# Patient Record
Sex: Female | Born: 1935 | Race: White | Hispanic: No | Smoking: Current some day smoker
Health system: Southern US, Community
[De-identification: ages and names within clinical notes are randomized; demographics above are authoritative.]

## PROBLEM LIST (undated history)

## (undated) DIAGNOSIS — F439 Reaction to severe stress, unspecified: Secondary | ICD-10-CM

## (undated) DIAGNOSIS — I739 Peripheral vascular disease, unspecified: Secondary | ICD-10-CM

## (undated) DIAGNOSIS — I10 Essential (primary) hypertension: Secondary | ICD-10-CM

## (undated) DIAGNOSIS — E785 Hyperlipidemia, unspecified: Secondary | ICD-10-CM

## (undated) DIAGNOSIS — M545 Low back pain: Secondary | ICD-10-CM

## (undated) DIAGNOSIS — I7090 Unspecified atherosclerosis: Secondary | ICD-10-CM

## (undated) HISTORY — DX: Peripheral vascular disease, unspecified: I73.9

## (undated) HISTORY — DX: Essential (primary) hypertension: I10

## (undated) HISTORY — DX: Hyperlipidemia, unspecified: E78.5

## (undated) HISTORY — PX: OTHER SURGICAL HISTORY: SHX169

## (undated) HISTORY — PX: BACK SURGERY: SHX140

## (undated) HISTORY — PX: NOSE SURGERY: SHX723

## (undated) HISTORY — DX: Unspecified atherosclerosis: I70.90

## (undated) HISTORY — DX: Low back pain: M54.5

## (undated) HISTORY — PX: ABDOMINAL HYSTERECTOMY: SHX81

## (undated) HISTORY — DX: Reaction to severe stress, unspecified: F43.9

---

## 2010-05-30 ENCOUNTER — Encounter (HOSPITAL_COMMUNITY)
Admission: RE | Admit: 2010-05-30 | Discharge: 2010-06-29 | Payer: Self-pay | Source: Home / Self Care | Attending: Neurosurgery | Admitting: Neurosurgery

## 2010-06-24 ENCOUNTER — Encounter: Payer: Self-pay | Admitting: Neurosurgery

## 2010-07-10 ENCOUNTER — Other Ambulatory Visit: Payer: Self-pay | Admitting: Neurosurgery

## 2010-07-10 DIAGNOSIS — R102 Pelvic and perineal pain: Secondary | ICD-10-CM

## 2010-07-10 DIAGNOSIS — M25551 Pain in right hip: Secondary | ICD-10-CM

## 2010-07-14 ENCOUNTER — Ambulatory Visit
Admission: RE | Admit: 2010-07-14 | Discharge: 2010-07-14 | Disposition: A | Discharge status: Home / Self Care | Payer: Medicare Other | Source: Ambulatory Visit | Attending: Neurosurgery | Admitting: Neurosurgery

## 2010-07-14 DIAGNOSIS — R102 Pelvic and perineal pain: Secondary | ICD-10-CM

## 2010-07-14 DIAGNOSIS — M25551 Pain in right hip: Secondary | ICD-10-CM

## 2010-07-14 MED ORDER — GADOBENATE DIMEGLUMINE 529 MG/ML IV SOLN
10.0000 mL | Freq: Once | INTRAVENOUS | Status: AC | PRN
  Administered 2010-07-14: 10 mL via INTRAVENOUS

## 2010-09-05 ENCOUNTER — Other Ambulatory Visit: Payer: Self-pay | Admitting: Neurosurgery

## 2010-09-05 DIAGNOSIS — M545 Low back pain: Secondary | ICD-10-CM

## 2010-09-05 DIAGNOSIS — M25559 Pain in unspecified hip: Secondary | ICD-10-CM

## 2010-09-11 ENCOUNTER — Ambulatory Visit
Admission: RE | Admit: 2010-09-11 | Discharge: 2010-09-11 | Disposition: A | Discharge status: Home / Self Care | Payer: Medicare Other | Source: Ambulatory Visit | Attending: Neurosurgery | Admitting: Neurosurgery

## 2010-09-11 DIAGNOSIS — M25559 Pain in unspecified hip: Secondary | ICD-10-CM

## 2010-09-11 DIAGNOSIS — M545 Low back pain: Secondary | ICD-10-CM

## 2010-10-26 ENCOUNTER — Ambulatory Visit (HOSPITAL_COMMUNITY)
Admission: RE | Admit: 2010-10-26 | Discharge: 2010-10-26 | Disposition: A | Discharge status: Home / Self Care | Payer: Medicare Other | Source: Ambulatory Visit | Attending: Neurosurgery | Admitting: Neurosurgery

## 2010-10-26 ENCOUNTER — Encounter (HOSPITAL_COMMUNITY)
Admission: RE | Admit: 2010-10-26 | Discharge: 2010-10-26 | Disposition: A | Discharge status: Home / Self Care | Payer: Medicare Other | Source: Ambulatory Visit | Attending: Neurosurgery | Admitting: Neurosurgery

## 2010-10-26 ENCOUNTER — Other Ambulatory Visit (HOSPITAL_COMMUNITY): Payer: Self-pay | Admitting: Neurosurgery

## 2010-10-26 DIAGNOSIS — J4489 Other specified chronic obstructive pulmonary disease: Secondary | ICD-10-CM | POA: Insufficient documentation

## 2010-10-26 DIAGNOSIS — M5136 Other intervertebral disc degeneration, lumbar region: Secondary | ICD-10-CM

## 2010-10-26 DIAGNOSIS — F17200 Nicotine dependence, unspecified, uncomplicated: Secondary | ICD-10-CM | POA: Insufficient documentation

## 2010-10-26 DIAGNOSIS — M51379 Other intervertebral disc degeneration, lumbosacral region without mention of lumbar back pain or lower extremity pain: Secondary | ICD-10-CM | POA: Insufficient documentation

## 2010-10-26 DIAGNOSIS — Z01818 Encounter for other preprocedural examination: Secondary | ICD-10-CM | POA: Insufficient documentation

## 2010-10-26 DIAGNOSIS — M949 Disorder of cartilage, unspecified: Secondary | ICD-10-CM | POA: Insufficient documentation

## 2010-10-26 DIAGNOSIS — M5137 Other intervertebral disc degeneration, lumbosacral region: Secondary | ICD-10-CM | POA: Insufficient documentation

## 2010-10-26 DIAGNOSIS — M899 Disorder of bone, unspecified: Secondary | ICD-10-CM | POA: Insufficient documentation

## 2010-10-26 DIAGNOSIS — Z01812 Encounter for preprocedural laboratory examination: Secondary | ICD-10-CM | POA: Insufficient documentation

## 2010-10-26 DIAGNOSIS — I10 Essential (primary) hypertension: Secondary | ICD-10-CM | POA: Insufficient documentation

## 2010-10-26 DIAGNOSIS — J449 Chronic obstructive pulmonary disease, unspecified: Secondary | ICD-10-CM | POA: Insufficient documentation

## 2010-10-26 LAB — BASIC METABOLIC PANEL
BUN: 14 mg/dL (ref 6–23)
CO2: 29 mEq/L (ref 19–32)
Calcium: 9.6 mg/dL (ref 8.4–10.5)
Chloride: 104 mEq/L (ref 96–112)
Sodium: 142 mEq/L (ref 135–145)

## 2010-10-26 LAB — CBC
Hemoglobin: 13.4 g/dL (ref 12.0–15.0)
MCH: 32.2 pg (ref 26.0–34.0)
MCV: 101.2 fL — ABNORMAL HIGH (ref 78.0–100.0)
RBC: 4.16 MIL/uL (ref 3.87–5.11)

## 2010-11-05 ENCOUNTER — Inpatient Hospital Stay (HOSPITAL_COMMUNITY)
Admission: RE | Admit: 2010-11-05 | Discharge: 2010-11-06 | DRG: 491 | Disposition: A | Discharge status: Home / Self Care | Payer: Medicare Other | Source: Ambulatory Visit | Attending: Neurosurgery | Admitting: Neurosurgery

## 2010-11-05 ENCOUNTER — Inpatient Hospital Stay (HOSPITAL_COMMUNITY): Payer: Medicare Other

## 2010-11-05 DIAGNOSIS — M48061 Spinal stenosis, lumbar region without neurogenic claudication: Principal | ICD-10-CM | POA: Diagnosis present

## 2010-11-05 DIAGNOSIS — F17200 Nicotine dependence, unspecified, uncomplicated: Secondary | ICD-10-CM | POA: Diagnosis present

## 2010-11-05 DIAGNOSIS — I10 Essential (primary) hypertension: Secondary | ICD-10-CM | POA: Diagnosis present

## 2010-11-05 DIAGNOSIS — J4489 Other specified chronic obstructive pulmonary disease: Secondary | ICD-10-CM | POA: Diagnosis present

## 2010-11-05 DIAGNOSIS — J449 Chronic obstructive pulmonary disease, unspecified: Secondary | ICD-10-CM | POA: Diagnosis present

## 2010-11-21 NOTE — Op Note (Signed)
Denise Mendoza, Denise Mendoza NO.:  [id number]  MEDICAL RECORD NO.:  [id number]  LOCATION:  3526                         FACILITY:  [facility]  PHYSICIAN:  Donalee Citrin, M.D.        DATE OF BIRTH:  [birth year]  DATE OF PROCEDURE: DATE OF DISCHARGE:                              OPERATIVE REPORT   PREOPERATIVE DIAGNOSIS:  Lumbar spinal stenosis with right-sided L4-5 radiculopathy.  PROCEDURE:  Decompressive laminectomy of L4-5 with microdissection of the right L4 and L5 nerve roots.  SURGEON:  Donalee Citrin, MD  ASSISTANT:  Kathaleen Maser. Pool, MD  ANESTHESIA:  General endotracheal.  HISTORY OF PRESENT ILLNESS:  The patient is a 77 year old female who has had progressive worsening right leg pain predominantly into her hip, buttock down the lateral and posterior thigh in the front of her shin, occasionally top of the foot consistent with both L4 and L5 nerve root pattern.  MRI scan showed lateral restenosis level.  The patient failed all forms of treatment, anti-inflammatories, physical therapy, epidural steroid injections and was recommended decompression, foraminotomies at this level and the risks and benefits of the operation were explained to the patient.  She understood and agreed to proceed forth.  The patient was brought to the OR, was induced general anesthesia.  She was placed prone on the Wilson frame and her back was prepped and draped in usual fashion.  Preop incision was localized at the appropriate level.  So after infiltration of 7 mL of lidocaine with epi, midline incision was made.  Bovie electrocautery was used to take down subcutaneous tissue.  Subperiosteal dissection carried out at lamina of L4 and L5.  Intraoperative x-ray identified the appropriate level so the inferior aspect of L4 medial facet complex and superior aspect of L5 was drilled down with a high-speed drill.  Laminotomy was begun with a 2 and 3-mm Kerrison punch.  There was marked facet  arthropathy and large osteophyte displacing the L5 nerve root.  This was all teased away and bitten away with a similar Kerrison punch decompressing and unroofing the L5 nerve root flushed with pedicle.  The foramen was further unroofed decompressing that nerve root marching superiorly.  Working in the lateral recess, the L4 nerve root was identified, the L4 pedicle was palpated.  There was marked ligamentous hypertrophy displacing the undersurface of the L4 nerve root  This was all teased off from the L4 root with a nerve hook and then removed in a piecemeal fashion with 2 and 3-mm Kerrison punch.  At the end of the foraminotomy, there was no further compression easily accepting hockey stick and coronary dilator down the foramen.  Wound was then copiously irrigated and meticulous hemostasis was maintained.  Gelfoam was overlaid on top of the dura.  The muscle fascia was reapproximated in layers with interrupted Vicryl, and the skin was closed with running 4-0 subcuticular.  Benzoin and Steri-Strips were applied.  The patient went to recovery room in stable condition.          ______________________________ Donalee Citrin, M.D.     GC/MEDQ  D:  11/05/2010  T:  11/05/2010  Job:  [id number]  Electronically Signed by Harrell Lark.D.  on 11/21/2010 08:43:00 AM

## 2010-11-29 NOTE — Discharge Summary (Signed)
  NAMECHARDAY, CAPETILLO NO.:  [id number]  MEDICAL RECORD NO.:  [id number]  LOCATION:  3526                         FACILITY:  [facility]  PHYSICIAN:  Donalee Citrin, M.D.        DATE OF BIRTH:  1932-03-24  DATE OF ADMISSION:  11/05/2010 DATE OF DISCHARGE:  11/06/2010                              DISCHARGE SUMMARY   ADMITTING DIAGNOSIS:  Degenerative disease and lumbar spondylosis with a right L4-L5 radiculopathy.  PROCEDURES: 1. Status post laminectomy and diskectomy. 2. Decompression of the L4 and L5 nerve roots on the right.  HOSPITAL COURSE:  The patient was admitted and immediately went to the operating room and underwent the aforementioned procedure.  Postop, the patient did very well, went to recovery room, and then to the floor.  On the floor, the patient convalesced well.  By her postoperative day #1, was ambulating, voiding spontaneously, pain was well controlled, leg pain was gone, and she is able to be discharged home with approximately 1 week followup.  At the time of discharge, the patient was tolerating regular diet and voiding spontaneously.          ______________________________ Donalee Citrin, M.D.     GC/MEDQ  D:  11/21/2010  T:  11/21/2010  Job:  [id number]  Electronically Signed by Donalee Citrin M.D. on 11/29/2010 03:03:33 PM

## 2011-05-21 ENCOUNTER — Other Ambulatory Visit (HOSPITAL_COMMUNITY): Payer: Self-pay | Admitting: Neurosurgery

## 2011-05-21 DIAGNOSIS — M545 Low back pain: Secondary | ICD-10-CM

## 2011-05-23 ENCOUNTER — Ambulatory Visit (HOSPITAL_COMMUNITY)
Admission: RE | Admit: 2011-05-23 | Discharge: 2011-05-23 | Disposition: A | Discharge status: Home / Self Care | Payer: Medicare Other | Source: Ambulatory Visit | Attending: Neurosurgery | Admitting: Neurosurgery

## 2011-05-23 DIAGNOSIS — M545 Low back pain, unspecified: Secondary | ICD-10-CM | POA: Insufficient documentation

## 2011-05-23 DIAGNOSIS — M5137 Other intervertebral disc degeneration, lumbosacral region: Secondary | ICD-10-CM | POA: Insufficient documentation

## 2011-05-23 DIAGNOSIS — M51379 Other intervertebral disc degeneration, lumbosacral region without mention of lumbar back pain or lower extremity pain: Secondary | ICD-10-CM | POA: Insufficient documentation

## 2011-05-23 MED ORDER — GADOBENATE DIMEGLUMINE 529 MG/ML IV SOLN
10.0000 mL | Freq: Once | INTRAVENOUS | Status: AC | PRN
  Administered 2011-05-23: 10 mL via INTRAVENOUS

## 2011-10-10 ENCOUNTER — Other Ambulatory Visit: Payer: Self-pay | Admitting: Neurosurgery

## 2011-10-10 DIAGNOSIS — M545 Low back pain: Secondary | ICD-10-CM

## 2011-10-19 ENCOUNTER — Ambulatory Visit
Admission: RE | Admit: 2011-10-19 | Discharge: 2011-10-19 | Disposition: A | Discharge status: Home / Self Care | Payer: Medicare Other | Source: Ambulatory Visit | Attending: Neurosurgery | Admitting: Neurosurgery

## 2011-10-19 DIAGNOSIS — M545 Low back pain: Secondary | ICD-10-CM

## 2012-03-03 DIAGNOSIS — F439 Reaction to severe stress, unspecified: Secondary | ICD-10-CM

## 2012-03-03 DIAGNOSIS — M545 Low back pain, unspecified: Secondary | ICD-10-CM

## 2012-03-03 HISTORY — DX: Low back pain, unspecified: M54.50

## 2012-03-03 HISTORY — DX: Reaction to severe stress, unspecified: F43.9

## 2012-04-01 ENCOUNTER — Encounter (INDEPENDENT_AMBULATORY_CARE_PROVIDER_SITE_OTHER): Payer: Medicare Other | Admitting: *Deleted

## 2012-04-01 DIAGNOSIS — M79609 Pain in unspecified limb: Secondary | ICD-10-CM

## 2012-04-16 ENCOUNTER — Other Ambulatory Visit: Payer: Self-pay | Admitting: *Deleted

## 2012-04-16 DIAGNOSIS — I739 Peripheral vascular disease, unspecified: Secondary | ICD-10-CM

## 2012-04-24 ENCOUNTER — Encounter: Payer: Self-pay | Admitting: Vascular Surgery

## 2012-05-11 ENCOUNTER — Encounter: Payer: Self-pay | Admitting: Vascular Surgery

## 2012-05-12 ENCOUNTER — Encounter: Payer: Self-pay | Admitting: Vascular Surgery

## 2012-05-12 ENCOUNTER — Encounter (INDEPENDENT_AMBULATORY_CARE_PROVIDER_SITE_OTHER): Payer: Medicare Other | Admitting: *Deleted

## 2012-05-12 ENCOUNTER — Ambulatory Visit (INDEPENDENT_AMBULATORY_CARE_PROVIDER_SITE_OTHER): Payer: Medicare Other | Admitting: Vascular Surgery

## 2012-05-12 VITALS — BP 145/69 | HR 75 | Resp 18 | Ht 61.5 in | Wt 102.0 lb

## 2012-05-12 DIAGNOSIS — I739 Peripheral vascular disease, unspecified: Secondary | ICD-10-CM

## 2012-05-12 DIAGNOSIS — M79604 Pain in right leg: Secondary | ICD-10-CM | POA: Insufficient documentation

## 2012-05-12 DIAGNOSIS — M25559 Pain in unspecified hip: Secondary | ICD-10-CM | POA: Insufficient documentation

## 2012-05-12 DIAGNOSIS — M79605 Pain in left leg: Secondary | ICD-10-CM | POA: Insufficient documentation

## 2012-05-12 DIAGNOSIS — M79609 Pain in unspecified limb: Secondary | ICD-10-CM

## 2012-05-12 NOTE — Progress Notes (Signed)
[facility]  HISTORY AND PHYSICAL   CC: Bilateral hip and leg pain Referring Physician: Dr. Wynetta Emery  History of Present Illness: LANESHIA PINA is a 78 y.o. female with history of LBP, L 4-5 lumbar decompression and multiple steroid injections to Lumbar region by Dr Wynetta Emery on 11/05/10 who continues to C/O Bilateral leg and hip pain. Pt states this occurs with pressure on one hip or the other and radiates down her legs. She will get pain when she rises from prone position to walk. She denies claudication, night or rest pain. She is here today for vascular lab studies and exam to R/O PVD.    No results found.  Past Medical History  Diagnosis Date  . Occlusive disease, arterial   . Peripheral vascular disease   . Stress Oct. 2013    Dr. Ignatius Specking  . Lower back pain Oct 2013  . Hyperlipidemia   . Hypertension     ROS: [x]  Positive   [ ]  Denies    General: [ ]  Weight loss, [ ]  Fever, [ ]  chills Neurologic: [ ]  Dizziness, [ ]  Blackouts, [ ]  Seizure [ ]  Stroke, [ ]  "Mini stroke", [ ]  Slurred speech, [ ]  Temporary blindness; [ ]  weakness in arms or legs, [ ]  Hoarseness Cardiac: [ ]  Chest pain/pressure, [ ]  Shortness of breath at rest [ ]  Shortness of breath with exertion, [ ]  Atrial fibrillation or irregular heartbeat Vascular: [x ] Pain in legs with walking, [ ]  Pain in legs at rest, [ ]  Pain in legs at night,  [ ]  Non-healing ulcer, [ ]  Blood clot in vein/DVT,   Pulmonary: [ ]  Home oxygen, [ ]  Productive cough, [ ]  Coughing up blood, [ ]  Asthma,  [ ]  Wheezing Musculoskeletal:  [ ]  Arthritis, [x ] Low back pain, [x ] Joint pain Hematologic: [ ]  Easy Bruising, [ ]  Anemia; [ ]  Hepatitis Gastrointestinal: [ ]  Blood in stool, [ ]  Gastroesophageal Reflux/heartburn, [ ]  Trouble swallowing Urinary: [ ]  chronic Kidney disease, [ ]  on HD - [ ]  MWF or [ ]  TTHS, [ ]  Burning with urination, [ ]  Difficulty urinating Skin: [ ]  Rashes, [ ]  Wounds Psychological: [ ]   Anxiety, [ ]  Depression   Social History History  Substance Use Topics  . Smoking status: Current Every Day Smoker -- 1.0 packs/day for 30 years    Types: Cigarettes  . Smokeless tobacco: Never Used  . Alcohol Use: No    Family History Family History  Problem Relation Age of Onset  . Heart disease Mother   . Other Father     MACULARDEGENERATION    No Known Allergies  Current Outpatient Prescriptions  Medication Sig Dispense Refill  . ALPRAZolam (XANAX) 0.25 MG tablet Take 1 mg by mouth at bedtime as needed.      . Cholecalciferol 2000 UNITS TABS Take by mouth.      Marland Kitchen HYDROcodone-acetaminophen (LORTAB) 7.5-500 MG per tablet Take 1 tablet by mouth every 6 (six) hours as needed.      . metoprolol tartrate (LOPRESSOR) 25 MG tablet Take 25 mg by mouth 2 (two) times daily.      . simvastatin (ZOCOR) 40 MG tablet Take 40 mg by mouth every evening.      . gabapentin (NEURONTIN) 300 MG capsule Take 300 mg by mouth 3 (three) times daily.      . traZODone (DESYREL) 50 MG tablet Take 50 mg by mouth at bedtime.      Marland Kitchen  zoledronic acid (RECLAST) 5 MG/100ML SOLN Inject 5 mg into the vein once.        Physical Examination  Filed Vitals:   05/12/12 1344  BP: 145/69  Pulse: 75  Resp: 18    Body mass index is 18.96 kg/(m^2).  General:  WDWN in NAD Gait: Normal HENT: WNL Eyes: Pupils equal Pulmonary: normal non-labored breathing , without Rales, rhonchi,  wheezing Cardiac: RRR, without  Murmurs, rubs or gallops; No carotid bruits noted bilat. Abdomen: soft, NT, no masses Skin: no rashes, ulcers noted Vascular Exam/Pulses: 2+ palpable Femoral, popliteal, PT bilaterally   Extremities without ischemic changes, no Gangrene , no cellulitis; no open wounds;  Musculoskeletal: no muscle wasting or atrophy  Neurologic: A&O X 3; Appropriate Affect ; SENSATION: normal; MOTOR FUNCTION:  moving all extremities equally. Speech is fluent/normal  Non-Invasive Vascular Imaging: ABI's 05/12/12  > 1.0 bilaterally with triphasic flow in DP and PT arteries  04/01/12 arterial duplex: widely patent RLE/LLE arterial systems <50% mid- distal left SFA stenosis Turbulent flow in distal EIA may be suggestive of more proximal disease  ASSESSMENT: CHRYSTEL BAREFIELD is a 78 y.o. female, smoker with normal flow in arterial system in BLE per arterial duplex, ABI's and exam. Back, Hip and leg pain appear to be secondary to lumbar stenosis  PLAN: Encourage pt to quit smoking F/U prn Return to Dr Wynetta Emery for further W/U

## 2015-07-06 DIAGNOSIS — H353222 Exudative age-related macular degeneration, left eye, with inactive choroidal neovascularization: Secondary | ICD-10-CM | POA: Diagnosis not present

## 2015-07-06 DIAGNOSIS — H35372 Puckering of macula, left eye: Secondary | ICD-10-CM | POA: Diagnosis not present

## 2015-07-06 DIAGNOSIS — H31019 Macula scars of posterior pole (postinflammatory) (post-traumatic), unspecified eye: Secondary | ICD-10-CM | POA: Diagnosis not present

## 2015-07-06 DIAGNOSIS — H353134 Nonexudative age-related macular degeneration, bilateral, advanced atrophic with subfoveal involvement: Secondary | ICD-10-CM | POA: Diagnosis not present

## 2015-08-01 DIAGNOSIS — F17200 Nicotine dependence, unspecified, uncomplicated: Secondary | ICD-10-CM | POA: Diagnosis not present

## 2015-08-01 DIAGNOSIS — I10 Essential (primary) hypertension: Secondary | ICD-10-CM | POA: Diagnosis not present

## 2015-08-01 DIAGNOSIS — G8929 Other chronic pain: Secondary | ICD-10-CM | POA: Diagnosis not present

## 2015-08-01 DIAGNOSIS — M545 Low back pain: Secondary | ICD-10-CM | POA: Diagnosis not present

## 2015-08-10 DIAGNOSIS — E7800 Pure hypercholesterolemia, unspecified: Secondary | ICD-10-CM | POA: Diagnosis not present

## 2015-08-10 DIAGNOSIS — I10 Essential (primary) hypertension: Secondary | ICD-10-CM | POA: Diagnosis not present

## 2015-09-19 DIAGNOSIS — I10 Essential (primary) hypertension: Secondary | ICD-10-CM | POA: Diagnosis not present

## 2015-09-19 DIAGNOSIS — E7800 Pure hypercholesterolemia, unspecified: Secondary | ICD-10-CM | POA: Diagnosis not present

## 2015-09-28 DIAGNOSIS — M545 Low back pain: Secondary | ICD-10-CM | POA: Diagnosis not present

## 2015-09-28 DIAGNOSIS — Z7189 Other specified counseling: Secondary | ICD-10-CM | POA: Diagnosis not present

## 2015-09-28 DIAGNOSIS — G8929 Other chronic pain: Secondary | ICD-10-CM | POA: Diagnosis not present

## 2015-09-28 DIAGNOSIS — E7800 Pure hypercholesterolemia, unspecified: Secondary | ICD-10-CM | POA: Diagnosis not present

## 2015-09-28 DIAGNOSIS — Z1389 Encounter for screening for other disorder: Secondary | ICD-10-CM | POA: Diagnosis not present

## 2015-09-28 DIAGNOSIS — Z1211 Encounter for screening for malignant neoplasm of colon: Secondary | ICD-10-CM | POA: Diagnosis not present

## 2015-09-28 DIAGNOSIS — R5383 Other fatigue: Secondary | ICD-10-CM | POA: Diagnosis not present

## 2015-09-28 DIAGNOSIS — Z299 Encounter for prophylactic measures, unspecified: Secondary | ICD-10-CM | POA: Diagnosis not present

## 2015-09-28 DIAGNOSIS — Z0000 Encounter for general adult medical examination without abnormal findings: Secondary | ICD-10-CM | POA: Diagnosis not present

## 2015-09-28 DIAGNOSIS — Z79899 Other long term (current) drug therapy: Secondary | ICD-10-CM | POA: Diagnosis not present

## 2015-10-17 DIAGNOSIS — I10 Essential (primary) hypertension: Secondary | ICD-10-CM | POA: Diagnosis not present

## 2015-10-17 DIAGNOSIS — E7800 Pure hypercholesterolemia, unspecified: Secondary | ICD-10-CM | POA: Diagnosis not present

## 2015-11-09 DIAGNOSIS — I10 Essential (primary) hypertension: Secondary | ICD-10-CM | POA: Diagnosis not present

## 2015-11-09 DIAGNOSIS — E7800 Pure hypercholesterolemia, unspecified: Secondary | ICD-10-CM | POA: Diagnosis not present

## 2015-11-13 DIAGNOSIS — I340 Nonrheumatic mitral (valve) insufficiency: Secondary | ICD-10-CM | POA: Diagnosis not present

## 2015-11-13 DIAGNOSIS — I360 Nonrheumatic tricuspid (valve) stenosis: Secondary | ICD-10-CM | POA: Diagnosis not present

## 2015-11-13 DIAGNOSIS — I350 Nonrheumatic aortic (valve) stenosis: Secondary | ICD-10-CM | POA: Diagnosis not present

## 2015-11-28 DIAGNOSIS — M545 Low back pain: Secondary | ICD-10-CM | POA: Diagnosis not present

## 2015-12-15 DIAGNOSIS — M069 Rheumatoid arthritis, unspecified: Secondary | ICD-10-CM | POA: Diagnosis not present

## 2015-12-15 DIAGNOSIS — I10 Essential (primary) hypertension: Secondary | ICD-10-CM | POA: Diagnosis not present

## 2015-12-15 DIAGNOSIS — E7800 Pure hypercholesterolemia, unspecified: Secondary | ICD-10-CM | POA: Diagnosis not present

## 2016-01-18 DIAGNOSIS — I10 Essential (primary) hypertension: Secondary | ICD-10-CM | POA: Diagnosis not present

## 2016-01-18 DIAGNOSIS — E7800 Pure hypercholesterolemia, unspecified: Secondary | ICD-10-CM | POA: Diagnosis not present

## 2016-01-18 DIAGNOSIS — M069 Rheumatoid arthritis, unspecified: Secondary | ICD-10-CM | POA: Diagnosis not present

## 2016-01-25 DIAGNOSIS — G4700 Insomnia, unspecified: Secondary | ICD-10-CM | POA: Diagnosis not present

## 2016-01-25 DIAGNOSIS — M545 Low back pain: Secondary | ICD-10-CM | POA: Diagnosis not present

## 2016-01-25 DIAGNOSIS — Z6823 Body mass index (BMI) 23.0-23.9, adult: Secondary | ICD-10-CM | POA: Diagnosis not present

## 2016-01-25 DIAGNOSIS — G8929 Other chronic pain: Secondary | ICD-10-CM | POA: Diagnosis not present

## 2016-03-17 DIAGNOSIS — E876 Hypokalemia: Secondary | ICD-10-CM | POA: Diagnosis not present

## 2016-03-17 DIAGNOSIS — E785 Hyperlipidemia, unspecified: Secondary | ICD-10-CM | POA: Diagnosis not present

## 2016-03-17 DIAGNOSIS — N201 Calculus of ureter: Secondary | ICD-10-CM | POA: Diagnosis present

## 2016-03-17 DIAGNOSIS — F419 Anxiety disorder, unspecified: Secondary | ICD-10-CM | POA: Diagnosis not present

## 2016-03-17 DIAGNOSIS — G629 Polyneuropathy, unspecified: Secondary | ICD-10-CM | POA: Diagnosis not present

## 2016-03-17 DIAGNOSIS — M5489 Other dorsalgia: Secondary | ICD-10-CM | POA: Diagnosis present

## 2016-03-17 DIAGNOSIS — H3530 Unspecified macular degeneration: Secondary | ICD-10-CM | POA: Diagnosis not present

## 2016-03-17 DIAGNOSIS — R002 Palpitations: Secondary | ICD-10-CM | POA: Diagnosis present

## 2016-03-17 DIAGNOSIS — N211 Calculus in urethra: Secondary | ICD-10-CM | POA: Diagnosis not present

## 2016-03-17 DIAGNOSIS — R278 Other lack of coordination: Secondary | ICD-10-CM | POA: Diagnosis not present

## 2016-03-17 DIAGNOSIS — N132 Hydronephrosis with renal and ureteral calculous obstruction: Secondary | ICD-10-CM | POA: Diagnosis present

## 2016-03-17 DIAGNOSIS — A419 Sepsis, unspecified organism: Secondary | ICD-10-CM | POA: Diagnosis not present

## 2016-03-17 DIAGNOSIS — M1990 Unspecified osteoarthritis, unspecified site: Secondary | ICD-10-CM | POA: Diagnosis not present

## 2016-03-17 DIAGNOSIS — M6281 Muscle weakness (generalized): Secondary | ICD-10-CM | POA: Diagnosis not present

## 2016-03-17 DIAGNOSIS — R6521 Severe sepsis with septic shock: Secondary | ICD-10-CM | POA: Diagnosis present

## 2016-03-17 DIAGNOSIS — Z955 Presence of coronary angioplasty implant and graft: Secondary | ICD-10-CM | POA: Diagnosis not present

## 2016-03-17 DIAGNOSIS — F5104 Psychophysiologic insomnia: Secondary | ICD-10-CM | POA: Diagnosis not present

## 2016-03-17 DIAGNOSIS — R079 Chest pain, unspecified: Secondary | ICD-10-CM | POA: Diagnosis not present

## 2016-03-17 DIAGNOSIS — R000 Tachycardia, unspecified: Secondary | ICD-10-CM | POA: Diagnosis present

## 2016-03-17 DIAGNOSIS — R109 Unspecified abdominal pain: Secondary | ICD-10-CM | POA: Diagnosis not present

## 2016-03-17 DIAGNOSIS — R918 Other nonspecific abnormal finding of lung field: Secondary | ICD-10-CM | POA: Diagnosis not present

## 2016-03-17 DIAGNOSIS — G8929 Other chronic pain: Secondary | ICD-10-CM | POA: Diagnosis present

## 2016-03-17 DIAGNOSIS — B9620 Unspecified Escherichia coli [E. coli] as the cause of diseases classified elsewhere: Secondary | ICD-10-CM | POA: Diagnosis present

## 2016-03-17 DIAGNOSIS — Z960 Presence of urogenital implants: Secondary | ICD-10-CM | POA: Diagnosis not present

## 2016-03-17 DIAGNOSIS — I712 Thoracic aortic aneurysm, without rupture: Secondary | ICD-10-CM | POA: Diagnosis not present

## 2016-03-17 DIAGNOSIS — R0602 Shortness of breath: Secondary | ICD-10-CM | POA: Diagnosis not present

## 2016-03-17 DIAGNOSIS — N390 Urinary tract infection, site not specified: Secondary | ICD-10-CM | POA: Diagnosis not present

## 2016-03-17 DIAGNOSIS — K567 Ileus, unspecified: Secondary | ICD-10-CM | POA: Diagnosis not present

## 2016-03-17 DIAGNOSIS — M549 Dorsalgia, unspecified: Secondary | ICD-10-CM | POA: Diagnosis not present

## 2016-03-17 DIAGNOSIS — N135 Crossing vessel and stricture of ureter without hydronephrosis: Secondary | ICD-10-CM | POA: Diagnosis not present

## 2016-03-17 DIAGNOSIS — Q625 Duplication of ureter: Secondary | ICD-10-CM | POA: Diagnosis not present

## 2016-03-17 DIAGNOSIS — A4151 Sepsis due to Escherichia coli [E. coli]: Secondary | ICD-10-CM | POA: Diagnosis present

## 2016-03-17 DIAGNOSIS — I509 Heart failure, unspecified: Secondary | ICD-10-CM | POA: Diagnosis not present

## 2016-03-17 DIAGNOSIS — I10 Essential (primary) hypertension: Secondary | ICD-10-CM | POA: Diagnosis not present

## 2016-03-22 DIAGNOSIS — M810 Age-related osteoporosis without current pathological fracture: Secondary | ICD-10-CM | POA: Diagnosis not present

## 2016-03-22 DIAGNOSIS — R278 Other lack of coordination: Secondary | ICD-10-CM | POA: Diagnosis not present

## 2016-03-22 DIAGNOSIS — G629 Polyneuropathy, unspecified: Secondary | ICD-10-CM | POA: Diagnosis not present

## 2016-03-22 DIAGNOSIS — M069 Rheumatoid arthritis, unspecified: Secondary | ICD-10-CM | POA: Diagnosis not present

## 2016-03-22 DIAGNOSIS — I712 Thoracic aortic aneurysm, without rupture: Secondary | ICD-10-CM | POA: Diagnosis not present

## 2016-03-22 DIAGNOSIS — Q625 Duplication of ureter: Secondary | ICD-10-CM | POA: Diagnosis not present

## 2016-03-22 DIAGNOSIS — Z79899 Other long term (current) drug therapy: Secondary | ICD-10-CM | POA: Diagnosis not present

## 2016-03-22 DIAGNOSIS — M6281 Muscle weakness (generalized): Secondary | ICD-10-CM | POA: Diagnosis not present

## 2016-03-22 DIAGNOSIS — M549 Dorsalgia, unspecified: Secondary | ICD-10-CM | POA: Diagnosis not present

## 2016-03-22 DIAGNOSIS — A419 Sepsis, unspecified organism: Secondary | ICD-10-CM | POA: Diagnosis not present

## 2016-03-22 DIAGNOSIS — F17200 Nicotine dependence, unspecified, uncomplicated: Secondary | ICD-10-CM | POA: Diagnosis not present

## 2016-03-22 DIAGNOSIS — Z960 Presence of urogenital implants: Secondary | ICD-10-CM | POA: Diagnosis not present

## 2016-03-22 DIAGNOSIS — E876 Hypokalemia: Secondary | ICD-10-CM | POA: Diagnosis not present

## 2016-03-22 DIAGNOSIS — A4151 Sepsis due to Escherichia coli [E. coli]: Secondary | ICD-10-CM | POA: Diagnosis not present

## 2016-03-22 DIAGNOSIS — E7800 Pure hypercholesterolemia, unspecified: Secondary | ICD-10-CM | POA: Diagnosis not present

## 2016-03-22 DIAGNOSIS — J449 Chronic obstructive pulmonary disease, unspecified: Secondary | ICD-10-CM | POA: Diagnosis not present

## 2016-03-22 DIAGNOSIS — B9620 Unspecified Escherichia coli [E. coli] as the cause of diseases classified elsewhere: Secondary | ICD-10-CM | POA: Diagnosis not present

## 2016-03-22 DIAGNOSIS — Z90710 Acquired absence of both cervix and uterus: Secondary | ICD-10-CM | POA: Diagnosis not present

## 2016-03-22 DIAGNOSIS — F5104 Psychophysiologic insomnia: Secondary | ICD-10-CM | POA: Diagnosis not present

## 2016-03-22 DIAGNOSIS — M5136 Other intervertebral disc degeneration, lumbar region: Secondary | ICD-10-CM | POA: Diagnosis not present

## 2016-03-22 DIAGNOSIS — E785 Hyperlipidemia, unspecified: Secondary | ICD-10-CM | POA: Diagnosis not present

## 2016-03-22 DIAGNOSIS — Z87442 Personal history of urinary calculi: Secondary | ICD-10-CM | POA: Diagnosis not present

## 2016-03-22 DIAGNOSIS — F419 Anxiety disorder, unspecified: Secondary | ICD-10-CM | POA: Diagnosis not present

## 2016-03-22 DIAGNOSIS — M1990 Unspecified osteoarthritis, unspecified site: Secondary | ICD-10-CM | POA: Diagnosis not present

## 2016-03-22 DIAGNOSIS — H3530 Unspecified macular degeneration: Secondary | ICD-10-CM | POA: Diagnosis not present

## 2016-03-22 DIAGNOSIS — H548 Legal blindness, as defined in USA: Secondary | ICD-10-CM | POA: Diagnosis not present

## 2016-03-22 DIAGNOSIS — I10 Essential (primary) hypertension: Secondary | ICD-10-CM | POA: Diagnosis not present

## 2016-03-22 DIAGNOSIS — R6521 Severe sepsis with septic shock: Secondary | ICD-10-CM | POA: Diagnosis not present

## 2016-03-22 DIAGNOSIS — Z8249 Family history of ischemic heart disease and other diseases of the circulatory system: Secondary | ICD-10-CM | POA: Diagnosis not present

## 2016-03-22 DIAGNOSIS — Z7982 Long term (current) use of aspirin: Secondary | ICD-10-CM | POA: Diagnosis not present

## 2016-03-22 DIAGNOSIS — G8929 Other chronic pain: Secondary | ICD-10-CM | POA: Diagnosis not present

## 2016-03-22 DIAGNOSIS — A409 Streptococcal sepsis, unspecified: Secondary | ICD-10-CM | POA: Diagnosis not present

## 2016-03-22 DIAGNOSIS — N390 Urinary tract infection, site not specified: Secondary | ICD-10-CM | POA: Diagnosis not present

## 2016-03-22 DIAGNOSIS — N201 Calculus of ureter: Secondary | ICD-10-CM | POA: Diagnosis not present

## 2016-03-25 DIAGNOSIS — A4151 Sepsis due to Escherichia coli [E. coli]: Secondary | ICD-10-CM | POA: Diagnosis not present

## 2016-03-25 DIAGNOSIS — N390 Urinary tract infection, site not specified: Secondary | ICD-10-CM | POA: Diagnosis not present

## 2016-03-26 DIAGNOSIS — Q625 Duplication of ureter: Secondary | ICD-10-CM | POA: Diagnosis not present

## 2016-03-26 DIAGNOSIS — N201 Calculus of ureter: Secondary | ICD-10-CM | POA: Diagnosis not present

## 2016-03-27 DIAGNOSIS — E7800 Pure hypercholesterolemia, unspecified: Secondary | ICD-10-CM | POA: Diagnosis not present

## 2016-03-27 DIAGNOSIS — Z7982 Long term (current) use of aspirin: Secondary | ICD-10-CM | POA: Diagnosis not present

## 2016-03-27 DIAGNOSIS — Z87442 Personal history of urinary calculi: Secondary | ICD-10-CM | POA: Diagnosis not present

## 2016-03-27 DIAGNOSIS — F17200 Nicotine dependence, unspecified, uncomplicated: Secondary | ICD-10-CM | POA: Diagnosis not present

## 2016-03-27 DIAGNOSIS — M069 Rheumatoid arthritis, unspecified: Secondary | ICD-10-CM | POA: Diagnosis not present

## 2016-03-27 DIAGNOSIS — J449 Chronic obstructive pulmonary disease, unspecified: Secondary | ICD-10-CM | POA: Diagnosis not present

## 2016-03-27 DIAGNOSIS — N201 Calculus of ureter: Secondary | ICD-10-CM | POA: Diagnosis not present

## 2016-03-27 DIAGNOSIS — I10 Essential (primary) hypertension: Secondary | ICD-10-CM | POA: Diagnosis not present

## 2016-03-27 DIAGNOSIS — Z79899 Other long term (current) drug therapy: Secondary | ICD-10-CM | POA: Diagnosis not present

## 2016-03-27 DIAGNOSIS — Z960 Presence of urogenital implants: Secondary | ICD-10-CM | POA: Diagnosis not present

## 2016-03-27 DIAGNOSIS — Z90710 Acquired absence of both cervix and uterus: Secondary | ICD-10-CM | POA: Diagnosis not present

## 2016-03-27 DIAGNOSIS — M1990 Unspecified osteoarthritis, unspecified site: Secondary | ICD-10-CM | POA: Diagnosis not present

## 2016-03-27 DIAGNOSIS — Q625 Duplication of ureter: Secondary | ICD-10-CM | POA: Diagnosis not present

## 2016-04-01 DIAGNOSIS — B9620 Unspecified Escherichia coli [E. coli] as the cause of diseases classified elsewhere: Secondary | ICD-10-CM | POA: Diagnosis not present

## 2016-04-01 DIAGNOSIS — N390 Urinary tract infection, site not specified: Secondary | ICD-10-CM | POA: Diagnosis not present

## 2016-04-01 DIAGNOSIS — M5136 Other intervertebral disc degeneration, lumbar region: Secondary | ICD-10-CM | POA: Diagnosis not present

## 2016-04-01 DIAGNOSIS — A409 Streptococcal sepsis, unspecified: Secondary | ICD-10-CM | POA: Diagnosis not present

## 2016-04-05 DIAGNOSIS — N390 Urinary tract infection, site not specified: Secondary | ICD-10-CM | POA: Diagnosis not present

## 2016-04-05 DIAGNOSIS — F419 Anxiety disorder, unspecified: Secondary | ICD-10-CM | POA: Diagnosis not present

## 2016-04-05 DIAGNOSIS — Z09 Encounter for follow-up examination after completed treatment for conditions other than malignant neoplasm: Secondary | ICD-10-CM | POA: Diagnosis not present

## 2016-04-05 DIAGNOSIS — Z299 Encounter for prophylactic measures, unspecified: Secondary | ICD-10-CM | POA: Diagnosis not present

## 2016-04-05 DIAGNOSIS — M545 Low back pain: Secondary | ICD-10-CM | POA: Diagnosis not present

## 2016-05-03 DIAGNOSIS — M545 Low back pain: Secondary | ICD-10-CM | POA: Diagnosis not present

## 2016-05-03 DIAGNOSIS — Z713 Dietary counseling and surveillance: Secondary | ICD-10-CM | POA: Diagnosis not present

## 2016-05-03 DIAGNOSIS — Z299 Encounter for prophylactic measures, unspecified: Secondary | ICD-10-CM | POA: Diagnosis not present

## 2016-05-03 DIAGNOSIS — Z6822 Body mass index (BMI) 22.0-22.9, adult: Secondary | ICD-10-CM | POA: Diagnosis not present

## 2016-05-07 DIAGNOSIS — I10 Essential (primary) hypertension: Secondary | ICD-10-CM | POA: Diagnosis not present

## 2016-05-07 DIAGNOSIS — M069 Rheumatoid arthritis, unspecified: Secondary | ICD-10-CM | POA: Diagnosis not present

## 2016-05-07 DIAGNOSIS — E7800 Pure hypercholesterolemia, unspecified: Secondary | ICD-10-CM | POA: Diagnosis not present

## 2016-05-31 DIAGNOSIS — M545 Low back pain: Secondary | ICD-10-CM | POA: Diagnosis not present

## 2016-05-31 DIAGNOSIS — Z299 Encounter for prophylactic measures, unspecified: Secondary | ICD-10-CM | POA: Diagnosis not present

## 2016-05-31 DIAGNOSIS — Z6822 Body mass index (BMI) 22.0-22.9, adult: Secondary | ICD-10-CM | POA: Diagnosis not present

## 2016-05-31 DIAGNOSIS — Z713 Dietary counseling and surveillance: Secondary | ICD-10-CM | POA: Diagnosis not present

## 2016-06-05 DIAGNOSIS — E7800 Pure hypercholesterolemia, unspecified: Secondary | ICD-10-CM | POA: Diagnosis not present

## 2016-06-05 DIAGNOSIS — M069 Rheumatoid arthritis, unspecified: Secondary | ICD-10-CM | POA: Diagnosis not present

## 2016-06-05 DIAGNOSIS — I10 Essential (primary) hypertension: Secondary | ICD-10-CM | POA: Diagnosis not present

## 2016-06-28 DIAGNOSIS — Q625 Duplication of ureter: Secondary | ICD-10-CM | POA: Diagnosis not present

## 2016-06-28 DIAGNOSIS — N201 Calculus of ureter: Secondary | ICD-10-CM | POA: Diagnosis not present

## 2016-06-28 DIAGNOSIS — R3915 Urgency of urination: Secondary | ICD-10-CM | POA: Diagnosis not present

## 2016-07-01 DIAGNOSIS — Z6822 Body mass index (BMI) 22.0-22.9, adult: Secondary | ICD-10-CM | POA: Diagnosis not present

## 2016-07-01 DIAGNOSIS — Z299 Encounter for prophylactic measures, unspecified: Secondary | ICD-10-CM | POA: Diagnosis not present

## 2016-07-01 DIAGNOSIS — Z713 Dietary counseling and surveillance: Secondary | ICD-10-CM | POA: Diagnosis not present

## 2016-07-01 DIAGNOSIS — M545 Low back pain: Secondary | ICD-10-CM | POA: Diagnosis not present

## 2016-07-19 DIAGNOSIS — Z6827 Body mass index (BMI) 27.0-27.9, adult: Secondary | ICD-10-CM | POA: Diagnosis not present

## 2016-07-19 DIAGNOSIS — Z299 Encounter for prophylactic measures, unspecified: Secondary | ICD-10-CM | POA: Diagnosis not present

## 2016-07-19 DIAGNOSIS — M545 Low back pain: Secondary | ICD-10-CM | POA: Diagnosis not present

## 2016-07-19 DIAGNOSIS — Z713 Dietary counseling and surveillance: Secondary | ICD-10-CM | POA: Diagnosis not present

## 2016-07-19 DIAGNOSIS — R3911 Hesitancy of micturition: Secondary | ICD-10-CM | POA: Diagnosis not present

## 2016-07-23 DIAGNOSIS — H31019 Macula scars of posterior pole (postinflammatory) (post-traumatic), unspecified eye: Secondary | ICD-10-CM | POA: Diagnosis not present

## 2016-07-23 DIAGNOSIS — H353134 Nonexudative age-related macular degeneration, bilateral, advanced atrophic with subfoveal involvement: Secondary | ICD-10-CM | POA: Diagnosis not present

## 2016-07-23 DIAGNOSIS — H353222 Exudative age-related macular degeneration, left eye, with inactive choroidal neovascularization: Secondary | ICD-10-CM | POA: Diagnosis not present

## 2016-07-23 DIAGNOSIS — H35363 Drusen (degenerative) of macula, bilateral: Secondary | ICD-10-CM | POA: Diagnosis not present

## 2016-07-24 DIAGNOSIS — E7800 Pure hypercholesterolemia, unspecified: Secondary | ICD-10-CM | POA: Diagnosis not present

## 2016-07-24 DIAGNOSIS — M069 Rheumatoid arthritis, unspecified: Secondary | ICD-10-CM | POA: Diagnosis not present

## 2016-07-24 DIAGNOSIS — I10 Essential (primary) hypertension: Secondary | ICD-10-CM | POA: Diagnosis not present

## 2016-07-26 DIAGNOSIS — R079 Chest pain, unspecified: Secondary | ICD-10-CM | POA: Diagnosis not present

## 2016-07-26 DIAGNOSIS — M5030 Other cervical disc degeneration, unspecified cervical region: Secondary | ICD-10-CM | POA: Diagnosis not present

## 2016-07-26 DIAGNOSIS — R42 Dizziness and giddiness: Secondary | ICD-10-CM | POA: Diagnosis not present

## 2016-07-26 DIAGNOSIS — I209 Angina pectoris, unspecified: Secondary | ICD-10-CM | POA: Diagnosis not present

## 2016-07-26 DIAGNOSIS — Z7982 Long term (current) use of aspirin: Secondary | ICD-10-CM | POA: Diagnosis not present

## 2016-07-26 DIAGNOSIS — Z79899 Other long term (current) drug therapy: Secondary | ICD-10-CM | POA: Diagnosis not present

## 2016-07-26 DIAGNOSIS — R002 Palpitations: Secondary | ICD-10-CM | POA: Diagnosis not present

## 2016-07-27 DIAGNOSIS — R42 Dizziness and giddiness: Secondary | ICD-10-CM | POA: Diagnosis not present

## 2016-07-27 DIAGNOSIS — M5030 Other cervical disc degeneration, unspecified cervical region: Secondary | ICD-10-CM | POA: Diagnosis not present

## 2016-07-27 DIAGNOSIS — I209 Angina pectoris, unspecified: Secondary | ICD-10-CM | POA: Diagnosis not present

## 2016-07-27 DIAGNOSIS — R079 Chest pain, unspecified: Secondary | ICD-10-CM | POA: Diagnosis not present

## 2016-07-30 DIAGNOSIS — M545 Low back pain: Secondary | ICD-10-CM | POA: Diagnosis not present

## 2016-07-30 DIAGNOSIS — M9983 Other biomechanical lesions of lumbar region: Secondary | ICD-10-CM | POA: Diagnosis not present

## 2016-07-30 DIAGNOSIS — I77811 Abdominal aortic ectasia: Secondary | ICD-10-CM | POA: Diagnosis not present

## 2016-08-01 DIAGNOSIS — R079 Chest pain, unspecified: Secondary | ICD-10-CM | POA: Diagnosis not present

## 2016-08-01 DIAGNOSIS — Z09 Encounter for follow-up examination after completed treatment for conditions other than malignant neoplasm: Secondary | ICD-10-CM | POA: Diagnosis not present

## 2016-08-01 DIAGNOSIS — M545 Low back pain: Secondary | ICD-10-CM | POA: Diagnosis not present

## 2016-08-01 DIAGNOSIS — Z713 Dietary counseling and surveillance: Secondary | ICD-10-CM | POA: Diagnosis not present

## 2016-08-01 DIAGNOSIS — Z299 Encounter for prophylactic measures, unspecified: Secondary | ICD-10-CM | POA: Diagnosis not present

## 2016-08-01 DIAGNOSIS — Z6822 Body mass index (BMI) 22.0-22.9, adult: Secondary | ICD-10-CM | POA: Diagnosis not present

## 2016-08-13 DIAGNOSIS — I10 Essential (primary) hypertension: Secondary | ICD-10-CM | POA: Diagnosis not present

## 2016-08-13 DIAGNOSIS — E7800 Pure hypercholesterolemia, unspecified: Secondary | ICD-10-CM | POA: Diagnosis not present

## 2016-08-13 DIAGNOSIS — M069 Rheumatoid arthritis, unspecified: Secondary | ICD-10-CM | POA: Diagnosis not present

## 2016-08-16 DIAGNOSIS — G8929 Other chronic pain: Secondary | ICD-10-CM | POA: Diagnosis not present

## 2016-08-16 DIAGNOSIS — M545 Low back pain: Secondary | ICD-10-CM | POA: Diagnosis not present

## 2016-08-16 DIAGNOSIS — Z713 Dietary counseling and surveillance: Secondary | ICD-10-CM | POA: Diagnosis not present

## 2016-08-16 DIAGNOSIS — Z299 Encounter for prophylactic measures, unspecified: Secondary | ICD-10-CM | POA: Diagnosis not present

## 2016-08-16 DIAGNOSIS — G629 Polyneuropathy, unspecified: Secondary | ICD-10-CM | POA: Diagnosis not present

## 2016-08-16 DIAGNOSIS — I10 Essential (primary) hypertension: Secondary | ICD-10-CM | POA: Diagnosis not present

## 2016-08-16 DIAGNOSIS — Z6822 Body mass index (BMI) 22.0-22.9, adult: Secondary | ICD-10-CM | POA: Diagnosis not present

## 2016-09-13 DIAGNOSIS — I10 Essential (primary) hypertension: Secondary | ICD-10-CM | POA: Diagnosis not present

## 2016-09-13 DIAGNOSIS — E7800 Pure hypercholesterolemia, unspecified: Secondary | ICD-10-CM | POA: Diagnosis not present

## 2016-09-13 DIAGNOSIS — M069 Rheumatoid arthritis, unspecified: Secondary | ICD-10-CM | POA: Diagnosis not present

## 2016-09-16 DIAGNOSIS — Z6822 Body mass index (BMI) 22.0-22.9, adult: Secondary | ICD-10-CM | POA: Diagnosis not present

## 2016-09-16 DIAGNOSIS — G4700 Insomnia, unspecified: Secondary | ICD-10-CM | POA: Diagnosis not present

## 2016-09-16 DIAGNOSIS — I10 Essential (primary) hypertension: Secondary | ICD-10-CM | POA: Diagnosis not present

## 2016-09-16 DIAGNOSIS — Z299 Encounter for prophylactic measures, unspecified: Secondary | ICD-10-CM | POA: Diagnosis not present

## 2016-09-16 DIAGNOSIS — M545 Low back pain: Secondary | ICD-10-CM | POA: Diagnosis not present

## 2016-10-15 DIAGNOSIS — E7800 Pure hypercholesterolemia, unspecified: Secondary | ICD-10-CM | POA: Diagnosis not present

## 2016-10-15 DIAGNOSIS — I10 Essential (primary) hypertension: Secondary | ICD-10-CM | POA: Diagnosis not present

## 2016-10-15 DIAGNOSIS — M069 Rheumatoid arthritis, unspecified: Secondary | ICD-10-CM | POA: Diagnosis not present

## 2016-10-16 DIAGNOSIS — M810 Age-related osteoporosis without current pathological fracture: Secondary | ICD-10-CM | POA: Diagnosis not present

## 2016-10-16 DIAGNOSIS — Z7189 Other specified counseling: Secondary | ICD-10-CM | POA: Diagnosis not present

## 2016-10-16 DIAGNOSIS — I10 Essential (primary) hypertension: Secondary | ICD-10-CM | POA: Diagnosis not present

## 2016-10-16 DIAGNOSIS — Z299 Encounter for prophylactic measures, unspecified: Secondary | ICD-10-CM | POA: Diagnosis not present

## 2016-10-16 DIAGNOSIS — Z1389 Encounter for screening for other disorder: Secondary | ICD-10-CM | POA: Diagnosis not present

## 2016-10-16 DIAGNOSIS — F17200 Nicotine dependence, unspecified, uncomplicated: Secondary | ICD-10-CM | POA: Diagnosis not present

## 2016-10-16 DIAGNOSIS — Z0000 Encounter for general adult medical examination without abnormal findings: Secondary | ICD-10-CM | POA: Diagnosis not present

## 2016-10-16 DIAGNOSIS — Z79899 Other long term (current) drug therapy: Secondary | ICD-10-CM | POA: Diagnosis not present

## 2016-10-16 DIAGNOSIS — F419 Anxiety disorder, unspecified: Secondary | ICD-10-CM | POA: Diagnosis not present

## 2016-10-16 DIAGNOSIS — R5383 Other fatigue: Secondary | ICD-10-CM | POA: Diagnosis not present

## 2016-10-16 DIAGNOSIS — Z6822 Body mass index (BMI) 22.0-22.9, adult: Secondary | ICD-10-CM | POA: Diagnosis not present

## 2016-10-16 DIAGNOSIS — E7800 Pure hypercholesterolemia, unspecified: Secondary | ICD-10-CM | POA: Diagnosis not present

## 2016-11-14 DIAGNOSIS — F419 Anxiety disorder, unspecified: Secondary | ICD-10-CM | POA: Diagnosis not present

## 2016-11-14 DIAGNOSIS — G8929 Other chronic pain: Secondary | ICD-10-CM | POA: Diagnosis not present

## 2016-11-14 DIAGNOSIS — Z6822 Body mass index (BMI) 22.0-22.9, adult: Secondary | ICD-10-CM | POA: Diagnosis not present

## 2016-11-14 DIAGNOSIS — I10 Essential (primary) hypertension: Secondary | ICD-10-CM | POA: Diagnosis not present

## 2016-11-14 DIAGNOSIS — Z299 Encounter for prophylactic measures, unspecified: Secondary | ICD-10-CM | POA: Diagnosis not present

## 2016-11-14 DIAGNOSIS — M810 Age-related osteoporosis without current pathological fracture: Secondary | ICD-10-CM | POA: Diagnosis not present

## 2016-11-14 DIAGNOSIS — E7800 Pure hypercholesterolemia, unspecified: Secondary | ICD-10-CM | POA: Diagnosis not present

## 2016-11-14 DIAGNOSIS — Z713 Dietary counseling and surveillance: Secondary | ICD-10-CM | POA: Diagnosis not present

## 2016-11-14 DIAGNOSIS — M545 Low back pain: Secondary | ICD-10-CM | POA: Diagnosis not present

## 2016-11-18 DIAGNOSIS — M069 Rheumatoid arthritis, unspecified: Secondary | ICD-10-CM | POA: Diagnosis not present

## 2016-11-18 DIAGNOSIS — E7800 Pure hypercholesterolemia, unspecified: Secondary | ICD-10-CM | POA: Diagnosis not present

## 2016-11-18 DIAGNOSIS — I10 Essential (primary) hypertension: Secondary | ICD-10-CM | POA: Diagnosis not present

## 2016-12-02 DIAGNOSIS — I350 Nonrheumatic aortic (valve) stenosis: Secondary | ICD-10-CM | POA: Diagnosis not present

## 2016-12-02 DIAGNOSIS — I340 Nonrheumatic mitral (valve) insufficiency: Secondary | ICD-10-CM | POA: Diagnosis not present

## 2016-12-12 DIAGNOSIS — I10 Essential (primary) hypertension: Secondary | ICD-10-CM | POA: Diagnosis not present

## 2016-12-12 DIAGNOSIS — M545 Low back pain: Secondary | ICD-10-CM | POA: Diagnosis not present

## 2016-12-12 DIAGNOSIS — Z299 Encounter for prophylactic measures, unspecified: Secondary | ICD-10-CM | POA: Diagnosis not present

## 2016-12-12 DIAGNOSIS — E7800 Pure hypercholesterolemia, unspecified: Secondary | ICD-10-CM | POA: Diagnosis not present

## 2016-12-12 DIAGNOSIS — Z6822 Body mass index (BMI) 22.0-22.9, adult: Secondary | ICD-10-CM | POA: Diagnosis not present

## 2016-12-12 DIAGNOSIS — G8929 Other chronic pain: Secondary | ICD-10-CM | POA: Diagnosis not present

## 2016-12-12 DIAGNOSIS — G629 Polyneuropathy, unspecified: Secondary | ICD-10-CM | POA: Diagnosis not present

## 2016-12-12 DIAGNOSIS — M810 Age-related osteoporosis without current pathological fracture: Secondary | ICD-10-CM | POA: Diagnosis not present

## 2016-12-12 DIAGNOSIS — G4700 Insomnia, unspecified: Secondary | ICD-10-CM | POA: Diagnosis not present

## 2017-01-13 DIAGNOSIS — F17210 Nicotine dependence, cigarettes, uncomplicated: Secondary | ICD-10-CM | POA: Diagnosis not present

## 2017-01-13 DIAGNOSIS — E7800 Pure hypercholesterolemia, unspecified: Secondary | ICD-10-CM | POA: Diagnosis not present

## 2017-01-13 DIAGNOSIS — Z299 Encounter for prophylactic measures, unspecified: Secondary | ICD-10-CM | POA: Diagnosis not present

## 2017-01-13 DIAGNOSIS — M1990 Unspecified osteoarthritis, unspecified site: Secondary | ICD-10-CM | POA: Diagnosis not present

## 2017-01-13 DIAGNOSIS — Z6823 Body mass index (BMI) 23.0-23.9, adult: Secondary | ICD-10-CM | POA: Diagnosis not present

## 2017-01-13 DIAGNOSIS — F419 Anxiety disorder, unspecified: Secondary | ICD-10-CM | POA: Diagnosis not present

## 2017-01-13 DIAGNOSIS — Z713 Dietary counseling and surveillance: Secondary | ICD-10-CM | POA: Diagnosis not present

## 2017-01-13 DIAGNOSIS — G629 Polyneuropathy, unspecified: Secondary | ICD-10-CM | POA: Diagnosis not present

## 2017-01-13 DIAGNOSIS — M810 Age-related osteoporosis without current pathological fracture: Secondary | ICD-10-CM | POA: Diagnosis not present

## 2017-01-13 DIAGNOSIS — I10 Essential (primary) hypertension: Secondary | ICD-10-CM | POA: Diagnosis not present

## 2017-01-16 DIAGNOSIS — I10 Essential (primary) hypertension: Secondary | ICD-10-CM | POA: Diagnosis not present

## 2017-01-16 DIAGNOSIS — E7800 Pure hypercholesterolemia, unspecified: Secondary | ICD-10-CM | POA: Diagnosis not present

## 2017-01-16 DIAGNOSIS — M069 Rheumatoid arthritis, unspecified: Secondary | ICD-10-CM | POA: Diagnosis not present

## 2017-02-13 DIAGNOSIS — M1990 Unspecified osteoarthritis, unspecified site: Secondary | ICD-10-CM | POA: Diagnosis not present

## 2017-02-13 DIAGNOSIS — Z6822 Body mass index (BMI) 22.0-22.9, adult: Secondary | ICD-10-CM | POA: Diagnosis not present

## 2017-02-13 DIAGNOSIS — F419 Anxiety disorder, unspecified: Secondary | ICD-10-CM | POA: Diagnosis not present

## 2017-02-13 DIAGNOSIS — F17210 Nicotine dependence, cigarettes, uncomplicated: Secondary | ICD-10-CM | POA: Diagnosis not present

## 2017-02-13 DIAGNOSIS — E7800 Pure hypercholesterolemia, unspecified: Secondary | ICD-10-CM | POA: Diagnosis not present

## 2017-02-13 DIAGNOSIS — G629 Polyneuropathy, unspecified: Secondary | ICD-10-CM | POA: Diagnosis not present

## 2017-02-13 DIAGNOSIS — Z713 Dietary counseling and surveillance: Secondary | ICD-10-CM | POA: Diagnosis not present

## 2017-02-13 DIAGNOSIS — M810 Age-related osteoporosis without current pathological fracture: Secondary | ICD-10-CM | POA: Diagnosis not present

## 2017-02-13 DIAGNOSIS — Z299 Encounter for prophylactic measures, unspecified: Secondary | ICD-10-CM | POA: Diagnosis not present

## 2017-02-13 DIAGNOSIS — I10 Essential (primary) hypertension: Secondary | ICD-10-CM | POA: Diagnosis not present

## 2017-03-04 DIAGNOSIS — M069 Rheumatoid arthritis, unspecified: Secondary | ICD-10-CM | POA: Diagnosis not present

## 2017-03-04 DIAGNOSIS — E7800 Pure hypercholesterolemia, unspecified: Secondary | ICD-10-CM | POA: Diagnosis not present

## 2017-03-04 DIAGNOSIS — I10 Essential (primary) hypertension: Secondary | ICD-10-CM | POA: Diagnosis not present

## 2017-03-13 DIAGNOSIS — G4700 Insomnia, unspecified: Secondary | ICD-10-CM | POA: Diagnosis not present

## 2017-03-13 DIAGNOSIS — M1990 Unspecified osteoarthritis, unspecified site: Secondary | ICD-10-CM | POA: Diagnosis not present

## 2017-03-13 DIAGNOSIS — E7800 Pure hypercholesterolemia, unspecified: Secondary | ICD-10-CM | POA: Diagnosis not present

## 2017-03-13 DIAGNOSIS — I10 Essential (primary) hypertension: Secondary | ICD-10-CM | POA: Diagnosis not present

## 2017-03-13 DIAGNOSIS — Z299 Encounter for prophylactic measures, unspecified: Secondary | ICD-10-CM | POA: Diagnosis not present

## 2017-03-13 DIAGNOSIS — Z6823 Body mass index (BMI) 23.0-23.9, adult: Secondary | ICD-10-CM | POA: Diagnosis not present

## 2017-03-13 DIAGNOSIS — G629 Polyneuropathy, unspecified: Secondary | ICD-10-CM | POA: Diagnosis not present

## 2017-04-07 DIAGNOSIS — I10 Essential (primary) hypertension: Secondary | ICD-10-CM | POA: Diagnosis not present

## 2017-04-07 DIAGNOSIS — E7800 Pure hypercholesterolemia, unspecified: Secondary | ICD-10-CM | POA: Diagnosis not present

## 2017-04-07 DIAGNOSIS — M069 Rheumatoid arthritis, unspecified: Secondary | ICD-10-CM | POA: Diagnosis not present

## 2017-04-11 DIAGNOSIS — Z6823 Body mass index (BMI) 23.0-23.9, adult: Secondary | ICD-10-CM | POA: Diagnosis not present

## 2017-04-11 DIAGNOSIS — G8929 Other chronic pain: Secondary | ICD-10-CM | POA: Diagnosis not present

## 2017-04-11 DIAGNOSIS — I10 Essential (primary) hypertension: Secondary | ICD-10-CM | POA: Diagnosis not present

## 2017-04-11 DIAGNOSIS — Z299 Encounter for prophylactic measures, unspecified: Secondary | ICD-10-CM | POA: Diagnosis not present

## 2017-04-11 DIAGNOSIS — E7800 Pure hypercholesterolemia, unspecified: Secondary | ICD-10-CM | POA: Diagnosis not present

## 2017-04-11 DIAGNOSIS — M542 Cervicalgia: Secondary | ICD-10-CM | POA: Diagnosis not present

## 2017-04-11 DIAGNOSIS — M545 Low back pain: Secondary | ICD-10-CM | POA: Diagnosis not present

## 2017-05-08 DIAGNOSIS — I10 Essential (primary) hypertension: Secondary | ICD-10-CM | POA: Diagnosis not present

## 2017-05-08 DIAGNOSIS — M545 Low back pain: Secondary | ICD-10-CM | POA: Diagnosis not present

## 2017-05-08 DIAGNOSIS — Z6823 Body mass index (BMI) 23.0-23.9, adult: Secondary | ICD-10-CM | POA: Diagnosis not present

## 2017-05-08 DIAGNOSIS — E7800 Pure hypercholesterolemia, unspecified: Secondary | ICD-10-CM | POA: Diagnosis not present

## 2017-05-08 DIAGNOSIS — Z299 Encounter for prophylactic measures, unspecified: Secondary | ICD-10-CM | POA: Diagnosis not present

## 2017-05-08 DIAGNOSIS — M810 Age-related osteoporosis without current pathological fracture: Secondary | ICD-10-CM | POA: Diagnosis not present

## 2017-05-08 DIAGNOSIS — G4700 Insomnia, unspecified: Secondary | ICD-10-CM | POA: Diagnosis not present

## 2017-05-08 DIAGNOSIS — G8929 Other chronic pain: Secondary | ICD-10-CM | POA: Diagnosis not present

## 2017-05-20 DIAGNOSIS — M069 Rheumatoid arthritis, unspecified: Secondary | ICD-10-CM | POA: Diagnosis not present

## 2017-05-20 DIAGNOSIS — I10 Essential (primary) hypertension: Secondary | ICD-10-CM | POA: Diagnosis not present

## 2017-05-20 DIAGNOSIS — E7800 Pure hypercholesterolemia, unspecified: Secondary | ICD-10-CM | POA: Diagnosis not present

## 2017-06-04 DIAGNOSIS — M069 Rheumatoid arthritis, unspecified: Secondary | ICD-10-CM | POA: Diagnosis not present

## 2017-06-04 DIAGNOSIS — E7800 Pure hypercholesterolemia, unspecified: Secondary | ICD-10-CM | POA: Diagnosis not present

## 2017-06-04 DIAGNOSIS — I10 Essential (primary) hypertension: Secondary | ICD-10-CM | POA: Diagnosis not present

## 2017-07-09 DIAGNOSIS — G629 Polyneuropathy, unspecified: Secondary | ICD-10-CM | POA: Diagnosis not present

## 2017-07-09 DIAGNOSIS — I10 Essential (primary) hypertension: Secondary | ICD-10-CM | POA: Diagnosis not present

## 2017-07-09 DIAGNOSIS — F17210 Nicotine dependence, cigarettes, uncomplicated: Secondary | ICD-10-CM | POA: Diagnosis not present

## 2017-07-09 DIAGNOSIS — Z299 Encounter for prophylactic measures, unspecified: Secondary | ICD-10-CM | POA: Diagnosis not present

## 2017-07-09 DIAGNOSIS — Z6822 Body mass index (BMI) 22.0-22.9, adult: Secondary | ICD-10-CM | POA: Diagnosis not present

## 2017-07-09 DIAGNOSIS — M545 Low back pain: Secondary | ICD-10-CM | POA: Diagnosis not present

## 2017-07-09 DIAGNOSIS — E7800 Pure hypercholesterolemia, unspecified: Secondary | ICD-10-CM | POA: Diagnosis not present

## 2017-07-09 DIAGNOSIS — G8929 Other chronic pain: Secondary | ICD-10-CM | POA: Diagnosis not present

## 2017-07-15 DIAGNOSIS — I10 Essential (primary) hypertension: Secondary | ICD-10-CM | POA: Diagnosis not present

## 2017-07-15 DIAGNOSIS — E7800 Pure hypercholesterolemia, unspecified: Secondary | ICD-10-CM | POA: Diagnosis not present

## 2017-07-15 DIAGNOSIS — M069 Rheumatoid arthritis, unspecified: Secondary | ICD-10-CM | POA: Diagnosis not present

## 2017-07-22 DIAGNOSIS — H35363 Drusen (degenerative) of macula, bilateral: Secondary | ICD-10-CM | POA: Diagnosis not present

## 2017-07-22 DIAGNOSIS — H31019 Macula scars of posterior pole (postinflammatory) (post-traumatic), unspecified eye: Secondary | ICD-10-CM | POA: Diagnosis not present

## 2017-07-22 DIAGNOSIS — H353134 Nonexudative age-related macular degeneration, bilateral, advanced atrophic with subfoveal involvement: Secondary | ICD-10-CM | POA: Diagnosis not present

## 2017-07-22 DIAGNOSIS — H353222 Exudative age-related macular degeneration, left eye, with inactive choroidal neovascularization: Secondary | ICD-10-CM | POA: Diagnosis not present

## 2017-08-11 DIAGNOSIS — M069 Rheumatoid arthritis, unspecified: Secondary | ICD-10-CM | POA: Diagnosis not present

## 2017-08-11 DIAGNOSIS — I10 Essential (primary) hypertension: Secondary | ICD-10-CM | POA: Diagnosis not present

## 2017-08-11 DIAGNOSIS — E7800 Pure hypercholesterolemia, unspecified: Secondary | ICD-10-CM | POA: Diagnosis not present

## 2017-09-10 DIAGNOSIS — I10 Essential (primary) hypertension: Secondary | ICD-10-CM | POA: Diagnosis not present

## 2017-09-10 DIAGNOSIS — E7800 Pure hypercholesterolemia, unspecified: Secondary | ICD-10-CM | POA: Diagnosis not present

## 2017-09-10 DIAGNOSIS — M069 Rheumatoid arthritis, unspecified: Secondary | ICD-10-CM | POA: Diagnosis not present

## 2017-10-08 DIAGNOSIS — I10 Essential (primary) hypertension: Secondary | ICD-10-CM | POA: Diagnosis not present

## 2017-10-08 DIAGNOSIS — M545 Low back pain: Secondary | ICD-10-CM | POA: Diagnosis not present

## 2017-10-08 DIAGNOSIS — Z6821 Body mass index (BMI) 21.0-21.9, adult: Secondary | ICD-10-CM | POA: Diagnosis not present

## 2017-10-08 DIAGNOSIS — G8929 Other chronic pain: Secondary | ICD-10-CM | POA: Diagnosis not present

## 2017-10-08 DIAGNOSIS — Z299 Encounter for prophylactic measures, unspecified: Secondary | ICD-10-CM | POA: Diagnosis not present

## 2017-10-08 DIAGNOSIS — G4700 Insomnia, unspecified: Secondary | ICD-10-CM | POA: Diagnosis not present

## 2017-10-10 DIAGNOSIS — I10 Essential (primary) hypertension: Secondary | ICD-10-CM | POA: Diagnosis not present

## 2017-10-10 DIAGNOSIS — E7800 Pure hypercholesterolemia, unspecified: Secondary | ICD-10-CM | POA: Diagnosis not present

## 2017-10-10 DIAGNOSIS — M069 Rheumatoid arthritis, unspecified: Secondary | ICD-10-CM | POA: Diagnosis not present

## 2017-10-22 DIAGNOSIS — Z1211 Encounter for screening for malignant neoplasm of colon: Secondary | ICD-10-CM | POA: Diagnosis not present

## 2017-10-22 DIAGNOSIS — Z1339 Encounter for screening examination for other mental health and behavioral disorders: Secondary | ICD-10-CM | POA: Diagnosis not present

## 2017-10-22 DIAGNOSIS — I10 Essential (primary) hypertension: Secondary | ICD-10-CM | POA: Diagnosis not present

## 2017-10-22 DIAGNOSIS — Z0000 Encounter for general adult medical examination without abnormal findings: Secondary | ICD-10-CM | POA: Diagnosis not present

## 2017-10-22 DIAGNOSIS — E7800 Pure hypercholesterolemia, unspecified: Secondary | ICD-10-CM | POA: Diagnosis not present

## 2017-10-22 DIAGNOSIS — M545 Low back pain: Secondary | ICD-10-CM | POA: Diagnosis not present

## 2017-10-22 DIAGNOSIS — Z1331 Encounter for screening for depression: Secondary | ICD-10-CM | POA: Diagnosis not present

## 2017-10-22 DIAGNOSIS — Z6820 Body mass index (BMI) 20.0-20.9, adult: Secondary | ICD-10-CM | POA: Diagnosis not present

## 2017-10-22 DIAGNOSIS — Z7189 Other specified counseling: Secondary | ICD-10-CM | POA: Diagnosis not present

## 2017-10-22 DIAGNOSIS — R5383 Other fatigue: Secondary | ICD-10-CM | POA: Diagnosis not present

## 2017-10-22 DIAGNOSIS — Z299 Encounter for prophylactic measures, unspecified: Secondary | ICD-10-CM | POA: Diagnosis not present

## 2017-10-22 DIAGNOSIS — Z79899 Other long term (current) drug therapy: Secondary | ICD-10-CM | POA: Diagnosis not present

## 2017-11-03 DIAGNOSIS — Z79899 Other long term (current) drug therapy: Secondary | ICD-10-CM | POA: Diagnosis not present

## 2017-11-03 DIAGNOSIS — H548 Legal blindness, as defined in USA: Secondary | ICD-10-CM | POA: Diagnosis not present

## 2017-11-03 DIAGNOSIS — S42201A Unspecified fracture of upper end of right humerus, initial encounter for closed fracture: Secondary | ICD-10-CM | POA: Diagnosis not present

## 2017-11-03 DIAGNOSIS — Z7982 Long term (current) use of aspirin: Secondary | ICD-10-CM | POA: Diagnosis not present

## 2017-11-03 DIAGNOSIS — Z87891 Personal history of nicotine dependence: Secondary | ICD-10-CM | POA: Diagnosis not present

## 2017-11-03 DIAGNOSIS — W1839XA Other fall on same level, initial encounter: Secondary | ICD-10-CM | POA: Diagnosis not present

## 2017-11-03 DIAGNOSIS — M810 Age-related osteoporosis without current pathological fracture: Secondary | ICD-10-CM | POA: Diagnosis not present

## 2017-11-03 DIAGNOSIS — I10 Essential (primary) hypertension: Secondary | ICD-10-CM | POA: Diagnosis not present

## 2017-11-03 DIAGNOSIS — S42211A Unspecified displaced fracture of surgical neck of right humerus, initial encounter for closed fracture: Secondary | ICD-10-CM | POA: Diagnosis not present

## 2017-11-03 DIAGNOSIS — E7800 Pure hypercholesterolemia, unspecified: Secondary | ICD-10-CM | POA: Diagnosis not present

## 2017-11-03 DIAGNOSIS — M1990 Unspecified osteoarthritis, unspecified site: Secondary | ICD-10-CM | POA: Diagnosis not present

## 2017-11-10 DIAGNOSIS — M25532 Pain in left wrist: Secondary | ICD-10-CM | POA: Diagnosis not present

## 2017-11-10 DIAGNOSIS — S42211A Unspecified displaced fracture of surgical neck of right humerus, initial encounter for closed fracture: Secondary | ICD-10-CM | POA: Diagnosis not present

## 2017-11-12 DIAGNOSIS — E7800 Pure hypercholesterolemia, unspecified: Secondary | ICD-10-CM | POA: Diagnosis not present

## 2017-11-12 DIAGNOSIS — I10 Essential (primary) hypertension: Secondary | ICD-10-CM | POA: Diagnosis not present

## 2017-11-12 DIAGNOSIS — M069 Rheumatoid arthritis, unspecified: Secondary | ICD-10-CM | POA: Diagnosis not present

## 2017-11-13 DIAGNOSIS — E785 Hyperlipidemia, unspecified: Secondary | ICD-10-CM | POA: Diagnosis not present

## 2017-11-13 DIAGNOSIS — Z7982 Long term (current) use of aspirin: Secondary | ICD-10-CM | POA: Diagnosis not present

## 2017-11-13 DIAGNOSIS — H548 Legal blindness, as defined in USA: Secondary | ICD-10-CM | POA: Diagnosis not present

## 2017-11-13 DIAGNOSIS — S42291A Other displaced fracture of upper end of right humerus, initial encounter for closed fracture: Secondary | ICD-10-CM | POA: Diagnosis not present

## 2017-11-13 DIAGNOSIS — M1990 Unspecified osteoarthritis, unspecified site: Secondary | ICD-10-CM | POA: Diagnosis not present

## 2017-11-13 DIAGNOSIS — M810 Age-related osteoporosis without current pathological fracture: Secondary | ICD-10-CM | POA: Diagnosis not present

## 2017-11-13 DIAGNOSIS — Z981 Arthrodesis status: Secondary | ICD-10-CM | POA: Diagnosis not present

## 2017-11-13 DIAGNOSIS — Z90710 Acquired absence of both cervix and uterus: Secondary | ICD-10-CM | POA: Diagnosis not present

## 2017-11-13 DIAGNOSIS — I10 Essential (primary) hypertension: Secondary | ICD-10-CM | POA: Diagnosis not present

## 2017-11-14 DIAGNOSIS — Z981 Arthrodesis status: Secondary | ICD-10-CM | POA: Diagnosis not present

## 2017-11-14 DIAGNOSIS — Z90710 Acquired absence of both cervix and uterus: Secondary | ICD-10-CM | POA: Diagnosis not present

## 2017-11-14 DIAGNOSIS — Z7982 Long term (current) use of aspirin: Secondary | ICD-10-CM | POA: Diagnosis not present

## 2017-11-14 DIAGNOSIS — S42211A Unspecified displaced fracture of surgical neck of right humerus, initial encounter for closed fracture: Secondary | ICD-10-CM | POA: Diagnosis not present

## 2017-11-14 DIAGNOSIS — I10 Essential (primary) hypertension: Secondary | ICD-10-CM | POA: Diagnosis not present

## 2017-11-14 DIAGNOSIS — G8918 Other acute postprocedural pain: Secondary | ICD-10-CM | POA: Diagnosis not present

## 2017-11-14 DIAGNOSIS — S42231A 3-part fracture of surgical neck of right humerus, initial encounter for closed fracture: Secondary | ICD-10-CM | POA: Diagnosis not present

## 2017-11-14 DIAGNOSIS — S42201A Unspecified fracture of upper end of right humerus, initial encounter for closed fracture: Secondary | ICD-10-CM | POA: Diagnosis not present

## 2017-11-14 DIAGNOSIS — S42291A Other displaced fracture of upper end of right humerus, initial encounter for closed fracture: Secondary | ICD-10-CM | POA: Diagnosis not present

## 2017-11-14 DIAGNOSIS — E785 Hyperlipidemia, unspecified: Secondary | ICD-10-CM | POA: Diagnosis not present

## 2017-11-24 DIAGNOSIS — S42211D Unspecified displaced fracture of surgical neck of right humerus, subsequent encounter for fracture with routine healing: Secondary | ICD-10-CM | POA: Diagnosis not present

## 2017-12-01 DIAGNOSIS — S42211D Unspecified displaced fracture of surgical neck of right humerus, subsequent encounter for fracture with routine healing: Secondary | ICD-10-CM | POA: Diagnosis not present

## 2017-12-23 DIAGNOSIS — I10 Essential (primary) hypertension: Secondary | ICD-10-CM | POA: Diagnosis not present

## 2017-12-23 DIAGNOSIS — E7800 Pure hypercholesterolemia, unspecified: Secondary | ICD-10-CM | POA: Diagnosis not present

## 2017-12-23 DIAGNOSIS — M069 Rheumatoid arthritis, unspecified: Secondary | ICD-10-CM | POA: Diagnosis not present

## 2017-12-29 DIAGNOSIS — S42211D Unspecified displaced fracture of surgical neck of right humerus, subsequent encounter for fracture with routine healing: Secondary | ICD-10-CM | POA: Diagnosis not present

## 2018-01-07 DIAGNOSIS — Z681 Body mass index (BMI) 19 or less, adult: Secondary | ICD-10-CM | POA: Diagnosis not present

## 2018-01-07 DIAGNOSIS — M545 Low back pain: Secondary | ICD-10-CM | POA: Diagnosis not present

## 2018-01-07 DIAGNOSIS — I10 Essential (primary) hypertension: Secondary | ICD-10-CM | POA: Diagnosis not present

## 2018-01-07 DIAGNOSIS — G8929 Other chronic pain: Secondary | ICD-10-CM | POA: Diagnosis not present

## 2018-01-07 DIAGNOSIS — Z299 Encounter for prophylactic measures, unspecified: Secondary | ICD-10-CM | POA: Diagnosis not present

## 2018-01-07 DIAGNOSIS — Z79899 Other long term (current) drug therapy: Secondary | ICD-10-CM | POA: Diagnosis not present

## 2018-01-20 DIAGNOSIS — E7800 Pure hypercholesterolemia, unspecified: Secondary | ICD-10-CM | POA: Diagnosis not present

## 2018-01-20 DIAGNOSIS — M069 Rheumatoid arthritis, unspecified: Secondary | ICD-10-CM | POA: Diagnosis not present

## 2018-01-20 DIAGNOSIS — I10 Essential (primary) hypertension: Secondary | ICD-10-CM | POA: Diagnosis not present

## 2018-02-13 DIAGNOSIS — Z299 Encounter for prophylactic measures, unspecified: Secondary | ICD-10-CM | POA: Diagnosis not present

## 2018-02-13 DIAGNOSIS — I10 Essential (primary) hypertension: Secondary | ICD-10-CM | POA: Diagnosis not present

## 2018-02-13 DIAGNOSIS — Z681 Body mass index (BMI) 19 or less, adult: Secondary | ICD-10-CM | POA: Diagnosis not present

## 2018-02-13 DIAGNOSIS — F419 Anxiety disorder, unspecified: Secondary | ICD-10-CM | POA: Diagnosis not present

## 2018-02-13 DIAGNOSIS — F17210 Nicotine dependence, cigarettes, uncomplicated: Secondary | ICD-10-CM | POA: Diagnosis not present

## 2018-03-18 DIAGNOSIS — M069 Rheumatoid arthritis, unspecified: Secondary | ICD-10-CM | POA: Diagnosis not present

## 2018-03-18 DIAGNOSIS — I10 Essential (primary) hypertension: Secondary | ICD-10-CM | POA: Diagnosis not present

## 2018-03-18 DIAGNOSIS — E7800 Pure hypercholesterolemia, unspecified: Secondary | ICD-10-CM | POA: Diagnosis not present

## 2018-04-08 DIAGNOSIS — M545 Low back pain: Secondary | ICD-10-CM | POA: Diagnosis not present

## 2018-04-08 DIAGNOSIS — Z299 Encounter for prophylactic measures, unspecified: Secondary | ICD-10-CM | POA: Diagnosis not present

## 2018-04-08 DIAGNOSIS — G8929 Other chronic pain: Secondary | ICD-10-CM | POA: Diagnosis not present

## 2018-04-08 DIAGNOSIS — I10 Essential (primary) hypertension: Secondary | ICD-10-CM | POA: Diagnosis not present

## 2018-04-08 DIAGNOSIS — Z6820 Body mass index (BMI) 20.0-20.9, adult: Secondary | ICD-10-CM | POA: Diagnosis not present

## 2018-04-13 DIAGNOSIS — M069 Rheumatoid arthritis, unspecified: Secondary | ICD-10-CM | POA: Diagnosis not present

## 2018-04-13 DIAGNOSIS — I10 Essential (primary) hypertension: Secondary | ICD-10-CM | POA: Diagnosis not present

## 2018-04-13 DIAGNOSIS — E7800 Pure hypercholesterolemia, unspecified: Secondary | ICD-10-CM | POA: Diagnosis not present

## 2018-05-20 DIAGNOSIS — E7800 Pure hypercholesterolemia, unspecified: Secondary | ICD-10-CM | POA: Diagnosis not present

## 2018-05-20 DIAGNOSIS — I10 Essential (primary) hypertension: Secondary | ICD-10-CM | POA: Diagnosis not present

## 2018-05-20 DIAGNOSIS — M069 Rheumatoid arthritis, unspecified: Secondary | ICD-10-CM | POA: Diagnosis not present

## 2018-06-17 DIAGNOSIS — E7800 Pure hypercholesterolemia, unspecified: Secondary | ICD-10-CM | POA: Diagnosis not present

## 2018-06-17 DIAGNOSIS — M069 Rheumatoid arthritis, unspecified: Secondary | ICD-10-CM | POA: Diagnosis not present

## 2018-06-17 DIAGNOSIS — I10 Essential (primary) hypertension: Secondary | ICD-10-CM | POA: Diagnosis not present

## 2018-07-09 DIAGNOSIS — E7800 Pure hypercholesterolemia, unspecified: Secondary | ICD-10-CM | POA: Diagnosis not present

## 2018-07-09 DIAGNOSIS — F419 Anxiety disorder, unspecified: Secondary | ICD-10-CM | POA: Diagnosis not present

## 2018-07-09 DIAGNOSIS — F17210 Nicotine dependence, cigarettes, uncomplicated: Secondary | ICD-10-CM | POA: Diagnosis not present

## 2018-07-09 DIAGNOSIS — M545 Low back pain: Secondary | ICD-10-CM | POA: Diagnosis not present

## 2018-07-09 DIAGNOSIS — I10 Essential (primary) hypertension: Secondary | ICD-10-CM | POA: Diagnosis not present

## 2018-07-09 DIAGNOSIS — Z6820 Body mass index (BMI) 20.0-20.9, adult: Secondary | ICD-10-CM | POA: Diagnosis not present

## 2018-07-09 DIAGNOSIS — Z299 Encounter for prophylactic measures, unspecified: Secondary | ICD-10-CM | POA: Diagnosis not present

## 2018-07-13 DIAGNOSIS — I10 Essential (primary) hypertension: Secondary | ICD-10-CM | POA: Diagnosis not present

## 2018-07-13 DIAGNOSIS — M069 Rheumatoid arthritis, unspecified: Secondary | ICD-10-CM | POA: Diagnosis not present

## 2018-07-13 DIAGNOSIS — E7800 Pure hypercholesterolemia, unspecified: Secondary | ICD-10-CM | POA: Diagnosis not present

## 2018-07-28 DIAGNOSIS — H353212 Exudative age-related macular degeneration, right eye, with inactive choroidal neovascularization: Secondary | ICD-10-CM | POA: Diagnosis not present

## 2018-07-28 DIAGNOSIS — H353134 Nonexudative age-related macular degeneration, bilateral, advanced atrophic with subfoveal involvement: Secondary | ICD-10-CM | POA: Diagnosis not present

## 2018-07-28 DIAGNOSIS — H31019 Macula scars of posterior pole (postinflammatory) (post-traumatic), unspecified eye: Secondary | ICD-10-CM | POA: Diagnosis not present

## 2018-07-28 DIAGNOSIS — H35363 Drusen (degenerative) of macula, bilateral: Secondary | ICD-10-CM | POA: Diagnosis not present

## 2018-07-28 DIAGNOSIS — H353222 Exudative age-related macular degeneration, left eye, with inactive choroidal neovascularization: Secondary | ICD-10-CM | POA: Diagnosis not present

## 2018-09-09 DIAGNOSIS — I10 Essential (primary) hypertension: Secondary | ICD-10-CM | POA: Diagnosis not present

## 2018-09-09 DIAGNOSIS — M069 Rheumatoid arthritis, unspecified: Secondary | ICD-10-CM | POA: Diagnosis not present

## 2018-09-09 DIAGNOSIS — E7800 Pure hypercholesterolemia, unspecified: Secondary | ICD-10-CM | POA: Diagnosis not present

## 2018-10-07 DIAGNOSIS — Z299 Encounter for prophylactic measures, unspecified: Secondary | ICD-10-CM | POA: Diagnosis not present

## 2018-10-07 DIAGNOSIS — F17210 Nicotine dependence, cigarettes, uncomplicated: Secondary | ICD-10-CM | POA: Diagnosis not present

## 2018-10-07 DIAGNOSIS — E7800 Pure hypercholesterolemia, unspecified: Secondary | ICD-10-CM | POA: Diagnosis not present

## 2018-10-07 DIAGNOSIS — I10 Essential (primary) hypertension: Secondary | ICD-10-CM | POA: Diagnosis not present

## 2018-10-07 DIAGNOSIS — G8929 Other chronic pain: Secondary | ICD-10-CM | POA: Diagnosis not present

## 2018-10-07 DIAGNOSIS — M545 Low back pain: Secondary | ICD-10-CM | POA: Diagnosis not present

## 2018-10-07 DIAGNOSIS — Z6821 Body mass index (BMI) 21.0-21.9, adult: Secondary | ICD-10-CM | POA: Diagnosis not present

## 2018-10-14 DIAGNOSIS — E7800 Pure hypercholesterolemia, unspecified: Secondary | ICD-10-CM | POA: Diagnosis not present

## 2018-10-14 DIAGNOSIS — I10 Essential (primary) hypertension: Secondary | ICD-10-CM | POA: Diagnosis not present

## 2018-10-14 DIAGNOSIS — M069 Rheumatoid arthritis, unspecified: Secondary | ICD-10-CM | POA: Diagnosis not present

## 2018-10-29 DIAGNOSIS — Z6820 Body mass index (BMI) 20.0-20.9, adult: Secondary | ICD-10-CM | POA: Diagnosis not present

## 2018-10-29 DIAGNOSIS — Z1211 Encounter for screening for malignant neoplasm of colon: Secondary | ICD-10-CM | POA: Diagnosis not present

## 2018-10-29 DIAGNOSIS — Z1331 Encounter for screening for depression: Secondary | ICD-10-CM | POA: Diagnosis not present

## 2018-10-29 DIAGNOSIS — Z7189 Other specified counseling: Secondary | ICD-10-CM | POA: Diagnosis not present

## 2018-10-29 DIAGNOSIS — Z299 Encounter for prophylactic measures, unspecified: Secondary | ICD-10-CM | POA: Diagnosis not present

## 2018-10-29 DIAGNOSIS — I10 Essential (primary) hypertension: Secondary | ICD-10-CM | POA: Diagnosis not present

## 2018-10-29 DIAGNOSIS — Z79899 Other long term (current) drug therapy: Secondary | ICD-10-CM | POA: Diagnosis not present

## 2018-10-29 DIAGNOSIS — R5383 Other fatigue: Secondary | ICD-10-CM | POA: Diagnosis not present

## 2018-10-29 DIAGNOSIS — E7800 Pure hypercholesterolemia, unspecified: Secondary | ICD-10-CM | POA: Diagnosis not present

## 2018-10-29 DIAGNOSIS — Z0000 Encounter for general adult medical examination without abnormal findings: Secondary | ICD-10-CM | POA: Diagnosis not present

## 2018-10-29 DIAGNOSIS — Z1339 Encounter for screening examination for other mental health and behavioral disorders: Secondary | ICD-10-CM | POA: Diagnosis not present

## 2018-11-06 DIAGNOSIS — I10 Essential (primary) hypertension: Secondary | ICD-10-CM | POA: Diagnosis not present

## 2018-11-06 DIAGNOSIS — M069 Rheumatoid arthritis, unspecified: Secondary | ICD-10-CM | POA: Diagnosis not present

## 2018-11-06 DIAGNOSIS — E7800 Pure hypercholesterolemia, unspecified: Secondary | ICD-10-CM | POA: Diagnosis not present

## 2018-11-09 DIAGNOSIS — F419 Anxiety disorder, unspecified: Secondary | ICD-10-CM | POA: Diagnosis not present

## 2018-11-09 DIAGNOSIS — M545 Low back pain: Secondary | ICD-10-CM | POA: Diagnosis not present

## 2018-11-09 DIAGNOSIS — Z299 Encounter for prophylactic measures, unspecified: Secondary | ICD-10-CM | POA: Diagnosis not present

## 2018-11-09 DIAGNOSIS — Z6821 Body mass index (BMI) 21.0-21.9, adult: Secondary | ICD-10-CM | POA: Diagnosis not present

## 2018-11-09 DIAGNOSIS — I10 Essential (primary) hypertension: Secondary | ICD-10-CM | POA: Diagnosis not present

## 2018-12-17 DIAGNOSIS — I10 Essential (primary) hypertension: Secondary | ICD-10-CM | POA: Diagnosis not present

## 2018-12-17 DIAGNOSIS — M069 Rheumatoid arthritis, unspecified: Secondary | ICD-10-CM | POA: Diagnosis not present

## 2018-12-17 DIAGNOSIS — E7800 Pure hypercholesterolemia, unspecified: Secondary | ICD-10-CM | POA: Diagnosis not present

## 2019-01-07 DIAGNOSIS — Z299 Encounter for prophylactic measures, unspecified: Secondary | ICD-10-CM | POA: Diagnosis not present

## 2019-01-07 DIAGNOSIS — F419 Anxiety disorder, unspecified: Secondary | ICD-10-CM | POA: Diagnosis not present

## 2019-01-07 DIAGNOSIS — E7800 Pure hypercholesterolemia, unspecified: Secondary | ICD-10-CM | POA: Diagnosis not present

## 2019-01-07 DIAGNOSIS — M1990 Unspecified osteoarthritis, unspecified site: Secondary | ICD-10-CM | POA: Diagnosis not present

## 2019-01-07 DIAGNOSIS — M810 Age-related osteoporosis without current pathological fracture: Secondary | ICD-10-CM | POA: Diagnosis not present

## 2019-01-07 DIAGNOSIS — Z6820 Body mass index (BMI) 20.0-20.9, adult: Secondary | ICD-10-CM | POA: Diagnosis not present

## 2019-01-07 DIAGNOSIS — I10 Essential (primary) hypertension: Secondary | ICD-10-CM | POA: Diagnosis not present

## 2019-01-18 DIAGNOSIS — M069 Rheumatoid arthritis, unspecified: Secondary | ICD-10-CM | POA: Diagnosis not present

## 2019-01-18 DIAGNOSIS — E7800 Pure hypercholesterolemia, unspecified: Secondary | ICD-10-CM | POA: Diagnosis not present

## 2019-01-18 DIAGNOSIS — I10 Essential (primary) hypertension: Secondary | ICD-10-CM | POA: Diagnosis not present

## 2019-02-16 DIAGNOSIS — M069 Rheumatoid arthritis, unspecified: Secondary | ICD-10-CM | POA: Diagnosis not present

## 2019-02-16 DIAGNOSIS — I10 Essential (primary) hypertension: Secondary | ICD-10-CM | POA: Diagnosis not present

## 2019-02-16 DIAGNOSIS — E7800 Pure hypercholesterolemia, unspecified: Secondary | ICD-10-CM | POA: Diagnosis not present

## 2019-03-23 DIAGNOSIS — I10 Essential (primary) hypertension: Secondary | ICD-10-CM | POA: Diagnosis not present

## 2019-03-23 DIAGNOSIS — E7800 Pure hypercholesterolemia, unspecified: Secondary | ICD-10-CM | POA: Diagnosis not present

## 2019-03-23 DIAGNOSIS — M069 Rheumatoid arthritis, unspecified: Secondary | ICD-10-CM | POA: Diagnosis not present

## 2019-04-09 DIAGNOSIS — F419 Anxiety disorder, unspecified: Secondary | ICD-10-CM | POA: Diagnosis not present

## 2019-04-09 DIAGNOSIS — I10 Essential (primary) hypertension: Secondary | ICD-10-CM | POA: Diagnosis not present

## 2019-04-09 DIAGNOSIS — M545 Low back pain: Secondary | ICD-10-CM | POA: Diagnosis not present

## 2019-04-09 DIAGNOSIS — Z299 Encounter for prophylactic measures, unspecified: Secondary | ICD-10-CM | POA: Diagnosis not present

## 2019-04-09 DIAGNOSIS — Z6820 Body mass index (BMI) 20.0-20.9, adult: Secondary | ICD-10-CM | POA: Diagnosis not present

## 2019-05-19 DIAGNOSIS — M069 Rheumatoid arthritis, unspecified: Secondary | ICD-10-CM | POA: Diagnosis not present

## 2019-05-19 DIAGNOSIS — E7800 Pure hypercholesterolemia, unspecified: Secondary | ICD-10-CM | POA: Diagnosis not present

## 2019-05-19 DIAGNOSIS — I10 Essential (primary) hypertension: Secondary | ICD-10-CM | POA: Diagnosis not present

## 2019-06-16 DIAGNOSIS — M069 Rheumatoid arthritis, unspecified: Secondary | ICD-10-CM | POA: Diagnosis not present

## 2019-06-16 DIAGNOSIS — I10 Essential (primary) hypertension: Secondary | ICD-10-CM | POA: Diagnosis not present

## 2019-06-16 DIAGNOSIS — E7800 Pure hypercholesterolemia, unspecified: Secondary | ICD-10-CM | POA: Diagnosis not present

## 2019-07-09 DIAGNOSIS — R197 Diarrhea, unspecified: Secondary | ICD-10-CM | POA: Diagnosis not present

## 2019-07-09 DIAGNOSIS — Z6820 Body mass index (BMI) 20.0-20.9, adult: Secondary | ICD-10-CM | POA: Diagnosis not present

## 2019-07-09 DIAGNOSIS — M1990 Unspecified osteoarthritis, unspecified site: Secondary | ICD-10-CM | POA: Diagnosis not present

## 2019-07-09 DIAGNOSIS — E7800 Pure hypercholesterolemia, unspecified: Secondary | ICD-10-CM | POA: Diagnosis not present

## 2019-07-09 DIAGNOSIS — Z299 Encounter for prophylactic measures, unspecified: Secondary | ICD-10-CM | POA: Diagnosis not present

## 2019-07-09 DIAGNOSIS — I351 Nonrheumatic aortic (valve) insufficiency: Secondary | ICD-10-CM | POA: Diagnosis not present

## 2019-07-20 DIAGNOSIS — M546 Pain in thoracic spine: Secondary | ICD-10-CM | POA: Diagnosis not present

## 2019-07-20 DIAGNOSIS — Z87891 Personal history of nicotine dependence: Secondary | ICD-10-CM | POA: Diagnosis not present

## 2019-07-20 DIAGNOSIS — S20211A Contusion of right front wall of thorax, initial encounter: Secondary | ICD-10-CM | POA: Diagnosis not present

## 2019-07-20 DIAGNOSIS — Z79899 Other long term (current) drug therapy: Secondary | ICD-10-CM | POA: Diagnosis not present

## 2019-07-20 DIAGNOSIS — S299XXA Unspecified injury of thorax, initial encounter: Secondary | ICD-10-CM | POA: Diagnosis not present

## 2019-07-20 DIAGNOSIS — S20219A Contusion of unspecified front wall of thorax, initial encounter: Secondary | ICD-10-CM | POA: Diagnosis not present

## 2019-07-20 DIAGNOSIS — I10 Essential (primary) hypertension: Secondary | ICD-10-CM | POA: Diagnosis not present

## 2019-07-20 DIAGNOSIS — S20212A Contusion of left front wall of thorax, initial encounter: Secondary | ICD-10-CM | POA: Diagnosis not present

## 2019-07-20 DIAGNOSIS — G629 Polyneuropathy, unspecified: Secondary | ICD-10-CM | POA: Diagnosis not present

## 2019-07-20 DIAGNOSIS — S0003XA Contusion of scalp, initial encounter: Secondary | ICD-10-CM | POA: Diagnosis not present

## 2019-07-20 DIAGNOSIS — G3189 Other specified degenerative diseases of nervous system: Secondary | ICD-10-CM | POA: Diagnosis not present

## 2019-07-20 DIAGNOSIS — M47812 Spondylosis without myelopathy or radiculopathy, cervical region: Secondary | ICD-10-CM | POA: Diagnosis not present

## 2019-07-20 DIAGNOSIS — S0990XA Unspecified injury of head, initial encounter: Secondary | ICD-10-CM | POA: Diagnosis not present

## 2019-07-20 DIAGNOSIS — Z7982 Long term (current) use of aspirin: Secondary | ICD-10-CM | POA: Diagnosis not present

## 2019-07-20 DIAGNOSIS — S22000S Wedge compression fracture of unspecified thoracic vertebra, sequela: Secondary | ICD-10-CM | POA: Diagnosis not present

## 2019-07-20 DIAGNOSIS — I700 Atherosclerosis of aorta: Secondary | ICD-10-CM | POA: Diagnosis not present

## 2019-07-20 DIAGNOSIS — E7800 Pure hypercholesterolemia, unspecified: Secondary | ICD-10-CM | POA: Diagnosis not present

## 2019-08-18 DIAGNOSIS — E7800 Pure hypercholesterolemia, unspecified: Secondary | ICD-10-CM | POA: Diagnosis not present

## 2019-08-18 DIAGNOSIS — M069 Rheumatoid arthritis, unspecified: Secondary | ICD-10-CM | POA: Diagnosis not present

## 2019-08-18 DIAGNOSIS — I10 Essential (primary) hypertension: Secondary | ICD-10-CM | POA: Diagnosis not present

## 2019-09-15 DIAGNOSIS — E7800 Pure hypercholesterolemia, unspecified: Secondary | ICD-10-CM | POA: Diagnosis not present

## 2019-09-15 DIAGNOSIS — I10 Essential (primary) hypertension: Secondary | ICD-10-CM | POA: Diagnosis not present

## 2019-09-15 DIAGNOSIS — M069 Rheumatoid arthritis, unspecified: Secondary | ICD-10-CM | POA: Diagnosis not present

## 2019-10-31 DIAGNOSIS — M069 Rheumatoid arthritis, unspecified: Secondary | ICD-10-CM | POA: Diagnosis not present

## 2019-10-31 DIAGNOSIS — I10 Essential (primary) hypertension: Secondary | ICD-10-CM | POA: Diagnosis not present

## 2019-10-31 DIAGNOSIS — E7800 Pure hypercholesterolemia, unspecified: Secondary | ICD-10-CM | POA: Diagnosis not present

## 2019-12-01 DIAGNOSIS — E7800 Pure hypercholesterolemia, unspecified: Secondary | ICD-10-CM | POA: Diagnosis not present

## 2019-12-01 DIAGNOSIS — I10 Essential (primary) hypertension: Secondary | ICD-10-CM | POA: Diagnosis not present

## 2019-12-01 DIAGNOSIS — M069 Rheumatoid arthritis, unspecified: Secondary | ICD-10-CM | POA: Diagnosis not present

## 2019-12-31 ENCOUNTER — Emergency Department (HOSPITAL_COMMUNITY): Payer: Medicare Other

## 2019-12-31 ENCOUNTER — Encounter (HOSPITAL_COMMUNITY): Payer: Self-pay | Admitting: Emergency Medicine

## 2019-12-31 ENCOUNTER — Inpatient Hospital Stay (HOSPITAL_COMMUNITY)
Admission: EM | Admit: 2019-12-31 | Discharge: 2020-01-06 | DRG: 480 | Disposition: A | Discharge status: Home Health | Payer: Medicare Other | Attending: Internal Medicine | Admitting: Internal Medicine

## 2019-12-31 ENCOUNTER — Other Ambulatory Visit: Payer: Self-pay

## 2019-12-31 DIAGNOSIS — J9601 Acute respiratory failure with hypoxia: Secondary | ICD-10-CM | POA: Diagnosis present

## 2019-12-31 DIAGNOSIS — S42291A Other displaced fracture of upper end of right humerus, initial encounter for closed fracture: Secondary | ICD-10-CM | POA: Diagnosis not present

## 2019-12-31 DIAGNOSIS — R410 Disorientation, unspecified: Secondary | ICD-10-CM | POA: Diagnosis not present

## 2019-12-31 DIAGNOSIS — Z9889 Other specified postprocedural states: Secondary | ICD-10-CM

## 2019-12-31 DIAGNOSIS — I739 Peripheral vascular disease, unspecified: Secondary | ICD-10-CM | POA: Diagnosis not present

## 2019-12-31 DIAGNOSIS — F17210 Nicotine dependence, cigarettes, uncomplicated: Secondary | ICD-10-CM | POA: Diagnosis not present

## 2019-12-31 DIAGNOSIS — S72001A Fracture of unspecified part of neck of right femur, initial encounter for closed fracture: Secondary | ICD-10-CM | POA: Diagnosis not present

## 2019-12-31 DIAGNOSIS — E860 Dehydration: Secondary | ICD-10-CM

## 2019-12-31 DIAGNOSIS — S72011D Unspecified intracapsular fracture of right femur, subsequent encounter for closed fracture with routine healing: Secondary | ICD-10-CM | POA: Diagnosis not present

## 2019-12-31 DIAGNOSIS — J9383 Other pneumothorax: Secondary | ICD-10-CM | POA: Diagnosis not present

## 2019-12-31 DIAGNOSIS — S299XXA Unspecified injury of thorax, initial encounter: Secondary | ICD-10-CM | POA: Diagnosis not present

## 2019-12-31 DIAGNOSIS — J439 Emphysema, unspecified: Secondary | ICD-10-CM | POA: Diagnosis not present

## 2019-12-31 DIAGNOSIS — E785 Hyperlipidemia, unspecified: Secondary | ICD-10-CM

## 2019-12-31 DIAGNOSIS — M25519 Pain in unspecified shoulder: Secondary | ICD-10-CM | POA: Diagnosis not present

## 2019-12-31 DIAGNOSIS — D696 Thrombocytopenia, unspecified: Secondary | ICD-10-CM | POA: Diagnosis present

## 2019-12-31 DIAGNOSIS — J939 Pneumothorax, unspecified: Secondary | ICD-10-CM | POA: Diagnosis not present

## 2019-12-31 DIAGNOSIS — E876 Hypokalemia: Secondary | ICD-10-CM

## 2019-12-31 DIAGNOSIS — N3000 Acute cystitis without hematuria: Secondary | ICD-10-CM

## 2019-12-31 DIAGNOSIS — Z20822 Contact with and (suspected) exposure to covid-19: Secondary | ICD-10-CM | POA: Diagnosis not present

## 2019-12-31 DIAGNOSIS — R4182 Altered mental status, unspecified: Secondary | ICD-10-CM | POA: Diagnosis not present

## 2019-12-31 DIAGNOSIS — Z90710 Acquired absence of both cervix and uterus: Secondary | ICD-10-CM | POA: Diagnosis not present

## 2019-12-31 DIAGNOSIS — J9811 Atelectasis: Secondary | ICD-10-CM | POA: Diagnosis not present

## 2019-12-31 DIAGNOSIS — R52 Pain, unspecified: Secondary | ICD-10-CM | POA: Diagnosis not present

## 2019-12-31 DIAGNOSIS — G9341 Metabolic encephalopathy: Secondary | ICD-10-CM | POA: Diagnosis not present

## 2019-12-31 DIAGNOSIS — S72011A Unspecified intracapsular fracture of right femur, initial encounter for closed fracture: Principal | ICD-10-CM | POA: Diagnosis present

## 2019-12-31 DIAGNOSIS — E538 Deficiency of other specified B group vitamins: Secondary | ICD-10-CM | POA: Diagnosis not present

## 2019-12-31 DIAGNOSIS — S72001D Fracture of unspecified part of neck of right femur, subsequent encounter for closed fracture with routine healing: Secondary | ICD-10-CM | POA: Diagnosis not present

## 2019-12-31 DIAGNOSIS — M25551 Pain in right hip: Secondary | ICD-10-CM | POA: Diagnosis not present

## 2019-12-31 DIAGNOSIS — E7800 Pure hypercholesterolemia, unspecified: Secondary | ICD-10-CM | POA: Diagnosis not present

## 2019-12-31 DIAGNOSIS — Z96641 Presence of right artificial hip joint: Secondary | ICD-10-CM | POA: Diagnosis not present

## 2019-12-31 DIAGNOSIS — Z8249 Family history of ischemic heart disease and other diseases of the circulatory system: Secondary | ICD-10-CM | POA: Diagnosis not present

## 2019-12-31 DIAGNOSIS — Z79899 Other long term (current) drug therapy: Secondary | ICD-10-CM

## 2019-12-31 DIAGNOSIS — W19XXXA Unspecified fall, initial encounter: Secondary | ICD-10-CM | POA: Diagnosis present

## 2019-12-31 DIAGNOSIS — N390 Urinary tract infection, site not specified: Secondary | ICD-10-CM

## 2019-12-31 DIAGNOSIS — M160 Bilateral primary osteoarthritis of hip: Secondary | ICD-10-CM | POA: Diagnosis not present

## 2019-12-31 DIAGNOSIS — R0902 Hypoxemia: Secondary | ICD-10-CM | POA: Diagnosis not present

## 2019-12-31 DIAGNOSIS — M8580 Other specified disorders of bone density and structure, unspecified site: Secondary | ICD-10-CM | POA: Diagnosis present

## 2019-12-31 DIAGNOSIS — J9691 Respiratory failure, unspecified with hypoxia: Secondary | ICD-10-CM

## 2019-12-31 DIAGNOSIS — D72829 Elevated white blood cell count, unspecified: Secondary | ICD-10-CM

## 2019-12-31 DIAGNOSIS — M069 Rheumatoid arthritis, unspecified: Secondary | ICD-10-CM | POA: Diagnosis not present

## 2019-12-31 DIAGNOSIS — I700 Atherosclerosis of aorta: Secondary | ICD-10-CM | POA: Diagnosis not present

## 2019-12-31 DIAGNOSIS — R404 Transient alteration of awareness: Secondary | ICD-10-CM | POA: Diagnosis not present

## 2019-12-31 DIAGNOSIS — M4726 Other spondylosis with radiculopathy, lumbar region: Secondary | ICD-10-CM | POA: Diagnosis present

## 2019-12-31 DIAGNOSIS — I517 Cardiomegaly: Secondary | ICD-10-CM | POA: Diagnosis not present

## 2019-12-31 DIAGNOSIS — I10 Essential (primary) hypertension: Secondary | ICD-10-CM

## 2019-12-31 LAB — CBC
HCT: 41.5 % (ref 36.0–46.0)
Hemoglobin: 13.4 g/dL (ref 12.0–15.0)
MCH: 30.6 pg (ref 26.0–34.0)
MCHC: 32.3 g/dL (ref 30.0–36.0)
MCV: 94.7 fL (ref 80.0–100.0)
Platelets: 149 10*3/uL — ABNORMAL LOW (ref 150–400)
RBC: 4.38 MIL/uL (ref 3.87–5.11)
RDW: 16 % — ABNORMAL HIGH (ref 11.5–15.5)
WBC: 11.7 10*3/uL — ABNORMAL HIGH (ref 4.0–10.5)
nRBC: 0 % (ref 0.0–0.2)

## 2019-12-31 LAB — APTT: aPTT: 32 seconds (ref 24–36)

## 2019-12-31 LAB — URINALYSIS, ROUTINE W REFLEX MICROSCOPIC
Bilirubin Urine: NEGATIVE
Glucose, UA: NEGATIVE mg/dL
Ketones, ur: 5 mg/dL — AB
Nitrite: NEGATIVE
Protein, ur: >=300 mg/dL — AB
Specific Gravity, Urine: 1.015 (ref 1.005–1.030)
pH: 6 (ref 5.0–8.0)

## 2019-12-31 LAB — DIFFERENTIAL
Abs Immature Granulocytes: 0.06 10*3/uL (ref 0.00–0.07)
Basophils Absolute: 0 10*3/uL (ref 0.0–0.1)
Basophils Relative: 0 %
Eosinophils Absolute: 0.1 10*3/uL (ref 0.0–0.5)
Eosinophils Relative: 1 %
Immature Granulocytes: 1 %
Lymphocytes Relative: 10 %
Lymphs Abs: 1.1 10*3/uL (ref 0.7–4.0)
Monocytes Absolute: 0.8 10*3/uL (ref 0.1–1.0)
Monocytes Relative: 7 %
Neutro Abs: 9.6 10*3/uL — ABNORMAL HIGH (ref 1.7–7.7)
Neutrophils Relative %: 81 %

## 2019-12-31 LAB — RAPID URINE DRUG SCREEN, HOSP PERFORMED
Amphetamines: NOT DETECTED
Barbiturates: NOT DETECTED
Benzodiazepines: NOT DETECTED
Cocaine: NOT DETECTED
Opiates: NOT DETECTED
Tetrahydrocannabinol: NOT DETECTED

## 2019-12-31 LAB — PROTIME-INR
INR: 1.1 (ref 0.8–1.2)
Prothrombin Time: 13.7 seconds (ref 11.4–15.2)

## 2019-12-31 LAB — COMPREHENSIVE METABOLIC PANEL
ALT: 17 U/L (ref 0–44)
AST: 23 U/L (ref 15–41)
Albumin: 4.1 g/dL (ref 3.5–5.0)
Alkaline Phosphatase: 58 U/L (ref 38–126)
Anion gap: 12 (ref 5–15)
BUN: 17 mg/dL (ref 8–23)
CO2: 26 mmol/L (ref 22–32)
Calcium: 8.8 mg/dL — ABNORMAL LOW (ref 8.9–10.3)
Chloride: 104 mmol/L (ref 98–111)
Creatinine, Ser: 0.58 mg/dL (ref 0.44–1.00)
GFR calc Af Amer: >60 mL/min (ref >60)
GFR calc non Af Amer: >60 mL/min (ref >60)
Glucose, Bld: 123 mg/dL — ABNORMAL HIGH (ref 70–99)
Potassium: 2.7 mmol/L — CL (ref 3.5–5.1)
Sodium: 142 mmol/L (ref 135–145)
Total Bilirubin: 1 mg/dL (ref 0.3–1.2)
Total Protein: 6.8 g/dL (ref 6.5–8.1)

## 2019-12-31 LAB — MAGNESIUM: Magnesium: 1.8 mg/dL (ref 1.7–2.4)

## 2019-12-31 LAB — ETHANOL: Alcohol, Ethyl (B): <10 mg/dL (ref <10)

## 2019-12-31 LAB — VITAMIN B12: Vitamin B-12: 94 pg/mL — ABNORMAL LOW (ref 180–914)

## 2019-12-31 LAB — SARS CORONAVIRUS 2 BY RT PCR (HOSPITAL ORDER, PERFORMED IN ~~LOC~~ HOSPITAL LAB): SARS Coronavirus 2: NEGATIVE

## 2019-12-31 LAB — TSH: TSH: 1.637 u[IU]/mL (ref 0.350–4.500)

## 2019-12-31 MED ORDER — POTASSIUM CHLORIDE CRYS ER 20 MEQ PO TBCR
40.0000 meq | EXTENDED_RELEASE_TABLET | Freq: Once | ORAL | Status: AC
  Administered 2019-12-31: 40 meq via ORAL
  Filled 2019-12-31: qty 2

## 2019-12-31 MED ORDER — POTASSIUM CHLORIDE 10 MEQ/100ML IV SOLN
10.0000 meq | INTRAVENOUS | Status: AC
  Administered 2019-12-31 (×2): 10 meq via INTRAVENOUS
  Filled 2019-12-31 (×2): qty 100

## 2019-12-31 MED ORDER — SODIUM CHLORIDE 0.9 % IV SOLN
1.0000 g | Freq: Once | INTRAVENOUS | Status: AC
  Administered 2020-01-01: 1 g via INTRAVENOUS
  Filled 2019-12-31: qty 10

## 2019-12-31 MED ORDER — SODIUM CHLORIDE 0.9 % IV BOLUS
500.0000 mL | Freq: Once | INTRAVENOUS | Status: AC
  Administered 2019-12-31: 500 mL via INTRAVENOUS

## 2019-12-31 MED ORDER — LORAZEPAM 2 MG/ML IJ SOLN
0.5000 mg | Freq: Once | INTRAMUSCULAR | Status: AC
  Administered 2019-12-31: 0.5 mg via INTRAVENOUS
  Filled 2019-12-31: qty 1

## 2019-12-31 MED ORDER — SODIUM CHLORIDE 0.9 % IV SOLN
100.0000 mL/h | INTRAVENOUS | Status: DC
  Administered 2019-12-31 – 2020-01-02 (×3): 100 mL/h via INTRAVENOUS

## 2019-12-31 MED ORDER — ENOXAPARIN SODIUM 40 MG/0.4ML ~~LOC~~ SOLN: 40.0000 mg | SUBCUTANEOUS | Status: DC

## 2019-12-31 MED ORDER — MAGNESIUM OXIDE 400 (241.3 MG) MG PO TABS
400.0000 mg | ORAL_TABLET | Freq: Once | ORAL | Status: AC
  Administered 2019-12-31: 400 mg via ORAL
  Filled 2019-12-31: qty 1

## 2019-12-31 NOTE — ED Notes (Signed)
Pt spit out Magnesium Tablet and Potassium Tablets and threw them on the floor. Pt not cooperating and accusing ED Staff of mistreatment. Pt making remarks about "wishing I could Pee on Faith, because she's so Purty." Pt increasingly becoming confused.

## 2019-12-31 NOTE — ED Notes (Signed)
Pt confused during assessment, claiming this RN is wearing a dress and smoking in the room. ED Provider at bedside to witness this conversation. Pt confused to situation and place but able to be reoriented.

## 2019-12-31 NOTE — ED Provider Notes (Signed)
[facility] EMERGENCY DEPARTMENT Provider Note   CSN: [id number] Arrival date & time: 12/31/19  1928     History Chief Complaint  Patient presents with  . Altered Mental Status  . Fall    Denise Mendoza is a 86 y.o. female.  HPI   Patient presented to the ED for evaluation of altered mental status.  According to the EMS report the patient was found lying on the ground in front of her sisters home.  Last time the patient was normal is unknown.  Patient normally lives by herself in her own home.  Patient was unable to clearly tell EMS why she was there.  Apparently her story changed.  When I asked the patient what happened that brought her into the ED today she talked to me about being at a court house.  Patient does complain of some pain in her right shoulder as well as some difficulty lifting her right leg.  She denies any chest pain shortness of breath.  No fevers or chills.  Past Medical History:  Diagnosis Date  . Hyperlipidemia   . Hypertension   . Lower back pain Oct 2013  . Occlusive disease, arterial   . Peripheral vascular disease (HCC)   . Stress Oct. 2013   Dr. Ignatius Specking    Patient Active Problem List   Diagnosis Date Noted  . Bilateral leg pain 05/12/2012  . Hip pain 05/12/2012    Past Surgical History:  Procedure Laterality Date  . ABDOMINAL HYSTERECTOMY    . BACK SURGERY    . NOSE SURGERY    . TUMOR REMOVED FROM FINGER       OB History   No obstetric history on file.     Family History  Problem Relation Age of Onset  . Heart disease Mother   . Other Father        MACULARDEGENERATION    Social History   Tobacco Use  . Smoking status: Current Every Day Smoker    Packs/day: 1.00    Years: 30.00    Pack years: 30.00    Types: Cigarettes  . Smokeless tobacco: Never Used  Substance Use Topics  . Alcohol use: No  . Drug use: No    Home Medications Prior to Admission medications   Medication Sig Start Date End Date Taking?  Authorizing Provider  ALPRAZolam (XANAX) 0.25 MG tablet Take 1 mg by mouth at bedtime as needed.    [provider]  Cholecalciferol 2000 UNITS TABS Take by mouth.    [provider]  gabapentin (NEURONTIN) 300 MG capsule Take 300 mg by mouth 3 (three) times daily.    [provider]  HYDROcodone-acetaminophen (LORTAB) 7.5-500 MG per tablet Take 1 tablet by mouth every 6 (six) hours as needed.    [provider]  metoprolol tartrate (LOPRESSOR) 25 MG tablet Take 25 mg by mouth 2 (two) times daily.    [provider]  simvastatin (ZOCOR) 40 MG tablet Take 40 mg by mouth every evening.    [provider]  traZODone (DESYREL) 50 MG tablet Take 50 mg by mouth at bedtime.    [provider]  zoledronic acid (RECLAST) 5 MG/100ML SOLN Inject 5 mg into the vein once.    [provider]    Allergies    Patient has no known allergies.  Review of Systems   Review of Systems  All other systems reviewed and are negative.   Physical Exam Updated Vital Signs  BP (!) 159/114   Pulse 105   Temp 99.2 F (37.3 C) (Oral)   Resp (!) 27   Ht 1.575 m (5\' 2" )   Wt 49 kg   SpO2 91%   BMI 19.75 kg/m   Physical Exam Vitals and nursing note reviewed.  Constitutional:      General: She is not in acute distress.    Appearance: She is well-developed.  HENT:     Head: Normocephalic and atraumatic.     Right Ear: External ear normal.     Left Ear: External ear normal.  Eyes:     General: No scleral icterus.       Right eye: No discharge.        Left eye: No discharge.     Conjunctiva/sclera: Conjunctivae normal.  Neck:     Trachea: No tracheal deviation.  Cardiovascular:     Rate and Rhythm: Normal rate and regular rhythm.  Pulmonary:     Effort: Pulmonary effort is normal. No respiratory distress.     Breath sounds: Normal breath sounds. No stridor. No wheezing or rales.  Abdominal:     General: Bowel sounds are normal.  There is no distension.     Palpations: Abdomen is soft.     Tenderness: There is no abdominal tenderness. There is no guarding or rebound.  Musculoskeletal:        General: No tenderness.     Cervical back: Neck supple.     Comments: Mild tenderness palpation right shoulder, patient is able to lift her arm without difficulty  Skin:    General: Skin is warm and dry.     Findings: No rash.  Neurological:     Mental Status: She is alert and oriented to person, place, and time.     Cranial Nerves: No cranial nerve deficit (no facial droop, extraocular movements intact, no slurred speech).     Sensory: No sensory deficit.     Motor: No abnormal muscle tone or seizure activity.     Coordination: Coordination normal.     Comments: No pronator drift bilateral upper extrem, unable to hold right leg off bed for 5 seconds, sensation intact in all extremities, no visual field cuts, no left or right sided neglect, , no nystagmus noted      ED Results / Procedures / Treatments   Labs (all labs ordered are listed, but only abnormal results are displayed) Labs Reviewed  CBC - Abnormal; Notable for the following components:      Result Value   WBC 11.7 (*)    RDW 16.0 (*)    Platelets 149 (*)    All other components within normal limits  DIFFERENTIAL - Abnormal; Notable for the following components:   Neutro Abs 9.6 (*)    All other components within normal limits  COMPREHENSIVE METABOLIC PANEL - Abnormal; Notable for the following components:   Potassium 2.7 (*)    Glucose, Bld 123 (*)    Calcium 8.8 (*)    All other components within normal limits  SARS CORONAVIRUS 2 BY RT PCR (HOSPITAL ORDER, PERFORMED IN [facility])  ETHANOL  PROTIME-INR  APTT  RAPID URINE DRUG SCREEN, HOSP PERFORMED  URINALYSIS, ROUTINE W REFLEX MICROSCOPIC    EKG EKG Interpretation  Date/Time:  Friday December 31 2019 19:48:10 EDT Ventricular Rate:  81 PR Interval:    QRS Duration: 104 QT  Interval:  363 QTC Calculation: 422 R Axis:   0 Text Interpretation: Sinus rhythm  Left atrial enlargement LVH with secondary repolarization abnormality No significant change since last tracing Confirmed by Linwood Dibbles [phone]) on 12/31/2019 10:16:02 PM   Radiology DG Shoulder Right  Result Date: 12/31/2019 CLINICAL DATA:  Fall EXAM: RIGHT SHOULDER - 2+ VIEW COMPARISON:  None. FINDINGS: The patient is status post ORIF with plate screw fixation of the proximal humerus with a healed fracture deformity. No periprosthetic lucency or acute fracture is identified. There is diffuse osteopenia. Mild cardiomegaly and aortic knob calcifications are noted. IMPRESSION: Status post ORIF of the proximal humerus without complication. Electronically Signed   By: Jonna Clark M.D.   On: 12/31/2019 20:46   CT HEAD WO CONTRAST  Result Date: 12/31/2019 CLINICAL DATA:  Change in mental status EXAM: CT HEAD WITHOUT CONTRAST TECHNIQUE: Contiguous axial images were obtained from the base of the skull through the vertex without intravenous contrast. COMPARISON:  July 20, 2019 FINDINGS: Brain: No evidence of acute territorial infarction, hemorrhage, hydrocephalus,extra-axial collection or mass lesion/mass effect. There is dilatation the ventricles and sulci consistent with age-related atrophy. Low-attenuation changes in the deep white matter consistent with small vessel ischemia. Vascular: No hyperdense vessel or unexpected calcification. Skull: The skull is intact. No fracture or focal lesion identified. Sinuses/Orbits: The visualized paranasal sinuses and mastoid air cells are clear. The orbits and globes intact. Other: None IMPRESSION: No acute intracranial abnormality. Findings consistent with age related atrophy and chronic small vessel ischemia Electronically Signed   By: Jonna Clark M.D.   On: 12/31/2019 21:00   DG Chest Portable 1 View  Result Date: 12/31/2019 CLINICAL DATA:  Fall EXAM: PORTABLE CHEST 1 VIEW  COMPARISON:  July 20, 2019 FINDINGS: There is mild cardiomegaly. Aortic knob calcifications are seen. Both lungs are clear. The visualized skeletal structures are unremarkable. IMPRESSION: No active disease. Electronically Signed   By: Jonna Clark M.D.   On: 12/31/2019 20:56    Procedures Procedures (including critical care time)  Medications Ordered in ED Medications  sodium chloride 0.9 % bolus 500 mL (500 mLs Intravenous New Bag/Given 12/31/19 2017)    Followed by  0.9 %  sodium chloride infusion (100 mL/hr Intravenous New Bag/Given 12/31/19 2017)  potassium chloride 10 mEq in 100 mL IVPB (10 mEq Intravenous New Bag/Given 12/31/19 2149)  LORazepam (ATIVAN) injection 0.5 mg (has no administration in time range)  potassium chloride SA (KLOR-CON) CR tablet 40 mEq (40 mEq Oral Given 12/31/19 2149)  magnesium oxide (MAG-OX) tablet 400 mg (400 mg Oral Given 12/31/19 2149)  LORazepam (ATIVAN) injection 0.5 mg (0.5 mg Intravenous Given 12/31/19 2216)    ED Course  I have reviewed the triage vital signs and the nursing notes.  Pertinent labs & imaging results that were available during my care of the patient were reviewed by me and considered in my medical decision making (see chart for details).  Clinical Course as of Dec 31 2250  Fri Dec 31, 2019  2008 Patient is LVO negative.  Not a TPA candidate.  Last seen normal is unknown   [JK]  2133 Shoulder x-ray without acute findings   [JK]  2133 Chest x-ray without acute findings   [JK]  2133 No acute findings noted on head CT   [JK]  2133 Hypokalemia noted on lecture light panel.  CBC shows an elevated white blood cell count   [JK]  2241 AST: 23 [JK]  2248 Patient reexamined.  She does appear more agitated and altered.  Will give additional dose of Ativan.   [JK]  2248 Discussed with patient's family member.  Patient did have a similar episode in the past.  Patient lives alone at home.  Son checks on her but they are currently away at the  beach.   [JK]    Clinical Course User Index [JK] Linwood Dibbles, MD   MDM Rules/Calculators/A&P                          Patient presents ED for altered mental status.  Patient was found lying in her sister's yard.  Unclear how long she has been confused.  Initially in the ED the patient was more coherent.  As she has been here she seems more confused and agitated.  No acute abnormality noted on CT scan.  Patient is hypokalemic but I doubt that would not cause her behavior change.  No signs of alcohol intoxication.  No significant electrolyte abnormalities otherwise.  Urinalysis with possible uti.  Will consult with medical service for admission, further evaluation.  Final Clinical Impression(s) / ED Diagnoses Final diagnoses:  Hypokalemia  Delirium    Rx / DC Orders ED Discharge Orders    None       Linwood Dibbles, MD 12/31/19 2324

## 2019-12-31 NOTE — ED Notes (Signed)
ED Provider at bedside. 

## 2019-12-31 NOTE — ED Notes (Signed)
Pt becoming aggressive and verbally abusive. MD Notified. Soft restraints applied.

## 2019-12-31 NOTE — ED Notes (Signed)
Date and time results received: 12/31/19 2052  (use smartphrase ".now" to insert current time)  Test: Potassium Critical Value: 2.7  Name of Provider Notified: Dr Roselyn Bering  Orders Received? Or Actions Taken?:

## 2019-12-31 NOTE — ED Triage Notes (Addendum)
Pt was found in front yard of sisters home laying on her back. Pt c/o of right shoulder pain with EMS. Pt gave EMS multiple stories about why she was laying in the yard.

## 2019-12-31 NOTE — ED Notes (Signed)
Pt transferred to radiology.

## 2019-12-31 NOTE — H&P (Signed)
History and Physical  Denise Mendoza JJK:[id number] DOB: 28-Apr-1932 DOA: 12/31/2019  Referring physician: Linwood Dibbles, MD PCP: Ignatius Specking, MD  Patient coming from: Home  Chief Complaint: Altered mental status  HPI: Denise Mendoza is a 86 y.o. female with medical history significant for hypertension, hyperlipidemia and disc degenerative disease and lumbar spondylosis with near right L4-L5 radiculopathy status post laminectomy and discectomy (2012) who presents to the emergency department via EMS due to altered mental status.  Patient was unable to provide history possibly secondary to altered mental status.  History was obtained from ED physician and ED medical record.  Per report, patient was found lying on the ground in front of her sisters home, it was unknown if she had a fall, last well known time was uncertain.  Patient was not oriented to place when interviewed by ED physician (she thought she was at a  court house), there was complaint regarding right shoulder pain and difficulty in being able to lift right leg.  She was reported to deny chest pain, shortness of breath, fever, chills  ED Course: In the emergency department, she was tachycardic and was at 1 time tachypneic.  Work-up in the ED showed mild leukocytosis, thrombocytopenia, hypokalemia, hypocalcemia, hyperglycemia.  Urinalysis showed proteinuria, moderate urine hemoglobin, small leukocytes and rare bacteria.  Vitamin B12 94.  ABG showed hypoxia with PO2 of 59.5.  Alcohol level was <10, TSH 1.637.  SARS coronavirus 2 was negative.  CT of head without contrast showed no acute intracranial normality.  Chest x-ray showed no active disease.  Right shoulder x-ray showed status post ORIF of the proximal marrows without complication.  Right hip x-ray showed impacted subcapital femoral neck fracture on the right.  She was empirically treated with IV ceftriaxone due to presumed UTI in the setting of altered mental status.  IV Ativan 0.5 Mg  x2 was given.  Potassium was replenished and IV NS 1 L of NS was given.  Hospitalist was asked to admit.  For further evaluation and management.  At bedside, patient's confusion appeared to have worsened despite 2 doses of IV Ativan, she was already placed on bilateral soft wrist restraints, she appeared to be in distress due to increased work of breathing, supplemental oxygen at 4 LPM was provided.  Patient was laying in bed in a right lateral position with HOB raised to 30 degrees, it was then recommended to change patient's positioning considering impacted subcapital femoral neck fracture on the right.  Breathing treatment and IV Solu-Medrol was provided due to increased work of breathing that appears to be more around her neck area.   At baseline, patient lives alone and able to take care of ADLs.  Sister lives close to her and checks on her regularly.  Review of Systems: This cannot be obtained at this time due to patient's current condition  Past Medical History:  Diagnosis Date  . Hyperlipidemia   . Hypertension   . Lower back pain Oct 2013  . Occlusive disease, arterial   . Peripheral vascular disease (HCC)   . Stress Oct. 2013   Dr. Ignatius Specking   Past Surgical History:  Procedure Laterality Date  . ABDOMINAL HYSTERECTOMY    . BACK SURGERY    . NOSE SURGERY    . TUMOR REMOVED FROM FINGER      Social History:  reports that she has been smoking cigarettes. She has a 30.00 pack-year smoking history. She has never used smokeless tobacco. She reports that  she does not drink alcohol and does not use drugs.   No Known Allergies  Family History  Problem Relation Age of Onset  . Heart disease Mother   . Other Father        MACULARDEGENERATION    Prior to Admission medications   Medication Sig Start Date End Date Taking? Authorizing Provider  ALPRAZolam (XANAX) 0.25 MG tablet Take 1 mg by mouth at bedtime as needed.    [provider]  Cholecalciferol 2000 UNITS  TABS Take by mouth.    [provider]  gabapentin (NEURONTIN) 300 MG capsule Take 300 mg by mouth 3 (three) times daily.    [provider]  HYDROcodone-acetaminophen (LORTAB) 7.5-500 MG per tablet Take 1 tablet by mouth every 6 (six) hours as needed.    [provider]  metoprolol tartrate (LOPRESSOR) 25 MG tablet Take 25 mg by mouth 2 (two) times daily.    [provider]  simvastatin (ZOCOR) 40 MG tablet Take 40 mg by mouth every evening.    [provider]  traZODone (DESYREL) 50 MG tablet Take 50 mg by mouth at bedtime.    [provider]  zoledronic acid (RECLAST) 5 MG/100ML SOLN Inject 5 mg into the vein once.    [provider]    Physical Exam: BP (!) 154/67   Pulse (!) 110   Temp 99.9 F (37.7 C) (Axillary)   Resp 20   Ht 5\' 3"  (1.6 m)   Wt 48.8 kg   SpO2 94%   BMI 19.06 kg/m   . General: 86 y.o. year-old female well developed well nourished in no acute distress.  Alert and oriented x3. 84 HEENT: Dry mucous membrane, NCAT . Neck: Supple, trachea medial . Cardiovascular: Tachycardia, regular rate and rhythm with no rubs or gallops.  No thyromegaly or JVD noted.  No lower extremity edema. 2/4 pulses in all 4 extremities. Marland Kitchen Respiratory: Expiratory wheezing in neck area, no rales or rhonchi on auscultation.  . Abdomen: Soft nontender nondistended with normal bowel sounds x4 quadrants. . Muskuloskeletal: No cyanosis, clubbing or edema noted bilaterally . Neuro: This cannot be evaluated at this time due to patient being agitated and uncooperative.  . Skin: No ulcerative lesions noted or rashes Psychiatry: This cannot be evaluated at this time due to patient's current condition         Labs on Admission:  Basic Metabolic Panel: Recent Labs  Lab 12/31/19 1955 12/31/19 2158  NA 142  --   K 2.7*  --   CL 104  --   CO2 26  --   GLUCOSE 123*  --   BUN 17  --   CREATININE 0.58  --   CALCIUM 8.8*  --   MG  --   1.8   Liver Function Tests: Recent Labs  Lab 12/31/19 1955  AST 23  ALT 17  ALKPHOS 58  BILITOT 1.0  PROT 6.8  ALBUMIN 4.1   No results for input(s): LIPASE, AMYLASE in the last 168 hours. No results for input(s): AMMONIA in the last 168 hours. CBC: Recent Labs  Lab 12/31/19 1955  WBC 11.7*  NEUTROABS 9.6*  HGB 13.4  HCT 41.5  MCV 94.7  PLT 149*   Cardiac Enzymes: No results for input(s): CKTOTAL, CKMB, CKMBINDEX, TROPONINI in the last 168 hours.  BNP (last 3 results) No results for input(s): BNP in the last 8760 hours.  ProBNP (last 3 results) No results for input(s): PROBNP in the last  8760 hours.  CBG: No results for input(s): GLUCAP in the last 168 hours.  Radiological Exams on Admission: DG Shoulder Right  Result Date: 12/31/2019 CLINICAL DATA:  Fall EXAM: RIGHT SHOULDER - 2+ VIEW COMPARISON:  None. FINDINGS: The patient is status post ORIF with plate screw fixation of the proximal humerus with a healed fracture deformity. No periprosthetic lucency or acute fracture is identified. There is diffuse osteopenia. Mild cardiomegaly and aortic knob calcifications are noted. IMPRESSION: Status post ORIF of the proximal humerus without complication. Electronically Signed   By: Jonna Clark M.D.   On: 12/31/2019 20:46   CT HEAD WO CONTRAST  Result Date: 12/31/2019 CLINICAL DATA:  Change in mental status EXAM: CT HEAD WITHOUT CONTRAST TECHNIQUE: Contiguous axial images were obtained from the base of the skull through the vertex without intravenous contrast. COMPARISON:  July 20, 2019 FINDINGS: Brain: No evidence of acute territorial infarction, hemorrhage, hydrocephalus,extra-axial collection or mass lesion/mass effect. There is dilatation the ventricles and sulci consistent with age-related atrophy. Low-attenuation changes in the deep white matter consistent with small vessel ischemia. Vascular: No hyperdense vessel or unexpected calcification. Skull: The skull is intact.  No fracture or focal lesion identified. Sinuses/Orbits: The visualized paranasal sinuses and mastoid air cells are clear. The orbits and globes intact. Other: None IMPRESSION: No acute intracranial abnormality. Findings consistent with age related atrophy and chronic small vessel ischemia Electronically Signed   By: Jonna Clark M.D.   On: 12/31/2019 21:00   DG Chest Portable 1 View  Result Date: 12/31/2019 CLINICAL DATA:  Fall EXAM: PORTABLE CHEST 1 VIEW COMPARISON:  July 20, 2019 FINDINGS: There is mild cardiomegaly. Aortic knob calcifications are seen. Both lungs are clear. The visualized skeletal structures are unremarkable. IMPRESSION: No active disease. Electronically Signed   By: Jonna Clark M.D.   On: 12/31/2019 20:56   DG Hip Unilat W or Wo Pelvis 2-3 Views Right  Result Date: 12/31/2019 CLINICAL DATA:  Found lying in yard, hip pain, initial encounter EXAM: DG HIP (WITH OR WITHOUT PELVIS) 3V RIGHT COMPARISON:  None. FINDINGS: Pelvic ring is intact. Impacted subcapital femoral neck fracture on the right is noted. No dislocation is seen. No soft tissue abnormality is noted. IMPRESSION: Impacted subcapital femoral neck fracture on the right. Electronically Signed   By: Alcide Clever M.D.   On: 12/31/2019 23:47    EKG: I independently viewed the EKG done and my findings are as followed: Sinus rhythm at 81 bpm with left atrial enlargement and left ventricle hypertrophy  Assessment/Plan Present on Admission: . Altered mental status  Principal Problem:   Altered mental status Active Problems:   Fracture of femoral neck, right (HCC)   Hypokalemia   Dehydration   Respiratory failure with hypoxia (HCC)   Vitamin B12 deficiency   Thrombocytopenia (HCC)   Leukocytosis   Acute lower UTI   Essential hypertension   Hyperlipidemia   Altered mental status secondary to multifactorial Patient was confused and agitated, she was unable to engage in an interview at bedside.  At baseline,  patient lives alone and able to take care of ADLs. She was suspected to have UTI and started on antibiotics Urine drug screen was negative for any drug intoxication Ethanol level was < 10 Patient was placed on bilateral soft wrist restraints due to agitation and confusion Continue fall precaution, seizure precaution, aspiration precaution and neurochecks  Acute respiratory failure with hypoxia ABG: 7.392/43.2/59.5/25.2 This may be due to patient being agitated/respiratory distress/pain (patient does not use  oxygen at baseline)  Breathing treatment with Xopenex and Atrovent was provided Continue Xopenex and Atrovent every 6 hours as needed IV Solu-Medrol 20 mg x 1 was given with improved breathing effort Continue supplemental oxygen at this time to maintain O2 sat > 92%, will plan to wean patient off supplemental oxygen as tolerated  Right femoral neck fracture Right hip x-ray showed Impacted subcapital femoral neck fracture on the right Continue IV morphine 2 mg every 4 hours as needed Continue fall precaution and neurochecks Continue PT/OT eval and treat Orthopedic surgeon will be consulted for further evaluation and management. Patient is currently n.p.o in anticipation for possible surgical procedure.  Hypokalemia K+ 2.7, this was replenished  Dehydration Continue IV hydration  Leukocytosis/thrombocytopenia possibly reactive WBC 11.7; platelets are 149 Continue to monitor CBC with morning labs  Questionable UTI POA Due to patient's altered mental status, agitation and inability to provide history at this time, she was suspected to have a UTI, though UA was not convincing (protein > 300, leukocytes small, bacteria, hemoglobin moderate). Patient was started on IV ceftriaxone, we shall empirically continue with same at this time with plan to de-escalate based on urine culture. Urine culture pending  Vitamin B12 deficiency Vitamin B12 94; vitamin B12 1000 mcg IM x1 will be  given in the morning, with plan to transition to p.o. meds once patient is more alert and able to tolerate oral intake  Essential hypertension (uncontrolled) Continue IV labetalol every 6 hours as needed for SBP > 170  Hyperlipidemia Continue home Zocor when patient is able to tolerate oral intake   DVT prophylaxis: SCDs  Code Status: Full code  Family Communication: None at bedside  Disposition Plan:  Patient is from:                        home Anticipated DC to:                   SNF or family members home Anticipated DC date:               2-3 days Anticipated DC barriers:         Patient unstable to discharge at this time due to altered mental status and right femoral neck fracture requiring orthopedic surgeon consult and recommendation   Consults called: Orthopedic surgery  Admission status: Inpatient   Frankey Shown MD [facility]  If 7PM-7AM, please contact night-coverage [email or web address]  01/01/2020, 3:59 AM

## 2020-01-01 ENCOUNTER — Encounter (HOSPITAL_COMMUNITY): Payer: Self-pay | Admitting: Internal Medicine

## 2020-01-01 DIAGNOSIS — S72001D Fracture of unspecified part of neck of right femur, subsequent encounter for closed fracture with routine healing: Secondary | ICD-10-CM

## 2020-01-01 DIAGNOSIS — J9691 Respiratory failure, unspecified with hypoxia: Secondary | ICD-10-CM

## 2020-01-01 DIAGNOSIS — D72829 Elevated white blood cell count, unspecified: Secondary | ICD-10-CM

## 2020-01-01 DIAGNOSIS — D696 Thrombocytopenia, unspecified: Secondary | ICD-10-CM

## 2020-01-01 DIAGNOSIS — S72001A Fracture of unspecified part of neck of right femur, initial encounter for closed fracture: Secondary | ICD-10-CM

## 2020-01-01 DIAGNOSIS — E876 Hypokalemia: Secondary | ICD-10-CM

## 2020-01-01 DIAGNOSIS — N390 Urinary tract infection, site not specified: Secondary | ICD-10-CM

## 2020-01-01 DIAGNOSIS — R4182 Altered mental status, unspecified: Secondary | ICD-10-CM

## 2020-01-01 DIAGNOSIS — E538 Deficiency of other specified B group vitamins: Secondary | ICD-10-CM

## 2020-01-01 DIAGNOSIS — I10 Essential (primary) hypertension: Secondary | ICD-10-CM

## 2020-01-01 DIAGNOSIS — E785 Hyperlipidemia, unspecified: Secondary | ICD-10-CM

## 2020-01-01 DIAGNOSIS — E860 Dehydration: Secondary | ICD-10-CM

## 2020-01-01 LAB — CBC
HCT: 38 % (ref 36.0–46.0)
Hemoglobin: 11.4 g/dL — ABNORMAL LOW (ref 12.0–15.0)
MCH: 29.5 pg (ref 26.0–34.0)
MCHC: 30 g/dL (ref 30.0–36.0)
MCV: 98.4 fL (ref 80.0–100.0)
Platelets: 87 10*3/uL — ABNORMAL LOW (ref 150–400)
RBC: 3.86 MIL/uL — ABNORMAL LOW (ref 3.87–5.11)
RDW: 16 % — ABNORMAL HIGH (ref 11.5–15.5)
WBC: 11.5 10*3/uL — ABNORMAL HIGH (ref 4.0–10.5)
nRBC: 0.3 % — ABNORMAL HIGH (ref 0.0–0.2)

## 2020-01-01 LAB — COMPREHENSIVE METABOLIC PANEL
ALT: 16 U/L (ref 0–44)
AST: 18 U/L (ref 15–41)
Albumin: 3.2 g/dL — ABNORMAL LOW (ref 3.5–5.0)
Alkaline Phosphatase: 45 U/L (ref 38–126)
Anion gap: 13 (ref 5–15)
BUN: 9 mg/dL (ref 8–23)
CO2: 22 mmol/L (ref 22–32)
Calcium: 7.7 mg/dL — ABNORMAL LOW (ref 8.9–10.3)
Chloride: 104 mmol/L (ref 98–111)
Creatinine, Ser: 0.43 mg/dL — ABNORMAL LOW (ref 0.44–1.00)
GFR calc Af Amer: >60 mL/min (ref >60)
GFR calc non Af Amer: >60 mL/min (ref >60)
Glucose, Bld: 132 mg/dL — ABNORMAL HIGH (ref 70–99)
Potassium: 2.4 mmol/L — CL (ref 3.5–5.1)
Sodium: 139 mmol/L (ref 135–145)
Total Bilirubin: 1.2 mg/dL (ref 0.3–1.2)
Total Protein: 5.5 g/dL — ABNORMAL LOW (ref 6.5–8.1)

## 2020-01-01 LAB — BLOOD GAS, ARTERIAL
Acid-Base Excess: 1.3 mmol/L (ref 0.0–2.0)
Bicarbonate: 25.2 mmol/L (ref 20.0–28.0)
FIO2: 32
O2 Saturation: 86.9 %
Patient temperature: 37
pCO2 arterial: 43.2 mmHg (ref 32.0–48.0)
pH, Arterial: 7.392 (ref 7.350–7.450)
pO2, Arterial: 59.5 mmHg — ABNORMAL LOW (ref 83.0–108.0)

## 2020-01-01 LAB — PROTIME-INR
INR: 1.3 — ABNORMAL HIGH (ref 0.8–1.2)
Prothrombin Time: 15.3 seconds — ABNORMAL HIGH (ref 11.4–15.2)

## 2020-01-01 LAB — FOLATE: Folate: 6.3 ng/mL (ref >5.9)

## 2020-01-01 LAB — APTT: aPTT: 33 seconds (ref 24–36)

## 2020-01-01 LAB — PHOSPHORUS: Phosphorus: 2.9 mg/dL (ref 2.5–4.6)

## 2020-01-01 MED ORDER — METHYLPREDNISOLONE SODIUM SUCC 40 MG IJ SOLR
20.0000 mg | Freq: Two times a day (BID) | INTRAMUSCULAR | Status: DC
  Administered 2020-01-01 – 2020-01-02 (×2): 20 mg via INTRAVENOUS
  Filled 2020-01-01 (×2): qty 1

## 2020-01-01 MED ORDER — IPRATROPIUM BROMIDE 0.02 % IN SOLN: 0.5000 mg | Freq: Four times a day (QID) | RESPIRATORY_TRACT | Status: DC | PRN

## 2020-01-01 MED ORDER — MORPHINE SULFATE (PF) 2 MG/ML IV SOLN
2.0000 mg | INTRAVENOUS | Status: DC | PRN
  Administered 2020-01-01 (×2): 2 mg via INTRAVENOUS
  Filled 2020-01-01 (×2): qty 1

## 2020-01-01 MED ORDER — SODIUM CHLORIDE 0.9 % IV SOLN: Freq: Once | INTRAVENOUS | Status: AC

## 2020-01-01 MED ORDER — ALBUTEROL SULFATE (2.5 MG/3ML) 0.083% IN NEBU: 2.5000 mg | INHALATION_SOLUTION | Freq: Four times a day (QID) | RESPIRATORY_TRACT | Status: DC | PRN

## 2020-01-01 MED ORDER — CYANOCOBALAMIN 1000 MCG/ML IJ SOLN
1000.0000 ug | Freq: Once | INTRAMUSCULAR | Status: AC
  Administered 2020-01-01: 1000 ug via INTRAMUSCULAR
  Filled 2020-01-01: qty 1

## 2020-01-01 MED ORDER — SODIUM CHLORIDE 0.9 % IV SOLN
1.0000 g | INTRAVENOUS | Status: AC
  Administered 2020-01-01 – 2020-01-04 (×3): 1 g via INTRAVENOUS
  Filled 2020-01-01 (×3): qty 10

## 2020-01-01 MED ORDER — IPRATROPIUM-ALBUTEROL 0.5-2.5 (3) MG/3ML IN SOLN
3.0000 mL | Freq: Three times a day (TID) | RESPIRATORY_TRACT | Status: DC
  Administered 2020-01-01: 3 mL via RESPIRATORY_TRACT
  Filled 2020-01-01: qty 3

## 2020-01-01 MED ORDER — ALBUTEROL SULFATE (2.5 MG/3ML) 0.083% IN NEBU: 2.5000 mg | INHALATION_SOLUTION | Freq: Four times a day (QID) | RESPIRATORY_TRACT | Status: DC

## 2020-01-01 MED ORDER — POTASSIUM CHLORIDE CRYS ER 20 MEQ PO TBCR
40.0000 meq | EXTENDED_RELEASE_TABLET | Freq: Three times a day (TID) | ORAL | Status: AC
  Administered 2020-01-01 (×3): 40 meq via ORAL
  Filled 2020-01-01 (×3): qty 2

## 2020-01-01 MED ORDER — METHYLPREDNISOLONE SODIUM SUCC 40 MG IJ SOLR
20.0000 mg | Freq: Once | INTRAMUSCULAR | Status: AC
  Administered 2020-01-01: 20 mg via INTRAVENOUS
  Filled 2020-01-01: qty 1

## 2020-01-01 MED ORDER — IPRATROPIUM BROMIDE 0.02 % IN SOLN: 0.5000 mg | Freq: Four times a day (QID) | RESPIRATORY_TRACT | Status: DC

## 2020-01-01 MED ORDER — LEVALBUTEROL HCL 1.25 MG/0.5ML IN NEBU
INHALATION_SOLUTION | RESPIRATORY_TRACT | Status: AC
  Administered 2020-01-01: 1.25 mg
  Filled 2020-01-01: qty 0.5

## 2020-01-01 MED ORDER — ACETAMINOPHEN 325 MG PO TABS
650.0000 mg | ORAL_TABLET | Freq: Four times a day (QID) | ORAL | Status: DC | PRN
  Administered 2020-01-02: 650 mg via ORAL
  Filled 2020-01-01: qty 2

## 2020-01-01 MED ORDER — BUDESONIDE 0.25 MG/2ML IN SUSP
0.2500 mg | Freq: Two times a day (BID) | RESPIRATORY_TRACT | Status: DC
  Administered 2020-01-01 – 2020-01-02 (×2): 0.25 mg via RESPIRATORY_TRACT
  Filled 2020-01-01 (×2): qty 2

## 2020-01-01 MED ORDER — IPRATROPIUM BROMIDE 0.02 % IN SOLN
0.5000 mg | Freq: Once | RESPIRATORY_TRACT | Status: AC
  Administered 2020-01-01: 0.5 mg via RESPIRATORY_TRACT
  Filled 2020-01-01: qty 2.5

## 2020-01-01 MED ORDER — POTASSIUM CHLORIDE 10 MEQ/100ML IV SOLN
10.0000 meq | INTRAVENOUS | Status: AC
  Administered 2020-01-01 (×6): 10 meq via INTRAVENOUS
  Filled 2020-01-01 (×6): qty 100

## 2020-01-01 MED ORDER — LABETALOL HCL 5 MG/ML IV SOLN: 5.0000 mg | Freq: Four times a day (QID) | INTRAVENOUS | Status: DC | PRN

## 2020-01-01 MED ORDER — HYDROMORPHONE HCL 1 MG/ML IJ SOLN
0.5000 mg | INTRAMUSCULAR | Status: DC | PRN
  Administered 2020-01-01 – 2020-01-04 (×10): 0.5 mg via INTRAVENOUS
  Filled 2020-01-01 (×10): qty 0.5

## 2020-01-01 NOTE — H&P (View-Only) (Signed)
HOSPITAL CONSULT  [facility]    Patient ID: Denise Mendoza, female   DOB: 10/12/1931, 87 y.o.   MRN: [id number]  New patient  Requested by: Dr Pratik Shah  Reason for: farcture right hip   Based on the information below I recommend internal fixation right hip  Chief Complaint  Patient presents with  . Altered Mental Status  . Fall     HPI   She is still unable to give a history   Chief Complaint: Altered mental status   HPI: Denise Mendoza is a 87 y.o. female with medical history significant for hypertension, hyperlipidemia and disc degenerative disease and lumbar spondylosis with near right L4-L5 radiculopathy status post laminectomy and discectomy (2012) who presents to the emergency department via EMS due to altered mental status.  Patient was unable to provide history possibly secondary to altered mental status.  History was obtained from ED physician and ED medical record.  Per report, patient was found lying on the ground in front of her sisters home, it was unknown if she had a fall, last well known time was uncertain.  Patient was not oriented to place when interviewed by ED physician (she thought she was at a  court house), there was complaint regarding right shoulder pain and difficulty in being able to lift right leg.  She was reported to deny chest pain, shortness of breath, fever, chills   ED Course: In the emergency department, she was tachycardic and was at 1 time tachypneic.  Work-up in the ED showed mild leukocytosis, thrombocytopenia, hypokalemia, hypocalcemia, hyperglycemia.  Urinalysis showed proteinuria, moderate urine hemoglobin, small leukocytes and rare bacteria.  Vitamin B12 94.  ABG showed hypoxia with PO2 of 59.5.  Alcohol level was <10, TSH 1.637.  SARS coronavirus 2 was negative.  CT of head without contrast showed no acute intracranial normality.  Chest x-ray showed no active disease.  Right shoulder x-ray showed status post ORIF of the  proximal marrows without complication.  Right hip x-ray showed impacted subcapital femoral neck fracture on the right.  She was empirically treated with IV ceftriaxone due to presumed UTI in the setting of altered mental status.  IV Ativan 0.5 Mg x2 was given.  Potassium was replenished and IV NS 1 L of NS was given.  Hospitalist was asked to admit.  For further evaluation and management.   At bedside, patient's confusion appeared to have worsened despite 2 doses of IV Ativan, she was already placed on bilateral soft wrist restraints, she appeared to be in distress due to increased work of breathing, supplemental oxygen at 4 LPM was provided.  Patient was laying in bed in a right lateral position with HOB raised to 30 degrees, it was then recommended to change patient's positioning considering impacted subcapital femoral neck fracture on the right.  Breathing treatment and IV Solu-Medrol was provided due to increased work of breathing that appears to be more around her neck area.    At baseline, patient lives alone and able to take care of ADLs.  Sister lives close to her and checks on her regularly.   Review of Systems: This cannot be obtained at this time due to patient's current condition   Past Medical History:  Diagnosis Date  . Hyperlipidemia   . Hypertension   . Lower back pain Oct 2013  . Occlusive disease, arterial   . Peripheral vascular disease (HCC)   . Stress Oct. 2013   Dr. Dhruv B. Vyas    Past   Surgical History:  Procedure Laterality Date  . ABDOMINAL HYSTERECTOMY    . BACK SURGERY    . NOSE SURGERY    . TUMOR REMOVED FROM FINGER      Family History  Problem Relation Age of Onset  . Heart disease Mother   . Other Father        MACULARDEGENERATION   Social History   Tobacco Use  . Smoking status: Current Every Day Smoker    Packs/day: 1.00    Years: 30.00    Pack years: 30.00    Types: Cigarettes  . Smokeless tobacco: Never Used  Substance Use Topics  .  Alcohol use: No  . Drug use: No   No Known Allergies  Current Facility-Administered Medications:  .  [COMPLETED] sodium chloride 0.9 % bolus 500 mL, 500 mL, Intravenous, Once, Stopped at 12/31/19 2359 **FOLLOWED BY** 0.9 %  sodium chloride infusion, 100 mL/hr, Intravenous, Continuous, Linwood Dibbles, MD, Last Rate: 100 mL/hr at 12/31/19 2017, 100 mL/hr at 12/31/19 2017 .  0.9 %  sodium chloride infusion, , Intravenous, Once, Adefeso, Oladapo, DO .  albuterol (PROVENTIL) (2.5 MG/3ML) 0.083% nebulizer solution 2.5 mg, 2.5 mg, Nebulization, Q6H PRN, Adefeso, Oladapo, DO .  [START ON 01/02/2020] cefTRIAXone (ROCEPHIN) 1 g in sodium chloride 0.9 % 100 mL IVPB, 1 g, Intravenous, Q24H, Adefeso, Oladapo, DO .  cyanocobalamin ((VITAMIN B-12)) injection 1,000 mcg, 1,000 mcg, Intramuscular, Once, Adefeso, Oladapo, DO .  ipratropium (ATROVENT) nebulizer solution 0.5 mg, 0.5 mg, Nebulization, Q6H PRN, Adefeso, Oladapo, DO .  labetalol (NORMODYNE) injection 5 mg, 5 mg, Intravenous, Q6H PRN, Adefeso, Oladapo, DO .  morphine 2 MG/ML injection 2 mg, 2 mg, Intravenous, Q4H PRN, Adefeso, Oladapo, DO, 2 mg at 01/01/20 0128    Physical Exam(=30) BP (!) 159/92   Pulse 105   Temp 98.6 F (37 C) (Oral)   Resp 22   Ht 5\' 3"  (1.6 m)   Wt 48.8 kg   SpO2 93%   BMI 19.06 kg/m   Gen. Appearance frail Peripheral vascular system no edema color normal cap refill normal  Lymph nodes ARE NORMAL  Gait unable to walk   Left Upper extremity  Inspection revealed no malalignment or asymmetry  Assessment of range of motion: Full range of motion was recorded  Assessment of stability: Elbow wrist and hand and shoulder were stable  Assessment of muscle strength and tone revealed grade 5 muscle strength and normal muscle tone  Skin was normal without rash lesion or ulceration  Right upper extremity  Inspection revealed no malalignment or asymmetry  Assessment of range of motion: Full range of motion was recorded   Assessment of stability: Elbow wrist and hand and shoulder were stable  Assessment of muscle strength and tone revealed grade 5 muscle strength and normal muscle tone  Skin was normal without rash lesion or ulceration  Right Lower extremity  Inspection revealed mild asymmetry vs left   Assessment of range of motion: can not assess pain frx   Assessment of stability: knee ankle stable   Assessment of muscle strength and tone revealed normal muscle tone  Skin no erythema  Left lower extremity Inspection revealed no malalignment or asymmetry  Assessment of range of motion: Full range of motion was recorded  Assessment of stability: Ankle, knee and hip were stable  Assessment of muscle strength and tone revealed grade 5 muscle strength and normal muscle tone  Skin was normal without rash lesion or ulceration  Coordination could not assess Deep  tendon reflexes were 2+ in the upper extremities  Examination of sensation by touch : she responded to pain  Mental status  Oriented to time person and place abnormal   Mood and affect could not assess  Dx:   Data Reviewed  ER RECORD REVIEWED: CONFIRMS HISTORY   I reviewed the following images and the reports and my independent interpretation is nondisplaced impacted right femoral neck fracture no arthritis of any significance   Assessment  Right femoral neck fracture so-called Garden one nondisplaced impacted  Plan   Open treatment internal fixation with cannulated screws on Sunday   Vickki Hearing MD

## 2020-01-01 NOTE — Progress Notes (Signed)
PROGRESS NOTE    Denise Mendoza DOB: August 11, 1931 DOA: 12/31/2019 PCP: Ignatius Specking, MD   Brief Narrative:  Per HPI: Denise Mendoza is a 86 y.o. female with medical history significant for hypertension, hyperlipidemia and disc degenerative disease and lumbar spondylosis with near right L4-L5 radiculopathy status post laminectomy and discectomy (2012) who presents to the emergency department via EMS due to altered mental status.  Patient was unable to provide history possibly secondary to altered mental status.  History was obtained from ED physician and ED medical record.  Per report, patient was found lying on the ground in front of her sisters home, it was unknown if she had a fall, last well known time was uncertain.  Patient was not oriented to place when interviewed by ED physician (she thought she was at a  court house), there was complaint regarding right shoulder pain and difficulty in being able to lift right leg.  She was reported to deny chest pain, shortness of breath, fever, chills  -Patient seen by orthopedics with plans for ORIF of right femoral neck fracture in a.m. pending improvement in hypokalemia and hypoxemia.  Assessment & Plan:   Principal Problem:   Altered mental status Active Problems:   Fracture of femoral neck, right (HCC)   Hypokalemia   Dehydration   Respiratory failure with hypoxia (HCC)   Vitamin B12 deficiency   Thrombocytopenia (HCC)   Leukocytosis   Acute lower UTI   Essential hypertension   Hyperlipidemia   Altered mental status secondary to multifactorial-improved Patient was confused and agitated, she was unable to engage in an interview at bedside.  At baseline, patient lives alone and able to take care of ADLs. She was suspected to have UTI and started on antibiotics Urine drug screen was negative for any drug intoxication Ethanol level was < 10 Patient was placed on bilateral soft wrist restraints due to agitation and  confusion Continue fall precaution, seizure precaution, aspiration precaution and neurochecks  Acute respiratory failure with hypoxia Currently on 4L [place] oxygen, room air at home. ABG: 7.392/43.2/59.5/25.2 This may be due to patient being agitated/respiratory distress/pain (patient does not use oxygen at baseline) versus underlying COPD We will plan to schedule breathing treatments every 6 hours IV Solu-Medrol 20 mg IV twice daily scheduled as well as Pulmicort, discussed with anesthesiology -Prior history of tobacco abuse noted -Try incentive spirometry and wean oxygen as tolerated  Right femoral neck fracture -Discussed with Dr. Romeo Apple on 7/31 Right hip x-ray showed Impacted subcapital femoral neck fracture on the right Continue IV morphine 2 mg every 4 hours as needed Continue fall precaution and neurochecks Continue PT/OT eval and treat Orthopedic surgeon will be consulted for further evaluation and management. Plan to keep n.p.o. after midnight for possible procedure in a.m.  Hypokalemia-downtrending K+ 2.4 this a.m. despite IV and p.o. repletion -Does not appear to have diarrhea -Continue aggressive repletion IV and p.o. today and recheck in a.m.  Dehydration Continue IV hydration  Leukocytosis/thrombocytopenia possibly reactive WBC 11.7; platelets are 149 Continue to monitor CBC with morning labs  Questionable UTI POA Due to patient's altered mental status, agitation and inability to provide history at this time, she was suspected to have a UTI, though UA was not convincing (protein > 300, leukocytes small, bacteria, hemoglobin moderate). Patient was started on IV ceftriaxone, we shall empirically continue with same at this time with plan to de-escalate based on urine culture. Urine culture pending  Vitamin B12 deficiency Vitamin B12  94; vitamin B12 1000 mcg IM x1 will be given in the morning, with plan to transition to p.o. meds once patient is more alert and  able to tolerate oral intake  Essential hypertension (uncontrolled) Continue IV labetalol every 6 hours as needed for SBP > 170  Hyperlipidemia Continue home Zocor when patient is able to tolerate oral intake   DVT prophylaxis: SCDs Code Status: Full code Family Communication: Discussed with son and sister at bedside Disposition Plan:   Status is: Inpatient  Remains inpatient appropriate because:Persistent severe electrolyte disturbances, Ongoing diagnostic testing needed not appropriate for outpatient work up and IV treatments appropriate due to intensity of illness or inability to take PO   Dispo: The patient is from: Home              Anticipated d/c is to: SNF              Anticipated d/c date is: 2 days              Patient currently is not medically stable to d/c.  Consultants:   Orthopedics  Procedures:   Stable  Antimicrobials:  Anti-infectives (From admission, onward)   Start     Dose/Rate Route Frequency Ordered Stop   01/02/20 0000  cefTRIAXone (ROCEPHIN) 1 g in sodium chloride 0.9 % 100 mL IVPB     Discontinue     1 g 200 mL/hr over 30 Minutes Intravenous Every 24 hours 01/01/20 0422 01/04/20 2359   12/31/19 2330  cefTRIAXone (ROCEPHIN) 1 g in sodium chloride 0.9 % 100 mL IVPB        1 g 200 mL/hr over 30 Minutes Intravenous  Once 12/31/19 2323 01/01/20 0115       Subjective: Patient seen and evaluated today with no new acute complaints or concerns. No acute concerns or events noted overnight.  She denies any shortness of breath, cough, or chest tightness.  She denies any significant pain.  Objective: Vitals:   01/01/20 0300 01/01/20 0600 01/01/20 0745 01/01/20 1500  BP: (!) 154/67 (!) 159/92  (!) 129/61  Pulse: (!) 110 105  89  Resp: 20 22  20   Temp: 99.9 F (37.7 C) 98.6 F (37 C)  99 F (37.2 C)  TempSrc: Axillary Oral  Oral  SpO2: 94% 93% 93% 93%  Weight: 48.8 kg     Height: 5\' 3"  (1.6 m)       Intake/Output Summary (Last 24 hours) at  01/01/2020 1546 Last data filed at 01/01/2020 1327 Gross per 24 hour  Intake 2428.68 ml  Output 2 ml  Net 2426.68 ml   Filed Weights   12/31/19 1940 01/01/20 0300  Weight: 49 kg 48.8 kg    Examination:  General exam: Appears calm and comfortable  Respiratory system: Clear to auscultation. Respiratory effort normal.  No wheezes auscultated.  4 L nasal cannula oxygen supplementation ongoing. Cardiovascular system: S1 & S2 heard, RRR. Gastrointestinal system: Abdomen is soft Central nervous system: Alert and awake Extremities: No edema Skin: No rashes, lesions or ulcers Psychiatry: Flat affect    Data Reviewed: I have personally reviewed following labs and imaging studies  CBC: Recent Labs  Lab 12/31/19 1955 01/01/20 0837  WBC 11.7* 11.5*  NEUTROABS 9.6*  --   HGB 13.4 11.4*  HCT 41.5 38.0  MCV 94.7 98.4  PLT 149* 87*   Basic Metabolic Panel: Recent Labs  Lab 12/31/19 1955 12/31/19 2158 01/01/20 0837  NA 142  --  139  K 2.7*  --  2.4*  CL 104  --  104  CO2 26  --  22  GLUCOSE 123*  --  132*  BUN 17  --  9  CREATININE 0.58  --  0.43*  CALCIUM 8.8*  --  7.7*  MG  --  1.8  --   PHOS  --   --  2.9   GFR: Estimated Creatinine Clearance: 39.6 mL/min (A) (by C-G formula based on SCr of 0.43 mg/dL (L)). Liver Function Tests: Recent Labs  Lab 12/31/19 1955 01/01/20 0837  AST 23 18  ALT 17 16  ALKPHOS 58 45  BILITOT 1.0 1.2  PROT 6.8 5.5*  ALBUMIN 4.1 3.2*   No results for input(s): LIPASE, AMYLASE in the last 168 hours. No results for input(s): AMMONIA in the last 168 hours. Coagulation Profile: Recent Labs  Lab 12/31/19 1955 01/01/20 0837  INR 1.1 1.3*   Cardiac Enzymes: No results for input(s): CKTOTAL, CKMB, CKMBINDEX, TROPONINI in the last 168 hours. BNP (last 3 results) No results for input(s): PROBNP in the last 8760 hours. HbA1C: No results for input(s): HGBA1C in the last 72 hours. CBG: No results for input(s): GLUCAP in the last 168  hours. Lipid Profile: No results for input(s): CHOL, HDL, LDLCALC, TRIG, CHOLHDL, LDLDIRECT in the last 72 hours. Thyroid Function Tests: Recent Labs    12/31/19 2158  TSH 1.637   Anemia Panel: Recent Labs    12/31/19 1955 01/01/20 0837  VITAMINB12 94*  --   FOLATE  --  6.3   Sepsis Labs: No results for input(s): PROCALCITON, LATICACIDVEN in the last 168 hours.  Recent Results (from the past 240 hour(s))  SARS Coronavirus 2 by RT PCR (hospital order, performed in [facility] hospital lab) Nasopharyngeal Nasopharyngeal Swab     Status: None   Collection Time: 12/31/19  9:35 PM   Specimen: Nasopharyngeal Swab  Result Value Ref Range Status   SARS Coronavirus 2 NEGATIVE NEGATIVE Final    Comment: (NOTE) SARS-CoV-2 target nucleic acids are NOT DETECTED.  The SARS-CoV-2 RNA is generally detectable in upper and lower respiratory specimens during the acute phase of infection. The lowest concentration of SARS-CoV-2 viral copies this assay can detect is 250 copies / mL. A negative result does not preclude SARS-CoV-2 infection and should not be used as the sole basis for treatment or other patient management decisions.  A negative result may occur with improper specimen collection / handling, submission of specimen other than nasopharyngeal swab, presence of viral mutation(s) within the areas targeted by this assay, and inadequate number of viral copies (<250 copies / mL). A negative result must be combined with clinical observations, patient history, and epidemiological information.  Fact Sheet for Patients:   BoilerBrush.com.cy  Fact Sheet for Healthcare Providers: https://pope.com/  This test is not yet approved or  cleared by the [place] FDA and has been authorized for detection and/or diagnosis of SARS-CoV-2 by FDA under an Emergency Use Authorization (EUA).  This EUA will remain in effect (meaning this test can be  used) for the duration of the COVID-19 declaration under Section 564(b)(1) of the Act, 21 U.S.C. section 360bbb-3(b)(1), unless the authorization is terminated or revoked sooner.  Performed at [facility], [address], [place], [place] [address]          Radiology Studies: DG Shoulder Right  Result Date: 12/31/2019 CLINICAL DATA:  Fall EXAM: RIGHT SHOULDER - 2+ VIEW COMPARISON:  None. FINDINGS: The patient is status post ORIF with plate screw  fixation of the proximal humerus with a healed fracture deformity. No periprosthetic lucency or acute fracture is identified. There is diffuse osteopenia. Mild cardiomegaly and aortic knob calcifications are noted. IMPRESSION: Status post ORIF of the proximal humerus without complication. Electronically Signed   By: Jonna Clark M.D.   On: 12/31/2019 20:46   CT HEAD WO CONTRAST  Result Date: 12/31/2019 CLINICAL DATA:  Change in mental status EXAM: CT HEAD WITHOUT CONTRAST TECHNIQUE: Contiguous axial images were obtained from the base of the skull through the vertex without intravenous contrast. COMPARISON:  July 20, 2019 FINDINGS: Brain: No evidence of acute territorial infarction, hemorrhage, hydrocephalus,extra-axial collection or mass lesion/mass effect. There is dilatation the ventricles and sulci consistent with age-related atrophy. Low-attenuation changes in the deep white matter consistent with small vessel ischemia. Vascular: No hyperdense vessel or unexpected calcification. Skull: The skull is intact. No fracture or focal lesion identified. Sinuses/Orbits: The visualized paranasal sinuses and mastoid air cells are clear. The orbits and globes intact. Other: None IMPRESSION: No acute intracranial abnormality. Findings consistent with age related atrophy and chronic small vessel ischemia Electronically Signed   By: Jonna Clark M.D.   On: 12/31/2019 21:00   DG Chest Portable 1 View  Result Date: 12/31/2019 CLINICAL DATA:  Fall EXAM:  PORTABLE CHEST 1 VIEW COMPARISON:  July 20, 2019 FINDINGS: There is mild cardiomegaly. Aortic knob calcifications are seen. Both lungs are clear. The visualized skeletal structures are unremarkable. IMPRESSION: No active disease. Electronically Signed   By: Jonna Clark M.D.   On: 12/31/2019 20:56   DG Hip Unilat W or Wo Pelvis 2-3 Views Right  Result Date: 12/31/2019 CLINICAL DATA:  Found lying in yard, hip pain, initial encounter EXAM: DG HIP (WITH OR WITHOUT PELVIS) 3V RIGHT COMPARISON:  None. FINDINGS: Pelvic ring is intact. Impacted subcapital femoral neck fracture on the right is noted. No dislocation is seen. No soft tissue abnormality is noted. IMPRESSION: Impacted subcapital femoral neck fracture on the right. Electronically Signed   By: Alcide Clever M.D.   On: 12/31/2019 23:47        Scheduled Meds: . albuterol  2.5 mg Nebulization Q6H  . budesonide (PULMICORT) nebulizer solution  0.25 mg Nebulization BID  . ipratropium  0.5 mg Nebulization Q6H  . methylPREDNISolone (SOLU-MEDROL) injection  20 mg Intravenous Q12H  . potassium chloride  40 mEq Oral TID   Continuous Infusions: . sodium chloride 100 mL/hr (01/01/20 1457)  . sodium chloride    . [START ON 01/02/2020] cefTRIAXone (ROCEPHIN)  IV    . potassium chloride 10 mEq (01/01/20 1453)     LOS: 1 day    Time spent: 35 minutes    Salvator Seppala D Sherryll Burger, DO [facility]  If 7PM-7AM, please contact night-coverage [email or web address] 01/01/2020, 3:46 PM

## 2020-01-01 NOTE — Consult Note (Signed)
HOSPITAL CONSULT  [facility]   Patient ID: Denise Mendoza, female   DOB: [birth year], 86 y.o.   MRN: [id number]  New patient  Requested by: Dr Maurilio Lovely  Reason for: farcture right hip   Based on the information below I recommend internal fixation right hip  Chief Complaint  Patient presents with  . Altered Mental Status  . Fall     HPI   She is still unable to give a history   Chief Complaint: Altered mental status   HPI: Denise Mendoza is a 86 y.o. female with medical history significant for hypertension, hyperlipidemia and disc degenerative disease and lumbar spondylosis with near right L4-L5 radiculopathy status post laminectomy and discectomy (2012) who presents to the emergency department via EMS due to altered mental status.  Patient was unable to provide history possibly secondary to altered mental status.  History was obtained from ED physician and ED medical record.  Per report, patient was found lying on the ground in front of her sisters home, it was unknown if she had a fall, last well known time was uncertain.  Patient was not oriented to place when interviewed by ED physician (she thought she was at a  court house), there was complaint regarding right shoulder pain and difficulty in being able to lift right leg.  She was reported to deny chest pain, shortness of breath, fever, chills   ED Course: In the emergency department, she was tachycardic and was at 1 time tachypneic.  Work-up in the ED showed mild leukocytosis, thrombocytopenia, hypokalemia, hypocalcemia, hyperglycemia.  Urinalysis showed proteinuria, moderate urine hemoglobin, small leukocytes and rare bacteria.  Vitamin B12 94.  ABG showed hypoxia with PO2 of 59.5.  Alcohol level was <10, TSH 1.637.  SARS coronavirus 2 was negative.  CT of head without contrast showed no acute intracranial normality.  Chest x-ray showed no active disease.  Right shoulder x-ray showed status post ORIF of the  proximal marrows without complication.  Right hip x-ray showed impacted subcapital femoral neck fracture on the right.  She was empirically treated with IV ceftriaxone due to presumed UTI in the setting of altered mental status.  IV Ativan 0.5 Mg x2 was given.  Potassium was replenished and IV NS 1 L of NS was given.  Hospitalist was asked to admit.  For further evaluation and management.   At bedside, patient's confusion appeared to have worsened despite 2 doses of IV Ativan, she was already placed on bilateral soft wrist restraints, she appeared to be in distress due to increased work of breathing, supplemental oxygen at 4 LPM was provided.  Patient was laying in bed in a right lateral position with HOB raised to 30 degrees, it was then recommended to change patient's positioning considering impacted subcapital femoral neck fracture on the right.  Breathing treatment and IV Solu-Medrol was provided due to increased work of breathing that appears to be more around her neck area.    At baseline, patient lives alone and able to take care of ADLs.  Sister lives close to her and checks on her regularly.   Review of Systems: This cannot be obtained at this time due to patient's current condition   Past Medical History:  Diagnosis Date  . Hyperlipidemia   . Hypertension   . Lower back pain Oct 2013  . Occlusive disease, arterial   . Peripheral vascular disease (HCC)   . Stress Oct. 2013   Dr. Ignatius Specking    Past  Surgical History:  Procedure Laterality Date  . ABDOMINAL HYSTERECTOMY    . BACK SURGERY    . NOSE SURGERY    . TUMOR REMOVED FROM FINGER      Family History  Problem Relation Age of Onset  . Heart disease Mother   . Other Father        MACULARDEGENERATION   Social History   Tobacco Use  . Smoking status: Current Every Day Smoker    Packs/day: 1.00    Years: 30.00    Pack years: 30.00    Types: Cigarettes  . Smokeless tobacco: Never Used  Substance Use Topics  .  Alcohol use: No  . Drug use: No   No Known Allergies  Current Facility-Administered Medications:  .  [COMPLETED] sodium chloride 0.9 % bolus 500 mL, 500 mL, Intravenous, Once, Stopped at 12/31/19 2359 **FOLLOWED BY** 0.9 %  sodium chloride infusion, 100 mL/hr, Intravenous, Continuous, Linwood Dibbles, MD, Last Rate: 100 mL/hr at 12/31/19 2017, 100 mL/hr at 12/31/19 2017 .  0.9 %  sodium chloride infusion, , Intravenous, Once, Adefeso, Oladapo, DO .  albuterol (PROVENTIL) (2.5 MG/3ML) 0.083% nebulizer solution 2.5 mg, 2.5 mg, Nebulization, Q6H PRN, Adefeso, Oladapo, DO .  [START ON 01/02/2020] cefTRIAXone (ROCEPHIN) 1 g in sodium chloride 0.9 % 100 mL IVPB, 1 g, Intravenous, Q24H, Adefeso, Oladapo, DO .  cyanocobalamin ((VITAMIN B-12)) injection 1,000 mcg, 1,000 mcg, Intramuscular, Once, Adefeso, Oladapo, DO .  ipratropium (ATROVENT) nebulizer solution 0.5 mg, 0.5 mg, Nebulization, Q6H PRN, Adefeso, Oladapo, DO .  labetalol (NORMODYNE) injection 5 mg, 5 mg, Intravenous, Q6H PRN, Adefeso, Oladapo, DO .  morphine 2 MG/ML injection 2 mg, 2 mg, Intravenous, Q4H PRN, Adefeso, Oladapo, DO, 2 mg at 01/01/20 0128    Physical Exam(=30) BP (!) 159/92   Pulse 105   Temp 98.6 F (37 C) (Oral)   Resp 22   Ht 5\' 3"  (1.6 m)   Wt 48.8 kg   SpO2 93%   BMI 19.06 kg/m   Gen. Appearance frail Peripheral vascular system no edema color normal cap refill normal  Lymph nodes ARE NORMAL  Gait unable to walk   Left Upper extremity  Inspection revealed no malalignment or asymmetry  Assessment of range of motion: Full range of motion was recorded  Assessment of stability: Elbow wrist and hand and shoulder were stable  Assessment of muscle strength and tone revealed grade 5 muscle strength and normal muscle tone  Skin was normal without rash lesion or ulceration  Right upper extremity  Inspection revealed no malalignment or asymmetry  Assessment of range of motion: Full range of motion was recorded   Assessment of stability: Elbow wrist and hand and shoulder were stable  Assessment of muscle strength and tone revealed grade 5 muscle strength and normal muscle tone  Skin was normal without rash lesion or ulceration  Right Lower extremity  Inspection revealed mild asymmetry vs left   Assessment of range of motion: can not assess pain frx   Assessment of stability: knee ankle stable   Assessment of muscle strength and tone revealed normal muscle tone  Skin no erythema  Left lower extremity Inspection revealed no malalignment or asymmetry  Assessment of range of motion: Full range of motion was recorded  Assessment of stability: Ankle, knee and hip were stable  Assessment of muscle strength and tone revealed grade 5 muscle strength and normal muscle tone  Skin was normal without rash lesion or ulceration  Coordination could not assess Deep  tendon reflexes were 2+ in the upper extremities  Examination of sensation by touch : she responded to pain  Mental status  Oriented to time person and place abnormal   Mood and affect could not assess  Dx:   Data Reviewed  ER RECORD REVIEWED: CONFIRMS HISTORY   I reviewed the following images and the reports and my independent interpretation is nondisplaced impacted right femoral neck fracture no arthritis of any significance   Assessment  Right femoral neck fracture so-called Garden one nondisplaced impacted  Plan   Open treatment internal fixation with cannulated screws on Sunday   Vickki Hearing MD

## 2020-01-01 NOTE — Progress Notes (Signed)
Patient arrived to floor with restraints.  Patient resting at this time. Restraints not applied at this time.

## 2020-01-02 LAB — BASIC METABOLIC PANEL
Anion gap: 9 (ref 5–15)
BUN: 11 mg/dL (ref 8–23)
CO2: 23 mmol/L (ref 22–32)
Calcium: 8.3 mg/dL — ABNORMAL LOW (ref 8.9–10.3)
Chloride: 108 mmol/L (ref 98–111)
Creatinine, Ser: 0.45 mg/dL (ref 0.44–1.00)
GFR calc Af Amer: >60 mL/min (ref >60)
GFR calc non Af Amer: >60 mL/min (ref >60)
Glucose, Bld: 142 mg/dL — ABNORMAL HIGH (ref 70–99)
Potassium: 4.2 mmol/L (ref 3.5–5.1)
Sodium: 140 mmol/L (ref 135–145)

## 2020-01-02 LAB — URINE CULTURE

## 2020-01-02 LAB — CBC
HCT: 38.6 % (ref 36.0–46.0)
Hemoglobin: 11.7 g/dL — ABNORMAL LOW (ref 12.0–15.0)
MCH: 29.3 pg (ref 26.0–34.0)
MCHC: 30.3 g/dL (ref 30.0–36.0)
MCV: 96.7 fL (ref 80.0–100.0)
Platelets: 84 10*3/uL — ABNORMAL LOW (ref 150–400)
RBC: 3.99 MIL/uL (ref 3.87–5.11)
RDW: 16.3 % — ABNORMAL HIGH (ref 11.5–15.5)
WBC: 12.4 10*3/uL — ABNORMAL HIGH (ref 4.0–10.5)
nRBC: 0 % (ref 0.0–0.2)

## 2020-01-02 LAB — MAGNESIUM: Magnesium: 1.7 mg/dL (ref 1.7–2.4)

## 2020-01-02 MED ORDER — METOPROLOL TARTRATE 5 MG/5ML IV SOLN
5.0000 mg | Freq: Four times a day (QID) | INTRAVENOUS | Status: DC
  Administered 2020-01-02 (×3): 5 mg via INTRAVENOUS
  Filled 2020-01-02 (×3): qty 5

## 2020-01-02 MED ORDER — ALPRAZOLAM 1 MG PO TABS: 1.0000 mg | ORAL_TABLET | Freq: Every evening | ORAL | Status: DC | PRN

## 2020-01-02 MED ORDER — GABAPENTIN 300 MG PO CAPS: 300.0000 mg | ORAL_CAPSULE | Freq: Three times a day (TID) | ORAL | Status: DC

## 2020-01-02 MED ORDER — SIMVASTATIN 20 MG PO TABS
40.0000 mg | ORAL_TABLET | Freq: Every evening | ORAL | Status: DC
  Administered 2020-01-03 – 2020-01-05 (×3): 40 mg via ORAL
  Filled 2020-01-02 (×3): qty 2

## 2020-01-02 MED ORDER — METOPROLOL TARTRATE 25 MG PO TABS
25.0000 mg | ORAL_TABLET | Freq: Two times a day (BID) | ORAL | Status: DC
  Administered 2020-01-02 – 2020-01-06 (×7): 25 mg via ORAL
  Filled 2020-01-02 (×9): qty 1

## 2020-01-02 MED ORDER — LEVALBUTEROL HCL 0.63 MG/3ML IN NEBU: 0.6300 mg | INHALATION_SOLUTION | Freq: Four times a day (QID) | RESPIRATORY_TRACT | Status: DC | PRN

## 2020-01-02 MED ORDER — METOPROLOL TARTRATE 5 MG/5ML IV SOLN
5.0000 mg | Freq: Four times a day (QID) | INTRAVENOUS | Status: DC | PRN
  Filled 2020-01-02 (×2): qty 5

## 2020-01-02 NOTE — Progress Notes (Signed)
   01/02/20 0000  Vitals  Temp 98.7 F (37.1 C)  BP (!) 142/74  MAP (mmHg) 91  BP Location Left Arm  BP Method Automatic  Patient Position (if appropriate) Lying  Pulse Rate (!) 126  Pulse Rate Source Monitor  Resp 20  MEWS COLOR  MEWS Score Color Yellow  Oxygen Therapy  SpO2 90 %  O2 Device Nasal Cannula  O2 Flow Rate (L/min) 3 L/min  MEWS Score  MEWS Temp 0  MEWS Systolic 0  MEWS Pulse 2  MEWS RR 0  MEWS LOC 0  MEWS Score 2  Mid level notified of increased pulse with new order given. Pt alert and verbal with confusion. No s/s of distress noted. Call bell in reach.

## 2020-01-02 NOTE — Progress Notes (Signed)
PROGRESS NOTE    Denise Mendoza  TDD:[id number] DOB: [birth year] DOA: 12/31/2019 PCP: Ignatius Specking, MD   Brief Narrative:  Per HPI: Erich Fruge Calhounis a 86 y.o.femalewith medical history significant forhypertension, hyperlipidemia and disc degenerative disease and lumbar spondylosis with near right L4-L5 radiculopathy status post laminectomy and discectomy (2012) who presents to the emergency department via EMS due to altered mental status. Patient was unable to provide history possibly secondary to altered mental status. History was obtained from ED physician and ED medical record. Per report, patient was found lying on the ground in front of her sisters home, it was unknown if she had a fall, last well known time was uncertain.Patient was not oriented to place when interviewed by ED physician (she thought she was at a court house), there was complaint regarding right shoulder pain and difficulty in being able to lift right leg. She was reported to deny chest pain, shortness of breath, fever, chills  -Patient seen by orthopedics with plans for ORIF of right femoral neck fracture in a.m. hypoxemia and hypokalemia have resolved.  Patient is overall doing much better.  Urine culture with multiple species growth.  Assessment & Plan:   Principal Problem:   Altered mental status Active Problems:   Fracture of femoral neck, right (HCC)   Hypokalemia   Dehydration   Respiratory failure with hypoxia (HCC)   Vitamin B12 deficiency   Thrombocytopenia (HCC)   Leukocytosis   Acute lower UTI   Essential hypertension   Hyperlipidemia   Altered mental status secondary to multifactorial-resolved Patient was confused and agitated, she was unable to engage in an interview at bedside. At baseline, patient lives alone and able to take care of ADLs. She was suspected to have UTI and started on antibiotics Urine drug screen was negative for any drug intoxication Ethanol level was<  10 Patient was placed on bilateral soft wrist restraints due to agitation and confusion Continue fall precaution, seizure precaution, aspiration precaution and neurochecks  Acute respiratory failure with hypoxia-resolved Currently on room air ABG: 7.392/43.2/59.5/25.2 This may be due to patient beingagitated/respiratory distress/pain (patient does not use oxygen at baseline) versus underlying COPD Breathing treatments changed to Xopenex as needed Discontinue further Solu-Medrol and Pulmicort -Try incentive spirometry and wean oxygen as tolerated  Right femoral neck fracture -Discussed with Dr. Romeo Apple on 7/31, plan for ORIF 8/2 Right hip x-ray showedImpacted subcapital femoral neck fracture on the right Continue IV morphine 2 mg every 4 hours as needed Continue fall precaution and neurochecks Continue PT/OT eval and treat Orthopedic surgeon will be consulted for further evaluation and management. Plan to keep n.p.o. after midnight for possible procedure in a.m.  Hypokalemia-resolved Recheck in a.m.  Dehydration Now euvolemic, discontinue IV hydration  Leukocytosis/thrombocytopenia possibly reactive WBC 11.7;platelets are 149 Continue to monitor CBC with morning labs  UTI Due to patient's altered mental status, agitation and inability to provide history at this time, she was suspected to have a UTI, though UA was not convincing (protein>300, leukocytes small, bacteria, hemoglobin moderate). Patient was started on IV ceftriaxone, we shall empirically continue with same at this time with plan to de-escalate based on urine culture. Urine culture with multiple species growth noted  Vitamin B12 deficiency Vitamin B12 94;vitamin B12 1000 mcg IM x1 will be given in the morning, with plan to transition to p.o. meds once patient is more alert and able to tolerate oral intake  Essential hypertension (uncontrolled) Continue IV labetalol every 6 hours as needed for SBP>  170 -Resume home metoprolol today  Hyperlipidemia Resume Zocor   DVT prophylaxis: SCDs Code Status: Full code Family Communication: Discussed with son at bedside 8/1 Disposition Plan:   Status is: Inpatient  Remains inpatient appropriate because:Persistent severe electrolyte disturbances, Ongoing diagnostic testing needed not appropriate for outpatient work up and IV treatments appropriate due to intensity of illness or inability to take PO   Dispo: The patient is from: Home  Anticipated d/c is to: SNF  Anticipated d/c date is: 2 days  Patient currently is not medically stable to d/c.  Consultants:   Orthopedics  Procedures:   Stable  Antimicrobials:  Anti-infectives (From admission, onward)   Start     Dose/Rate Route Frequency Ordered Stop   01/02/20 0000  cefTRIAXone (ROCEPHIN) 1 g in sodium chloride 0.9 % 100 mL IVPB     Discontinue     1 g 200 mL/hr over 30 Minutes Intravenous Every 24 hours 01/01/20 0422 01/04/20 2359   12/31/19 2330  cefTRIAXone (ROCEPHIN) 1 g in sodium chloride 0.9 % 100 mL IVPB        1 g 200 mL/hr over 30 Minutes Intravenous  Once 12/31/19 2323 01/01/20 0115       Subjective: Patient seen and evaluated today with no new acute complaints or concerns. No acute concerns or events noted overnight.  She is feeling much better this morning.  Objective: Vitals:   01/02/20 0047 01/02/20 0120 01/02/20 0542 01/02/20 0750  BP: (!) 158/75 (!) 144/72 (!) 169/90   Pulse: (!) 121 97 (!) 108   Resp: 20 20 18    Temp: 99.3 F (37.4 C) 98.3 F (36.8 C) 99 F (37.2 C)   TempSrc:  Oral Oral   SpO2: 95% 94% 95% 96%  Weight:      Height:        Intake/Output Summary (Last 24 hours) at 01/02/2020 1403 Last data filed at 01/02/2020 0934 Gross per 24 hour  Intake 1791.42 ml  Output --  Net 1791.42 ml   Filed Weights   12/31/19 1940 01/01/20 0300  Weight: 49 kg 48.8 kg    Examination:  General exam:  Appears calm and comfortable  Respiratory system: Clear to auscultation. Respiratory effort normal. Cardiovascular system: S1 & S2 heard, RRR. Gastrointestinal system: Abdomen is nondistended, soft and nontender. No organomegaly or masses felt. Normal bowel sounds heard. Central nervous system: Alert and awake Extremities: No edema Skin: No rashes, lesions or ulcers Psychiatry: Judgement and insight appear normal. Mood & affect appropriate.     Data Reviewed: I have personally reviewed following labs and imaging studies  CBC: Recent Labs  Lab 12/31/19 1955 01/01/20 0837 01/02/20 0739  WBC 11.7* 11.5* 12.4*  NEUTROABS 9.6*  --   --   HGB 13.4 11.4* 11.7*  HCT 41.5 38.0 38.6  MCV 94.7 98.4 96.7  PLT 149* 87* 84*   Basic Metabolic Panel: Recent Labs  Lab 12/31/19 1955 12/31/19 2158 01/01/20 0837 01/02/20 0739  NA 142  --  139 140  K 2.7*  --  2.4* 4.2  CL 104  --  104 108  CO2 26  --  22 23  GLUCOSE 123*  --  132* 142*  BUN 17  --  9 11  CREATININE 0.58  --  0.43* 0.45  CALCIUM 8.8*  --  7.7* 8.3*  MG  --  1.8  --  1.7  PHOS  --   --  2.9  --    GFR: Estimated Creatinine Clearance:  38.9 mL/min (by C-G formula based on SCr of 0.45 mg/dL). Liver Function Tests: Recent Labs  Lab 12/31/19 1955 01/01/20 0837  AST 23 18  ALT 17 16  ALKPHOS 58 45  BILITOT 1.0 1.2  PROT 6.8 5.5*  ALBUMIN 4.1 3.2*   No results for input(s): LIPASE, AMYLASE in the last 168 hours. No results for input(s): AMMONIA in the last 168 hours. Coagulation Profile: Recent Labs  Lab 12/31/19 1955 01/01/20 0837  INR 1.1 1.3*   Cardiac Enzymes: No results for input(s): CKTOTAL, CKMB, CKMBINDEX, TROPONINI in the last 168 hours. BNP (last 3 results) No results for input(s): PROBNP in the last 8760 hours. HbA1C: No results for input(s): HGBA1C in the last 72 hours. CBG: No results for input(s): GLUCAP in the last 168 hours. Lipid Profile: No results for input(s): CHOL, HDL, LDLCALC,  TRIG, CHOLHDL, LDLDIRECT in the last 72 hours. Thyroid Function Tests: Recent Labs    12/31/19 2158  TSH 1.637   Anemia Panel: Recent Labs    12/31/19 1955 01/01/20 0837  VITAMINB12 94*  --   FOLATE  --  6.3   Sepsis Labs: No results for input(s): PROCALCITON, LATICACIDVEN in the last 168 hours.  Recent Results (from the past 240 hour(s))  SARS Coronavirus 2 by RT PCR (hospital order, performed in [facility] hospital lab) Nasopharyngeal Nasopharyngeal Swab     Status: None   Collection Time: 12/31/19  9:35 PM   Specimen: Nasopharyngeal Swab  Result Value Ref Range Status   SARS Coronavirus 2 NEGATIVE NEGATIVE Final    Comment: (NOTE) SARS-CoV-2 target nucleic acids are NOT DETECTED.  The SARS-CoV-2 RNA is generally detectable in upper and lower respiratory specimens during the acute phase of infection. The lowest concentration of SARS-CoV-2 viral copies this assay can detect is 250 copies / mL. A negative result does not preclude SARS-CoV-2 infection and should not be used as the sole basis for treatment or other patient management decisions.  A negative result may occur with improper specimen collection / handling, submission of specimen other than nasopharyngeal swab, presence of viral mutation(s) within the areas targeted by this assay, and inadequate number of viral copies (<250 copies / mL). A negative result must be combined with clinical observations, patient history, and epidemiological information.  Fact Sheet for Patients:   BoilerBrush.com.cy  Fact Sheet for Healthcare Providers: https://pope.com/  This test is not yet approved or  cleared by the [place] FDA and has been authorized for detection and/or diagnosis of SARS-CoV-2 by FDA under an Emergency Use Authorization (EUA).  This EUA will remain in effect (meaning this test can be used) for the duration of the COVID-19 declaration under Section  564(b)(1) of the Act, 21 U.S.C. section 360bbb-3(b)(1), unless the authorization is terminated or revoked sooner.  Performed at [facility], [address], [place], [place] [address]   Urine Culture     Status: Abnormal   Collection Time: 12/31/19 11:30 PM   Specimen: Urine, Clean Catch  Result Value Ref Range Status   Specimen Description   Final    URINE, CLEAN CATCH Performed at [facility], [address], [place], [place] [address]    Special Requests   Final    NONE Performed at [facility], [address], [place], [place] [address]    Culture MULTIPLE SPECIES PRESENT, SUGGEST RECOLLECTION (A)  Final   Report Status 01/02/2020 FINAL  Final         Radiology Studies: DG Shoulder Right  Result Date: 12/31/2019 CLINICAL DATA:  Fall EXAM: RIGHT SHOULDER - 2+ VIEW COMPARISON:  None. FINDINGS: The patient is status post ORIF with plate screw fixation of the proximal humerus with a healed fracture deformity. No periprosthetic lucency or acute fracture is identified. There is diffuse osteopenia. Mild cardiomegaly and aortic knob calcifications are noted. IMPRESSION: Status post ORIF of the proximal humerus without complication. Electronically Signed   By: Jonna Clark M.D.   On: 12/31/2019 20:46   CT HEAD WO CONTRAST  Result Date: 12/31/2019 CLINICAL DATA:  Change in mental status EXAM: CT HEAD WITHOUT CONTRAST TECHNIQUE: Contiguous axial images were obtained from the base of the skull through the vertex without intravenous contrast. COMPARISON:  July 20, 2019 FINDINGS: Brain: No evidence of acute territorial infarction, hemorrhage, hydrocephalus,extra-axial collection or mass lesion/mass effect. There is dilatation the ventricles and sulci consistent with age-related atrophy. Low-attenuation changes in the deep white matter consistent with small vessel ischemia. Vascular: No hyperdense vessel or unexpected calcification. Skull: The skull is intact. No fracture or focal lesion  identified. Sinuses/Orbits: The visualized paranasal sinuses and mastoid air cells are clear. The orbits and globes intact. Other: None IMPRESSION: No acute intracranial abnormality. Findings consistent with age related atrophy and chronic small vessel ischemia Electronically Signed   By: Jonna Clark M.D.   On: 12/31/2019 21:00   DG Chest Portable 1 View  Result Date: 12/31/2019 CLINICAL DATA:  Fall EXAM: PORTABLE CHEST 1 VIEW COMPARISON:  July 20, 2019 FINDINGS: There is mild cardiomegaly. Aortic knob calcifications are seen. Both lungs are clear. The visualized skeletal structures are unremarkable. IMPRESSION: No active disease. Electronically Signed   By: Jonna Clark M.D.   On: 12/31/2019 20:56   DG Hip Unilat W or Wo Pelvis 2-3 Views Right  Result Date: 12/31/2019 CLINICAL DATA:  Found lying in yard, hip pain, initial encounter EXAM: DG HIP (WITH OR WITHOUT PELVIS) 3V RIGHT COMPARISON:  None. FINDINGS: Pelvic ring is intact. Impacted subcapital femoral neck fracture on the right is noted. No dislocation is seen. No soft tissue abnormality is noted. IMPRESSION: Impacted subcapital femoral neck fracture on the right. Electronically Signed   By: Alcide Clever M.D.   On: 12/31/2019 23:47        Scheduled Meds: . metoprolol tartrate  5 mg Intravenous Q6H   Continuous Infusions: . cefTRIAXone (ROCEPHIN)  IV 1 g (01/01/20 2318)     LOS: 2 days    Time spent: 30 minutes    Zavien Clubb Hoover Brunette, DO [facility]  If 7PM-7AM, please contact night-coverage [email or web address] 01/02/2020, 2:03 PM

## 2020-01-02 NOTE — Progress Notes (Signed)
Patient ID: Denise Mendoza, female   DOB: 11/21/1931, 86 y.o.   MRN: [id number] BP (!) 169/90 (BP Location: Left Arm)   Pulse (!) 108   Temp 99 F (37.2 C) (Oral)   Resp 18   Ht 5\' 3"  (1.6 m)   Wt 48.8 kg   SpO2 96%   BMI 19.06 kg/m   HD # 2  12/31/2019   Right frm nck frx   hypok  AMS  CBC Latest Ref Rng & Units 01/02/2020 01/01/2020 12/31/2019  WBC 4.0 - 10.5 K/uL 12.4(H) 11.5(H) 11.7(H)  Hemoglobin 12.0 - 15.0 g/dL 11.7(L) 11.4(L) 13.4  Hematocrit 36 - 46 % 38.6 38.0 41.5  Platelets 150 - 400 K/uL 84(L) 87(L) 149(L)    BMP Latest Ref Rng & Units 01/02/2020 01/01/2020 12/31/2019  Glucose 70 - 99 mg/dL 01/02/2020) 128(N) 867(E)  BUN 8 - 23 mg/dL 11 9 17   Creatinine 0.44 - 1.00 mg/dL 720(N ) 4.70  Sodium 135 - 145 mmol/L 140 139 142  Potassium 3.5 - 5.1 mmol/L 4.2 2.4(LL) 2.7(LL)  Chloride 98 - 111 mmol/L 108 104 104  CO2 22 - 32 mmol/L 23 22 26   Calcium 8.9 - 10.3 mg/dL 8.3(L) 7.7(L) 8.8(L)   Looks good today   OTIF Monday

## 2020-01-03 ENCOUNTER — Encounter (HOSPITAL_COMMUNITY)
Admission: EM | Disposition: A | Discharge status: Home Health | Payer: Self-pay | Source: Home / Self Care | Attending: Internal Medicine

## 2020-01-03 ENCOUNTER — Inpatient Hospital Stay (HOSPITAL_COMMUNITY): Payer: Medicare Other | Admitting: Anesthesiology

## 2020-01-03 ENCOUNTER — Encounter (HOSPITAL_COMMUNITY): Payer: Self-pay | Admitting: Internal Medicine

## 2020-01-03 ENCOUNTER — Inpatient Hospital Stay (HOSPITAL_COMMUNITY): Payer: Medicare Other

## 2020-01-03 DIAGNOSIS — I739 Peripheral vascular disease, unspecified: Secondary | ICD-10-CM | POA: Diagnosis not present

## 2020-01-03 DIAGNOSIS — S72001A Fracture of unspecified part of neck of right femur, initial encounter for closed fracture: Secondary | ICD-10-CM | POA: Diagnosis not present

## 2020-01-03 DIAGNOSIS — I10 Essential (primary) hypertension: Secondary | ICD-10-CM | POA: Diagnosis not present

## 2020-01-03 DIAGNOSIS — F17210 Nicotine dependence, cigarettes, uncomplicated: Secondary | ICD-10-CM | POA: Diagnosis not present

## 2020-01-03 HISTORY — PX: HIP PINNING,CANNULATED: SHX1758

## 2020-01-03 LAB — BASIC METABOLIC PANEL
Anion gap: 8 (ref 5–15)
BUN: 16 mg/dL (ref 8–23)
CO2: 28 mmol/L (ref 22–32)
Calcium: 8.5 mg/dL — ABNORMAL LOW (ref 8.9–10.3)
Chloride: 106 mmol/L (ref 98–111)
Creatinine, Ser: 0.53 mg/dL (ref 0.44–1.00)
GFR calc Af Amer: >60 mL/min (ref >60)
GFR calc non Af Amer: >60 mL/min (ref >60)
Glucose, Bld: 96 mg/dL (ref 70–99)
Potassium: 4 mmol/L (ref 3.5–5.1)
Sodium: 142 mmol/L (ref 135–145)

## 2020-01-03 LAB — MAGNESIUM: Magnesium: 1.8 mg/dL (ref 1.7–2.4)

## 2020-01-03 LAB — CBC
HCT: 40.1 % (ref 36.0–46.0)
Hemoglobin: 12.1 g/dL (ref 12.0–15.0)
MCH: 29.4 pg (ref 26.0–34.0)
MCHC: 30.2 g/dL (ref 30.0–36.0)
MCV: 97.6 fL (ref 80.0–100.0)
Platelets: 87 10*3/uL — ABNORMAL LOW (ref 150–400)
RBC: 4.11 MIL/uL (ref 3.87–5.11)
RDW: 16.3 % — ABNORMAL HIGH (ref 11.5–15.5)
WBC: 11.6 10*3/uL — ABNORMAL HIGH (ref 4.0–10.5)
nRBC: 0 % (ref 0.0–0.2)

## 2020-01-03 LAB — SURGICAL PCR SCREEN
MRSA, PCR: NEGATIVE
Staphylococcus aureus: NEGATIVE

## 2020-01-03 SURGERY — FIXATION, FEMUR, NECK, PERCUTANEOUS, USING SCREW
Anesthesia: General | Site: Hip | Laterality: Right

## 2020-01-03 MED ORDER — ASPIRIN EC 325 MG PO TBEC
325.0000 mg | DELAYED_RELEASE_TABLET | Freq: Every day | ORAL | Status: DC
  Administered 2020-01-04 – 2020-01-06 (×3): 325 mg via ORAL
  Filled 2020-01-03 (×5): qty 1

## 2020-01-03 MED ORDER — ONDANSETRON HCL 4 MG PO TABS
4.0000 mg | ORAL_TABLET | Freq: Four times a day (QID) | ORAL | Status: DC | PRN
  Filled 2020-01-03: qty 1

## 2020-01-03 MED ORDER — CEFAZOLIN SODIUM-DEXTROSE 2-4 GM/100ML-% IV SOLN
2.0000 g | Freq: Four times a day (QID) | INTRAVENOUS | Status: AC
  Administered 2020-01-03 – 2020-01-04 (×2): 2 g via INTRAVENOUS
  Filled 2020-01-03 (×2): qty 100

## 2020-01-03 MED ORDER — ONDANSETRON HCL 4 MG/2ML IJ SOLN: 4.0000 mg | Freq: Four times a day (QID) | INTRAMUSCULAR | Status: DC | PRN

## 2020-01-03 MED ORDER — LACTATED RINGERS IV SOLN
Freq: Once | INTRAVENOUS | Status: AC
  Administered 2020-01-03: 1000 mL via INTRAVENOUS

## 2020-01-03 MED ORDER — CHLORHEXIDINE GLUCONATE CLOTH 2 % EX PADS
6.0000 | MEDICATED_PAD | Freq: Once | CUTANEOUS | Status: AC
  Administered 2020-01-03: 6 via TOPICAL

## 2020-01-03 MED ORDER — PROPOFOL 10 MG/ML IV BOLUS
INTRAVENOUS | Status: AC
  Filled 2020-01-03: qty 40

## 2020-01-03 MED ORDER — MORPHINE SULFATE (PF) 2 MG/ML IV SOLN
0.5000 mg | INTRAVENOUS | Status: DC | PRN
  Administered 2020-01-03 – 2020-01-04 (×2): 1 mg via INTRAVENOUS
  Administered 2020-01-05: 0.5 mg via INTRAVENOUS
  Administered 2020-01-06 (×2): 1 mg via INTRAVENOUS
  Filled 2020-01-03 (×5): qty 1

## 2020-01-03 MED ORDER — PROPOFOL 500 MG/50ML IV EMUL
INTRAVENOUS | Status: DC | PRN
  Administered 2020-01-03: 25 ug/kg/min via INTRAVENOUS

## 2020-01-03 MED ORDER — CHLORHEXIDINE GLUCONATE 0.12 % MT SOLN
15.0000 mL | Freq: Once | OROMUCOSAL | Status: AC
  Administered 2020-01-03: 15 mL via OROMUCOSAL
  Filled 2020-01-03: qty 15

## 2020-01-03 MED ORDER — MENTHOL 3 MG MT LOZG
1.0000 | LOZENGE | OROMUCOSAL | Status: DC | PRN
  Filled 2020-01-03: qty 9

## 2020-01-03 MED ORDER — METOPROLOL TARTRATE 5 MG/5ML IV SOLN
1.0000 mg | Freq: Once | INTRAVENOUS | Status: AC
  Administered 2020-01-03: 1 mg via INTRAVENOUS

## 2020-01-03 MED ORDER — CEFAZOLIN SODIUM-DEXTROSE 2-4 GM/100ML-% IV SOLN
2.0000 g | INTRAVENOUS | Status: AC
  Administered 2020-01-03: 2 g via INTRAVENOUS

## 2020-01-03 MED ORDER — SODIUM CHLORIDE 0.9 % IV SOLN: INTRAVENOUS | Status: AC

## 2020-01-03 MED ORDER — FENTANYL CITRATE (PF) 100 MCG/2ML IJ SOLN
INTRAMUSCULAR | Status: AC
  Filled 2020-01-03: qty 2

## 2020-01-03 MED ORDER — LACTATED RINGERS IV SOLN: INTRAVENOUS | Status: DC

## 2020-01-03 MED ORDER — METOCLOPRAMIDE HCL 5 MG/ML IJ SOLN: 5.0000 mg | Freq: Three times a day (TID) | INTRAMUSCULAR | Status: DC | PRN

## 2020-01-03 MED ORDER — 0.9 % SODIUM CHLORIDE (POUR BTL) OPTIME
TOPICAL | Status: DC | PRN
  Administered 2020-01-03: 1000 mL

## 2020-01-03 MED ORDER — POLYETHYLENE GLYCOL 3350 17 G PO PACK
17.0000 g | PACK | Freq: Every day | ORAL | Status: DC
  Administered 2020-01-04 – 2020-01-06 (×3): 17 g via ORAL
  Filled 2020-01-03 (×6): qty 1

## 2020-01-03 MED ORDER — ORAL CARE MOUTH RINSE: 15.0000 mL | Freq: Once | OROMUCOSAL | Status: AC

## 2020-01-03 MED ORDER — DOCUSATE SODIUM 100 MG PO CAPS
100.0000 mg | ORAL_CAPSULE | Freq: Two times a day (BID) | ORAL | Status: DC
  Administered 2020-01-03 – 2020-01-06 (×6): 100 mg via ORAL
  Filled 2020-01-03 (×7): qty 1

## 2020-01-03 MED ORDER — BUPIVACAINE HCL (PF) 0.5 % IJ SOLN
INTRAMUSCULAR | Status: AC
  Filled 2020-01-03: qty 30

## 2020-01-03 MED ORDER — PROPOFOL 10 MG/ML IV BOLUS
INTRAVENOUS | Status: AC
  Filled 2020-01-03: qty 20

## 2020-01-03 MED ORDER — ONDANSETRON HCL 4 MG/2ML IJ SOLN: 4.0000 mg | Freq: Once | INTRAMUSCULAR | Status: DC | PRN

## 2020-01-03 MED ORDER — METOCLOPRAMIDE HCL 5 MG PO TABS
5.0000 mg | ORAL_TABLET | Freq: Three times a day (TID) | ORAL | Status: DC | PRN
  Filled 2020-01-03: qty 2

## 2020-01-03 MED ORDER — TRAMADOL HCL 50 MG PO TABS
50.0000 mg | ORAL_TABLET | Freq: Four times a day (QID) | ORAL | Status: DC
  Administered 2020-01-03 – 2020-01-04 (×4): 50 mg via ORAL
  Filled 2020-01-03 (×4): qty 1

## 2020-01-03 MED ORDER — CHLORHEXIDINE GLUCONATE 4 % EX LIQD
60.0000 mL | Freq: Once | CUTANEOUS | Status: AC
  Administered 2020-01-03: 4 via TOPICAL
  Filled 2020-01-03: qty 15

## 2020-01-03 MED ORDER — ALPRAZOLAM 1 MG PO TABS
1.0000 mg | ORAL_TABLET | Freq: Every day | ORAL | Status: DC
  Administered 2020-01-03 – 2020-01-05 (×3): 1 mg via ORAL
  Filled 2020-01-03 (×3): qty 1

## 2020-01-03 MED ORDER — POVIDONE-IODINE 10 % EX SWAB: 2.0000 "application " | Freq: Once | CUTANEOUS | Status: DC

## 2020-01-03 MED ORDER — HYDROMORPHONE HCL 1 MG/ML IJ SOLN
0.2500 mg | INTRAMUSCULAR | Status: AC | PRN
  Administered 2020-01-03 (×4): 0.25 mg via INTRAVENOUS
  Filled 2020-01-03 (×2): qty 0.5

## 2020-01-03 MED ORDER — BUPIVACAINE-EPINEPHRINE (PF) 0.5% -1:200000 IJ SOLN
INTRAMUSCULAR | Status: AC
  Filled 2020-01-03: qty 120

## 2020-01-03 MED ORDER — BUPIVACAINE-EPINEPHRINE (PF) 0.5% -1:200000 IJ SOLN
INTRAMUSCULAR | Status: DC | PRN
  Administered 2020-01-03: 30 mL

## 2020-01-03 MED ORDER — BUPIVACAINE HCL (PF) 0.5 % IJ SOLN
INTRAMUSCULAR | Status: DC | PRN
  Administered 2020-01-03: 7.5 mg via INTRATHECAL

## 2020-01-03 MED ORDER — PHENOL 1.4 % MT LIQD
1.0000 | OROMUCOSAL | Status: DC | PRN
  Filled 2020-01-03: qty 177

## 2020-01-03 MED ORDER — METOPROLOL TARTRATE 5 MG/5ML IV SOLN
2.5000 mg | Freq: Once | INTRAVENOUS | Status: AC
  Administered 2020-01-03: 2.5 mg via INTRAVENOUS

## 2020-01-03 MED ORDER — FENTANYL CITRATE (PF) 100 MCG/2ML IJ SOLN
INTRAMUSCULAR | Status: DC | PRN
  Administered 2020-01-03: 25 ug via INTRAVENOUS
  Administered 2020-01-03: 10 ug via INTRAVENOUS
  Administered 2020-01-03: 25 ug via INTRAVENOUS

## 2020-01-03 SURGICAL SUPPLY — 57 items
BAG HAMPER (MISCELLANEOUS) ×3 IMPLANT
BENZOIN TINCTURE PRP APPL 2/3 (GAUZE/BANDAGES/DRESSINGS) IMPLANT
BIT DRILL 4.8X300 (BIT) ×1 IMPLANT
BIT DRILL 4.8X300MM (BIT) ×1
BLADE HEX COATED 2.75 (ELECTRODE) ×3 IMPLANT
BLADE SURG SZ10 CARB STEEL (BLADE) ×6 IMPLANT
BNDG GAUZE ELAST 4 BULKY (GAUZE/BANDAGES/DRESSINGS) ×3 IMPLANT
CHLORAPREP W/TINT 26 (MISCELLANEOUS) ×3 IMPLANT
CLOSURE WOUND 1/2 X4 (GAUZE/BANDAGES/DRESSINGS)
CLOTH BEACON ORANGE TIMEOUT ST (SAFETY) ×3 IMPLANT
COVER LIGHT HANDLE STERIS (MISCELLANEOUS) ×6 IMPLANT
COVER MAYO STAND XLG (MISCELLANEOUS) IMPLANT
COVER WAND RF STERILE (DRAPES) ×3 IMPLANT
DECANTER SPIKE VIAL GLASS SM (MISCELLANEOUS) ×6 IMPLANT
DRAPE STERI IOBAN 125X83 (DRAPES) ×3 IMPLANT
DRESSING MEPILEX BORDER 6X8 (GAUZE/BANDAGES/DRESSINGS) ×1 IMPLANT
DRSG MEPILEX BORDER 6X8 (GAUZE/BANDAGES/DRESSINGS) ×3
DRSG MEPILEX SACRM 8.7X9.8 (GAUZE/BANDAGES/DRESSINGS) ×3 IMPLANT
GLOVE BIOGEL PI IND STRL 7.0 (GLOVE) ×2 IMPLANT
GLOVE BIOGEL PI INDICATOR 7.0 (GLOVE) ×4
GLOVE SKINSENSE NS SZ8.0 LF (GLOVE) ×4
GLOVE SKINSENSE STRL SZ8.0 LF (GLOVE) ×2 IMPLANT
GLOVE SS N UNI LF 8.5 STRL (GLOVE) ×3 IMPLANT
GOWN STRL REUS W/TWL LRG LVL3 (GOWN DISPOSABLE) ×3 IMPLANT
GOWN STRL REUS W/TWL XL LVL3 (GOWN DISPOSABLE) ×3 IMPLANT
INST SET MAJOR BONE (KITS) ×3 IMPLANT
KIT BLADEGUARD II DBL (SET/KITS/TRAYS/PACK) ×3 IMPLANT
KIT TURNOVER CYSTO (KITS) ×3 IMPLANT
MANIFOLD NEPTUNE II (INSTRUMENTS) ×3 IMPLANT
MARKER SKIN DUAL TIP RULER LAB (MISCELLANEOUS) ×3 IMPLANT
NDL HYPO 21X1.5 SAFETY (NEEDLE) ×1 IMPLANT
NDL SPNL 18GX3.5 QUINCKE PK (NEEDLE) ×1 IMPLANT
NEEDLE HYPO 21X1.5 SAFETY (NEEDLE) ×3 IMPLANT
NEEDLE SPNL 18GX3.5 QUINCKE PK (NEEDLE) ×3 IMPLANT
NS IRRIG 1000ML POUR BTL (IV SOLUTION) ×3 IMPLANT
PACK BASIC III (CUSTOM PROCEDURE TRAY) ×2
PACK SRG BSC III STRL LF ECLPS (CUSTOM PROCEDURE TRAY) ×1 IMPLANT
PAD ABD 5X9 TENDERSORB (GAUZE/BANDAGES/DRESSINGS) ×3 IMPLANT
PENCIL SMOKE EVACUATOR COATED (MISCELLANEOUS) ×3 IMPLANT
PIN GUIDE DRILL TIP 2.8X300 (DRILL) ×2 IMPLANT
SCREW CANN  FULL THD 6.5X75 (Screw) ×6 IMPLANT
SCREW CANN FT 70X6.5 NS (Screw) IMPLANT
SCREW CANN FULL THD 6.5X75 (Screw) IMPLANT
SCREW CANNULATED 6.5X70MM (Screw) ×2 IMPLANT
SET BASIN LINEN APH (SET/KITS/TRAYS/PACK) ×3 IMPLANT
SPONGE LAP 18X18 RF (DISPOSABLE) ×6 IMPLANT
STAPLER VISISTAT 35W (STAPLE) ×2 IMPLANT
STRIP CLOSURE SKIN 1/2X4 (GAUZE/BANDAGES/DRESSINGS) IMPLANT
SUT BRALON NAB BRD #1 30IN (SUTURE) ×3 IMPLANT
SUT MNCRL 0 VIOLET CTX 36 (SUTURE) ×1 IMPLANT
SUT MON AB 2-0 CT1 36 (SUTURE) IMPLANT
SUT MONOCRYL 0 CTX 36 (SUTURE) ×2
SYR 30ML LL (SYRINGE) ×3 IMPLANT
SYR BULB IRRIG 60ML STRL (SYRINGE) ×6 IMPLANT
TRAY FOLEY MTR SLVR 16FR STAT (SET/KITS/TRAYS/PACK) ×2 IMPLANT
WASHER FLAT 6.5MM (Washer) ×6 IMPLANT
YANKAUER SUCT BULB TIP 10FT TU (MISCELLANEOUS) ×3 IMPLANT

## 2020-01-03 NOTE — Anesthesia Procedure Notes (Signed)
Spinal

## 2020-01-03 NOTE — Anesthesia Preprocedure Evaluation (Addendum)
Anesthesia Evaluation  Patient identified by MRN, date of birth, ID band Patient awake    Reviewed: Allergy & Precautions, NPO status , Patient's Chart, lab work & pertinent test results, reviewed documented beta blocker date and time   Airway Mallampati: II  TM Distance: >3 FB Neck ROM: Full    Dental  (+) Upper Dentures, Lower Dentures   Pulmonary Current Smoker and Patient abstained from smoking.,  Patient is on 2 liters of oxygen  On 2 liters of oxygen   + decreased breath sounds      Cardiovascular Exercise Tolerance: Poor hypertension, Pt. on medications and Pt. on home beta blockers + Peripheral Vascular Disease   Rhythm:Regular Rate:Tachycardia     Neuro/Psych negative neurological ROS  negative psych ROS   GI/Hepatic negative GI ROS, Neg liver ROS,   Endo/Other  negative endocrine ROS  Renal/GU negative Renal ROS     Musculoskeletal  (+) Arthritis  (back pain),   Abdominal   Peds  Hematology negative hematology ROS (+)   Anesthesia Other Findings   Reproductive/Obstetrics                          Anesthesia Physical Anesthesia Plan  ASA: IV  Anesthesia Plan: Spinal and MAC   Post-op Pain Management:    Induction:   PONV Risk Score and Plan: Ondansetron  Airway Management Planned:   Additional Equipment:   Intra-op Plan:   Post-operative Plan: Possible Post-op intubation/ventilation  Informed Consent: I have reviewed the patients History and Physical, chart, labs and discussed the procedure including the risks, benefits and alternatives for the proposed anesthesia with the patient or authorized representative who has indicated his/her understanding and acceptance.     Consent reviewed with POA  Plan Discussed with: CRNA and Surgeon  Anesthesia Plan Comments:     Anesthesia Quick Evaluation

## 2020-01-03 NOTE — Anesthesia Procedure Notes (Signed)
Spinal  Patient location during procedure: OR Start time: 01/03/2020 1:36 PM End time: 01/03/2020 1:46 PM Staffing Performed: anesthesiologist  Anesthesiologist: Molli Barrows, MD Preanesthetic Checklist Completed: patient identified, IV checked, site marked, risks and benefits discussed, surgical consent, monitors and equipment checked, pre-op evaluation and timeout performed Spinal Block Patient position: left lateral decubitus Prep: ChloraPrep Patient monitoring: heart rate, cardiac monitor, continuous pulse ox and blood pressure Approach: midline Location: L3-4 Injection technique: single-shot Needle Needle type: Sprotte and Spinocan  Needle gauge: 24 G Needle length: 9 cm Needle insertion depth: 5 cm Assessment Sensory level: T10

## 2020-01-03 NOTE — Brief Op Note (Signed)
12/31/2019 - 01/03/2020  3:03 PM  PATIENT:  Denise Mendoza  86 y.o. female  PRE-OPERATIVE DIAGNOSIS:  right femoral neck fracture  POST-OPERATIVE DIAGNOSIS:  right femoral neck fracture  PROCEDURE:  Procedure(s): OPEN TREATMENT INTERNAL FIXATION RIGHT HIP (Right)   biomet 3 cannulated screws with washers all fully threaded  2/75 mm screws 170 mm screw  Findings valgus impacted right femoral neck fracture  SURGEON:  Surgeon(s) and Role:    * Sabeen Piechocki E, MD - Primary  PHYSICIAN ASSISTANT:   ASSISTANTS: none   ANESTHESIA:   spinal  EBL:  35 mL   BLOOD ADMINISTERED:none  DRAINS: none   LOCAL MEDICATIONS USED:  MARCAINE     SPECIMEN:  No Specimen  DISPOSITION OF SPECIMEN:  N/A  COUNTS:  YES  TOURNIQUET:  * No tourniquets in log *  DICTATION: .Dragon Dictation  PLAN OF CARE: Admit to inpatient   PATIENT DISPOSITION:  PACU - hemodynamically stable.   Delay start of Pharmacological VTE agent (>24hrs) due to surgical blood loss or risk of bleeding: yes  This is was done  We saw Denise Mendoza in preop we confirm the right hip as the surgical site and marked.  Thorough chart review and implant check was performed  Patient was taken to surgery for spinal anesthesia followed by insertion of Foley catheter  She was then placed on the fracture table padding on the right leg which was placed in traction and the left leg was abducted and externally rotated and flexed at the hip and placed in a padded well leg holder  The C-arm was brought in images were performed with the leg in internal rotation the fracture was reduced already required no traction or manipulation  After sterile prep and drape and timeout  I made an incision at the trochanter extended distally divided subcutaneous tissue and fascia and then split the proximal vastus lateralis musculature from the trochanteric ridge and did a subperiosteal dissection until the bone was easily exposed.  Up with the C  arm in the lateral position and estimated the version of the femoral neck and then went back to the AP and drove a pin at the inferior portion of the femoral neck into the femoral head followed by 2 additional pins above  AP and lateral x-rays confirmed the position of the pins  I placed 3 screws after measuring each 1 and drilling with a cannulated drill on the outer cortex  I thought the 1 screw was too long I took it out and put in a shorter screw each screw had washers  I chose threaded screws to prevent any collapse as the fracture was already impacted  The wound was irrigated and closed in layered fashion with 1 Braylon 0 Monocryl and 0 Monocryl followed by staples we injected 30 cc of Marcaine with epinephrine and applied a sterile dressing 

## 2020-01-03 NOTE — Anesthesia Postprocedure Evaluation (Signed)
Anesthesia Post Note  Patient: Denise Mendoza  Procedure(s) Performed: OPEN TREATMENT INTERNAL FIXATION RIGHT HIP (Right Hip)  Patient location during evaluation: PACU Anesthesia Type: General Level of consciousness: awake and alert and oriented Pain management: pain level controlled Vital Signs Assessment: post-procedure vital signs reviewed and stable Respiratory status: spontaneous breathing and respiratory function stable Cardiovascular status: blood pressure returned to baseline and tachycardic (metoprolol 2mg  was given for tachycardia, heart rate close to preop baseline) Postop Assessment: no apparent nausea or vomiting and spinal receding Anesthetic complications: no   No complications documented.   Last Vitals:  Vitals:   01/03/20 1530 01/03/20 1539  BP: (!) 150/79   Pulse: (!) 110 (!) 116  Resp: (!) 25 (!) 22  Temp:    SpO2: 92% 93%    Last Pain:  Vitals:   01/03/20 1524  TempSrc:   PainSc: 5                  Tracia Lacomb C Anmarie Fukushima

## 2020-01-03 NOTE — Brief Op Note (Signed)
12/31/2019 - 01/03/2020  2:59 PM  PATIENT:  Denise Mendoza  86 y.o. female  PRE-OPERATIVE DIAGNOSIS:  right femoral neck fracture  POST-OPERATIVE DIAGNOSIS:  right femoral neck fracture  PROCEDURE:  Procedure(s): OPEN TREATMENT INTERNAL FIXATION RIGHT HIP (Right)   biomet 3 cannulated screws with washers all fully threaded  2/75 mm screws 170 mm screw  Findings valgus impacted right femoral neck fracture  SURGEON:  Surgeon(s) and Role:    Vickki Hearing, MD - Primary  PHYSICIAN ASSISTANT:   ASSISTANTS: none   ANESTHESIA:   spinal  EBL:  35 mL   BLOOD ADMINISTERED:none  DRAINS: none   LOCAL MEDICATIONS USED:  MARCAINE     SPECIMEN:  No Specimen  DISPOSITION OF SPECIMEN:  N/A  COUNTS:  YES  TOURNIQUET:  * No tourniquets in log *  DICTATION: .Dragon Dictation  PLAN OF CARE: Admit to inpatient   PATIENT DISPOSITION:  PACU - hemodynamically stable.   Delay start of Pharmacological VTE agent (>24hrs) due to surgical blood loss or risk of bleeding: yes  This is was done  We saw Denise Mendoza in preop we confirm the right hip as the surgical site and marked.  Thorough chart review and implant check was performed  Patient was taken to surgery for spinal anesthesia followed by insertion of Foley catheter  She was then placed on the fracture table padding on the right leg which was placed in traction and the left leg was abducted and externally rotated and flexed at the hip and placed in a padded well leg holder  The C-arm was brought in images were performed with the leg in internal rotation the fracture was reduced already required no traction or manipulation  After sterile prep and drape and timeout  I made an incision at the trochanter extended distally divided subcutaneous tissue and fascia and then split the proximal vastus lateralis musculature from the trochanteric ridge and did a subperiosteal dissection until the bone was easily exposed.  Up with the C  arm in the lateral position and estimated the version of the femoral neck and then went back to the AP and drove a pin at the inferior portion of the femoral neck into the femoral head followed by 2 additional pins above  AP and lateral x-rays confirmed the position of the pins  I placed 3 screws after measuring each 1 and drilling with a cannulated drill on the outer cortex  I thought the 1 screw was too long I took it out and put in a shorter screw each screw had washers  I chose threaded screws to prevent any collapse as the fracture was already impacted  The wound was irrigated and closed in layered fashion with 1 Braylon 0 Monocryl and 0 Monocryl followed by staples we injected 30 cc of Marcaine with epinephrine and applied a sterile dressing

## 2020-01-03 NOTE — Interval H&P Note (Signed)
History and Physical Interval Note:  01/03/2020 1:16 PM  Denise Mendoza  has presented today for surgery, with the diagnosis of right femoral neck fracture.  The various methods of treatment have been discussed with the patient and family. After consideration of risks, benefits and other options for treatment, the patient has consented to  Procedure(s): CANNULATED HIP PINNING (Right) as a surgical intervention.  The patient's history has been reviewed, patient examined, no change in status, stable for surgery.  I have reviewed the patient's chart and labs.  Questions were answered to the patient's satisfaction.     Fuller [place]

## 2020-01-03 NOTE — Progress Notes (Signed)
PROGRESS NOTE    Denise Mendoza  ZDG:[id number] DOB: 17-Nov-1931 DOA: 12/31/2019 PCP: Ignatius Specking, MD   Brief Narrative:   Per HPI: Denise Regier Calhounis a 86 y.o.femalewith medical history significant forhypertension, hyperlipidemia and disc degenerative disease and lumbar spondylosis with near right L4-L5 radiculopathy status post laminectomy and discectomy (2012) who presents to the emergency department via EMS due to altered mental status. Patient was unable to provide history possibly secondary to altered mental status. History was obtained from ED physician and ED medical record. Per report, patient was found lying on the ground in front of her sisters home, it was unknown if she had a fall, last well known time was uncertain.Patient was not oriented to place when interviewed by ED physician (she thought she was at a court house), there was complaint regarding right shoulder pain and difficulty in being able to lift right leg. She was reported to deny chest pain, shortness of breath, fever, chills  -Patient has undergone right femoral neck fixation with orthopedics today. Plan for PT evaluation in a.m.  Assessment & Plan:   Principal Problem:   Altered mental status Active Problems:   Fracture of femoral neck, right (HCC)   Hypokalemia   Dehydration   Respiratory failure with hypoxia (HCC)   Vitamin B12 deficiency   Thrombocytopenia (HCC)   Leukocytosis   Acute lower UTI   Essential hypertension   Hyperlipidemia   Altered mental status secondary to multifactorial-resolved Patient was confused and agitated, she was unable to engage in an interview at bedside. At baseline, patient lives alone and able to take care of ADLs. She was suspected to have UTI and started on antibiotics Urine drug screen was negative for any drug intoxication Ethanol level was< 10 Patient was placed on bilateral soft wrist restraints due to agitation and confusion Continue fall  precaution, seizure precaution, aspiration precaution and neurochecks  Acute respiratory failure with hypoxia-improved Currently on 2 L nasal cannula oxygen ABG: 7.392/43.2/59.5/25.2 This may be due to patient beingagitated/respiratory distress/pain (patient does not use oxygen at baseline)versus underlying COPD Breathing treatments changed to Xopenex as needed Discontinue further Solu-Medrol and Pulmicort -Try incentive spirometry and wean oxygen as tolerated  Right femoral neck fracture -Status post TIF to right hip on 8/2 Right hip x-ray showedImpacted subcapital femoral neck fracture on the right Continue IV morphine 2 mg every 4 hours as needed Continue fall precaution and neurochecks Continue PT/OT eval and treat in a.m.  Hypokalemia-resolved Recheck in a.m.  Dehydration Now euvolemic  Leukocytosis/thrombocytopenia possibly reactive WBC 11.7;platelets are 149 Continue to monitor CBC with morning labs  UTI Due to patient's altered mental status, agitation and inability to provide history at this time, she was suspected to have a UTI, though UA was not convincing (protein>300, leukocytes small, bacteria, hemoglobin moderate). Patient was started on IV ceftriaxone, we shall empirically continue with same at this time with plan to de-escalate based on urine culture. Urine culture with multiple species growth noted -Plan to discontinue antibiotic by tomorrow  Vitamin B12 deficiency Vitamin B12 94;vitamin B12 1000 mcg IM x1 will be given in the morning, with plan to transition to p.o. meds once patient is more alert and able to tolerate oral intake  Essential hypertension-with elevated heart rates noted Continue IV labetalol every 6 hours as needed for SBP> 170 Continue home metoprolol  Hyperlipidemia Continue Zocor   DVT prophylaxis:SCDs Code Status:Full code Family Communication:Discussed with son at bedside 8/1 Disposition Plan:  Status is:  Inpatient  Remains inpatient appropriate because:Persistent severe electrolyte disturbances, Ongoing diagnostic testing needed not appropriate for outpatient work up and IV treatments appropriate due to intensity of illness or inability to take PO   Dispo: The patient is from:Home Anticipated d/c is to:SNF Anticipated d/c date is: 1-2 days Patient currently is not medically stable to d/c.  Consultants:  Orthopedics  Procedures:  R hip OTIF 8/2  Antimicrobials:  Anti-infectives (From admission, onward)   Start     Dose/Rate Route Frequency Ordered Stop   01/03/20 0600  ceFAZolin (ANCEF) IVPB 2g/100 mL premix        2 g 200 mL/hr over 30 Minutes Intravenous On call to O.R. 01/03/20 0546 01/03/20 1356   01/02/20 0000  [MAR Hold]  cefTRIAXone (ROCEPHIN) 1 g in sodium chloride 0.9 % 100 mL IVPB     Discontinue     (MAR Hold since Mon 01/03/2020 at 1109.Hold Reason: Transfer to a Procedural area.)   1 g 200 mL/hr over 30 Minutes Intravenous Every 24 hours 01/01/20 0422 01/04/20 2359   12/31/19 2330  cefTRIAXone (ROCEPHIN) 1 g in sodium chloride 0.9 % 100 mL IVPB        1 g 200 mL/hr over 30 Minutes Intravenous  Once 12/31/19 2323 01/01/20 0115       Subjective: Patient seen and evaluated today with no new acute complaints or concerns. No acute concerns or events noted overnight.  Objective: Vitals:   01/03/20 1530 01/03/20 1539 01/03/20 1545 01/03/20 1600  BP: (!) 150/79  (!) 155/75 139/66  Pulse: (!) 110 (!) 116 (!) 101 (!) 111  Resp: (!) 25 (!) 22 (!) 30 (!) 25  Temp:      TempSrc:      SpO2: 92% 93% 97% 95%  Weight:      Height:        Intake/Output Summary (Last 24 hours) at 01/03/2020 1610 Last data filed at 01/03/2020 1515 Gross per 24 hour  Intake 761.67 ml  Output 1485 ml  Net -723.33 ml   Filed Weights   12/31/19 1940 01/01/20 0300  Weight: 49 kg 48.8 kg    Examination:  General exam: Appears calm and  comfortable  Respiratory system: Clear to auscultation. Respiratory effort normal.  Currently on 2 L nasal cannula oxygen Cardiovascular system: S1 & S2 heard, RRR.  Gastrointestinal system: Abdomen is soft Central nervous system: Alert and awake Extremities: No edema Skin: No rashes, lesions or ulcers Psychiatry: Judgement and insight appear normal. Mood & affect appropriate.     Data Reviewed: I have personally reviewed following labs and imaging studies  CBC: Recent Labs  Lab 12/31/19 1955 01/01/20 0837 01/02/20 0739 01/03/20 0525  WBC 11.7* 11.5* 12.4* 11.6*  NEUTROABS 9.6*  --   --   --   HGB 13.4 11.4* 11.7* 12.1  HCT 41.5 38.0 38.6 40.1  MCV 94.7 98.4 96.7 97.6  PLT 149* 87* 84* 87*   Basic Metabolic Panel: Recent Labs  Lab 12/31/19 1955 12/31/19 2158 01/01/20 0837 01/02/20 0739 01/03/20 0525  NA 142  --  139 140 142  K 2.7*  --  2.4* 4.2 4.0  CL 104  --  104 108 106  CO2 26  --  22 23 28   GLUCOSE 123*  --  132* 142* 96  BUN 17  --  9 11 16   CREATININE 0.58  --  0.43* 0.45 0.53  CALCIUM 8.8*  --  7.7* 8.3* 8.5*  MG  --  1.8  --  1.7 1.8  PHOS  --   --  2.9  --   --    GFR: Estimated Creatinine Clearance: 38.9 mL/min (by C-G formula based on SCr of 0.53 mg/dL). Liver Function Tests: Recent Labs  Lab 12/31/19 1955 01/01/20 0837  AST 23 18  ALT 17 16  ALKPHOS 58 45  BILITOT 1.0 1.2  PROT 6.8 5.5*  ALBUMIN 4.1 3.2*   No results for input(s): LIPASE, AMYLASE in the last 168 hours. No results for input(s): AMMONIA in the last 168 hours. Coagulation Profile: Recent Labs  Lab 12/31/19 1955 01/01/20 0837  INR 1.1 1.3*   Cardiac Enzymes: No results for input(s): CKTOTAL, CKMB, CKMBINDEX, TROPONINI in the last 168 hours. BNP (last 3 results) No results for input(s): PROBNP in the last 8760 hours. HbA1C: No results for input(s): HGBA1C in the last 72 hours. CBG: No results for input(s): GLUCAP in the last 168 hours. Lipid Profile: No results  for input(s): CHOL, HDL, LDLCALC, TRIG, CHOLHDL, LDLDIRECT in the last 72 hours. Thyroid Function Tests: Recent Labs    12/31/19 2158  TSH 1.637   Anemia Panel: Recent Labs    12/31/19 1955 01/01/20 0837  VITAMINB12 94*  --   FOLATE  --  6.3   Sepsis Labs: No results for input(s): PROCALCITON, LATICACIDVEN in the last 168 hours.  Recent Results (from the past 240 hour(s))  SARS Coronavirus 2 by RT PCR (hospital order, performed in [facility] hospital lab) Nasopharyngeal Nasopharyngeal Swab     Status: None   Collection Time: 12/31/19  9:35 PM   Specimen: Nasopharyngeal Swab  Result Value Ref Range Status   SARS Coronavirus 2 NEGATIVE NEGATIVE Final    Comment: (NOTE) SARS-CoV-2 target nucleic acids are NOT DETECTED.  The SARS-CoV-2 RNA is generally detectable in upper and lower respiratory specimens during the acute phase of infection. The lowest concentration of SARS-CoV-2 viral copies this assay can detect is 250 copies / mL. A negative result does not preclude SARS-CoV-2 infection and should not be used as the sole basis for treatment or other patient management decisions.  A negative result may occur with improper specimen collection / handling, submission of specimen other than nasopharyngeal swab, presence of viral mutation(s) within the areas targeted by this assay, and inadequate number of viral copies (<250 copies / mL). A negative result must be combined with clinical observations, patient history, and epidemiological information.  Fact Sheet for Patients:   BoilerBrush.com.cy  Fact Sheet for Healthcare Providers: https://pope.com/  This test is not yet approved or  cleared by the [place] FDA and has been authorized for detection and/or diagnosis of SARS-CoV-2 by FDA under an Emergency Use Authorization (EUA).  This EUA will remain in effect (meaning this test can be used) for the duration of the COVID-19  declaration under Section 564(b)(1) of the Act, 21 U.S.C. section 360bbb-3(b)(1), unless the authorization is terminated or revoked sooner.  Performed at [facility], [address] [place], [place] [address]   Urine Culture     Status: Abnormal   Collection Time: 12/31/19 11:30 PM   Specimen: Urine, Clean Catch  Result Value Ref Range Status   Specimen Description   Final    URINE, CLEAN CATCH Performed at [facility], [address], [place], [place] [address]    Special Requests   Final    NONE Performed at [facility], [address], [place], [place] [address]    Culture MULTIPLE SPECIES PRESENT, SUGGEST RECOLLECTION (A)  Final   Report Status 01/02/2020 FINAL  Final  Surgical pcr screen     Status: None   Collection Time: 01/03/20  1:49 AM   Specimen: Nasal Mucosa; Nasal Swab  Result Value Ref Range Status   MRSA, PCR NEGATIVE NEGATIVE Final   Staphylococcus aureus NEGATIVE NEGATIVE Final    Comment: (NOTE) The Xpert SA Assay (FDA approved for NASAL specimens in patients 79 years of age and older), is one component of a comprehensive surveillance program. It is not intended to diagnose infection nor to guide or monitor treatment. Performed at [facility], [address] [place], [place] [address]          Radiology Studies: DG HIP OPERATIVE UNILAT WITH PELVIS RIGHT  Result Date: 01/03/2020 CLINICAL DATA:  86 year old female with subcapital right femur fracture undergoing ORIF. EXAM: OPERATIVE RIGHT HIP (WITH PELVIS IF PERFORMED) 8 VIEWS TECHNIQUE: Fluoroscopic spot image(s) were submitted for interpretation post-operatively. COMPARISON:  12/31/2019 FLUOROSCOPY TIME:  0 minutes 56 seconds FINDINGS: 8 intraoperative fluoroscopic spot views of the right hip demonstrate placement of 3 cannulated screws across the subcapital right femur fracture. No adverse hardware features. IMPRESSION: ORIF proximal right femur with no adverse features. Electronically Signed   By: Odessa Fleming M.D.   On: 01/03/2020 15:11        Scheduled Meds: . metoprolol tartrate  2.5 mg Intravenous Once  . [MAR Hold] metoprolol tartrate  25 mg Oral BID  . povidone-iodine  2 application Topical Once  . [MAR Hold] simvastatin  40 mg Oral QPM   Continuous Infusions: . [MAR Hold] cefTRIAXone (ROCEPHIN)  IV 1 g (01/03/20 0048)  . lactated ringers 100 mL/hr at 01/03/20 1325     LOS: 3 days    Time spent: 30 minutes    Nekia Maxham Hoover Brunette, DO [facility]  If 7PM-7AM, please contact night-coverage [email or web address] 01/03/2020, 4:10 PM

## 2020-01-03 NOTE — Op Note (Signed)
12/31/2019 - 01/03/2020  3:03 PM  PATIENT:  Denise Mendoza  86 y.o. female  PRE-OPERATIVE DIAGNOSIS:  right femoral neck fracture  POST-OPERATIVE DIAGNOSIS:  right femoral neck fracture  PROCEDURE:  Procedure(s): OPEN TREATMENT INTERNAL FIXATION RIGHT HIP (Right)   biomet 3 cannulated screws with washers all fully threaded  2/75 mm screws 170 mm screw  Findings valgus impacted right femoral neck fracture  SURGEON:  Surgeon(s) and Role:    Vickki Hearing, MD - Primary  PHYSICIAN ASSISTANT:   ASSISTANTS: none   ANESTHESIA:   spinal  EBL:  35 mL   BLOOD ADMINISTERED:none  DRAINS: none   LOCAL MEDICATIONS USED:  MARCAINE     SPECIMEN:  No Specimen  DISPOSITION OF SPECIMEN:  N/A  COUNTS:  YES  TOURNIQUET:  * No tourniquets in log *  DICTATION: .Dragon Dictation  PLAN OF CARE: Admit to inpatient   PATIENT DISPOSITION:  PACU - hemodynamically stable.   Delay start of Pharmacological VTE agent (>24hrs) due to surgical blood loss or risk of bleeding: yes  This is was done  We saw Denise Mendoza in preop we confirm the right hip as the surgical site and marked.  Thorough chart review and implant check was performed  Patient was taken to surgery for spinal anesthesia followed by insertion of Foley catheter  She was then placed on the fracture table padding on the right leg which was placed in traction and the left leg was abducted and externally rotated and flexed at the hip and placed in a padded well leg holder  The C-arm was brought in images were performed with the leg in internal rotation the fracture was reduced already required no traction or manipulation  After sterile prep and drape and timeout  I made an incision at the trochanter extended distally divided subcutaneous tissue and fascia and then split the proximal vastus lateralis musculature from the trochanteric ridge and did a subperiosteal dissection until the bone was easily exposed.  Up with the C  arm in the lateral position and estimated the version of the femoral neck and then went back to the AP and drove a pin at the inferior portion of the femoral neck into the femoral head followed by 2 additional pins above  AP and lateral x-rays confirmed the position of the pins  I placed 3 screws after measuring each 1 and drilling with a cannulated drill on the outer cortex  I thought the 1 screw was too long I took it out and put in a shorter screw each screw had washers  I chose threaded screws to prevent any collapse as the fracture was already impacted  The wound was irrigated and closed in layered fashion with 1 Braylon 0 Monocryl and 0 Monocryl followed by staples we injected 30 cc of Marcaine with epinephrine and applied a sterile dressing

## 2020-01-04 ENCOUNTER — Inpatient Hospital Stay (HOSPITAL_COMMUNITY): Payer: Medicare Other

## 2020-01-04 LAB — CBC
HCT: 40.3 % (ref 36.0–46.0)
Hemoglobin: 11.9 g/dL — ABNORMAL LOW (ref 12.0–15.0)
MCH: 29.2 pg (ref 26.0–34.0)
MCHC: 29.5 g/dL — ABNORMAL LOW (ref 30.0–36.0)
MCV: 99 fL (ref 80.0–100.0)
Platelets: 73 10*3/uL — ABNORMAL LOW (ref 150–400)
RBC: 4.07 MIL/uL (ref 3.87–5.11)
RDW: 15.9 % — ABNORMAL HIGH (ref 11.5–15.5)
WBC: 8.4 10*3/uL (ref 4.0–10.5)
nRBC: 0 % (ref 0.0–0.2)

## 2020-01-04 LAB — BASIC METABOLIC PANEL
Anion gap: 8 (ref 5–15)
BUN: 15 mg/dL (ref 8–23)
CO2: 27 mmol/L (ref 22–32)
Calcium: 7.6 mg/dL — ABNORMAL LOW (ref 8.9–10.3)
Chloride: 104 mmol/L (ref 98–111)
Creatinine, Ser: 0.49 mg/dL (ref 0.44–1.00)
GFR calc Af Amer: >60 mL/min (ref >60)
GFR calc non Af Amer: >60 mL/min (ref >60)
Glucose, Bld: 98 mg/dL (ref 70–99)
Potassium: 3.2 mmol/L — ABNORMAL LOW (ref 3.5–5.1)
Sodium: 139 mmol/L (ref 135–145)

## 2020-01-04 LAB — MAGNESIUM: Magnesium: 1.6 mg/dL — ABNORMAL LOW (ref 1.7–2.4)

## 2020-01-04 LAB — BRAIN NATRIURETIC PEPTIDE: B Natriuretic Peptide: 756 pg/mL — ABNORMAL HIGH (ref 0.0–100.0)

## 2020-01-04 MED ORDER — POTASSIUM CHLORIDE CRYS ER 20 MEQ PO TBCR
40.0000 meq | EXTENDED_RELEASE_TABLET | Freq: Once | ORAL | Status: AC
  Administered 2020-01-04: 40 meq via ORAL
  Filled 2020-01-04: qty 2

## 2020-01-04 MED ORDER — ACETAMINOPHEN 325 MG PO TABS
650.0000 mg | ORAL_TABLET | Freq: Four times a day (QID) | ORAL | Status: AC
  Administered 2020-01-04 – 2020-01-05 (×3): 650 mg via ORAL
  Filled 2020-01-04 (×3): qty 2

## 2020-01-04 MED ORDER — KETOROLAC TROMETHAMINE 15 MG/ML IJ SOLN
15.0000 mg | Freq: Four times a day (QID) | INTRAMUSCULAR | Status: DC | PRN
  Administered 2020-01-04 – 2020-01-06 (×5): 15 mg via INTRAVENOUS
  Filled 2020-01-04 (×5): qty 1

## 2020-01-04 MED ORDER — MAGNESIUM SULFATE 2 GM/50ML IV SOLN
2.0000 g | Freq: Once | INTRAVENOUS | Status: AC
  Administered 2020-01-04: 2 g via INTRAVENOUS
  Filled 2020-01-04: qty 50

## 2020-01-04 NOTE — TOC Initial Note (Addendum)
Transition of Care Denise Mendoza) - Initial/Assessment Note   Patient Details  Name: Denise Mendoza MRN: [id number] Date of Birth: 12/21/1931  Transition of Care Denise [facility]za) CM/SW Contact:    Denise Schlein, LCSW Phone Number: 01/04/2020, 2:55 PM  Clinical Narrative: Patient is an 86 year old female who was admitted for AMS, fracture of right femoral neck, hypokalemia, dehydration, respiratory failure with hypoxia, vitamin B12 deficiency, thrombocytopenia, leukocytosis, acute lower UTI, hypertension, and hyperlipidemia. Patient had surgery for fracture of right femoral neck on 01/03/20. PT evaluation recommends HH rather than SNF and patient's need for a rolling walker.  CSW called patient's son, Denise Mendoza, to follow up with whether family wanted SNF placement or HH. Son reported the patient wants to discharge [facility] with [facility] even though she lives alone as he and the patient's sister can assist with the patient's care. Son reported he spoke with the PT and was told the patient will also need a RW to go [facility]. Son agreeable to [facility] referral to Denise and DME referral to [facility]. CSW made referral for RW walker to Denise Mendoza with [facility]. Referral made to Denise Mendoza with Denise for Denise [facility]. TOC to follow.  Addendum: CSW updated patient will need [facility] O2 as patient has emphysema per x-ray report today and a pneumothorax. Referral made to Denise Mendoza with [facility]. Awaiting O2 sat note.  Expected Discharge Plan: [facility] Barriers to Discharge: Continued Medical Work up  Patient Goals and CMS Choice Patient states their goals for this hospitalization and ongoing recovery are:: Discharge [facility] with Denise [facility] CMS Medicare.gov Compare Post Acute Care list provided to:: Patient Represenative (must comment) Denise Mendoza (son)) Choice offered to / list presented to : Adult Children  Expected Discharge Plan and Services Expected Discharge Plan: [facility] In-house Referral: Clinical Social Work Discharge Planning  Services: NA Post Acute Care Choice: [facility] Living arrangements for the past 2 months: Single Family [facility]            DME Arranged: Walker rolling DME Agency: Denise M[facility]za Date DME Agency Contacted: 01/04/20 Time DME Agency Contacted: 1445 Representative spoke with at DME Agency: Denise M[facility]za HH Arranged: PT HH Agency: Denise [facility] (formerly [facility]) Date HH Agency Contacted: 01/04/20 Time HH Agency Contacted: 1418 Representative spoke with at [facility] Agency: Denise [facility]  Prior Living Arrangements/Services Living arrangements for the past 2 months: Single Family [facility] Lives with:: Self Patient language and need for interpreter reviewed:: Yes Do you feel safe going back to the place where you live?: Yes      Need for Family Participation in Patient Care: Yes (Comment) (Patient admitted for AMS following fall) Care giver support system in place?: Yes (comment) Denise Mendoza (son); patient's sister) Criminal Activity/Legal Involvement Pertinent to Current Situation/Hospitalization: No - Comment as needed  Activities of Daily Living [facility] Assistive Devices/Equipment: Cane (specify quad or straight) ADL Screening (condition at time of admission) Patient's cognitive ability adequate to safely complete daily activities?: Yes Is the patient deaf or have difficulty hearing?: No Does the patient have difficulty seeing, even when wearing glasses/contacts?: No Does the patient have difficulty concentrating, remembering, or making decisions?: Yes Patient able to express need for assistance with ADLs?: No Does the patient have difficulty dressing or bathing?: Yes Independently performs ADLs?: Yes (appropriate for developmental age) Does the patient have difficulty walking or climbing stairs?: No Weakness of Legs: None Weakness of Arms/Hands: None  Permission Sought/Granted Permission sought to share information with : Magazine features editor  Permission granted to share information with  : Yes, Verbal Permission Granted Permission granted to share info w AGENCY: Denise; [facility]  Emotional Assessment Appearance:: Appears stated age Orientation: : Oriented to Self, Oriented to Place, Oriented to  Time, Oriented to Situation Alcohol / Substance Use: Tobacco Use Psych Involvement: No (comment)  Admission diagnosis:  Hypokalemia [E87.6] Delirium [R41.0] Altered mental status [R41.82] Acute cystitis without hematuria [N30.00] Patient Active Problem List   Diagnosis Date Noted  . Fracture of femoral neck, right (HCC) 01/01/2020  . Hypokalemia 01/01/2020  . Dehydration 01/01/2020  . Respiratory failure with hypoxia (HCC) 01/01/2020  . Vitamin B12 deficiency 01/01/2020  . Thrombocytopenia (HCC) 01/01/2020  . Leukocytosis 01/01/2020  . Acute lower UTI 01/01/2020  . Essential hypertension 01/01/2020  . Hyperlipidemia 01/01/2020  . Altered mental status 12/31/2019  . Bilateral leg pain 05/12/2012  . Hip pain 05/12/2012   PCP:  Denise Specking, MD Pharmacy:   [facility] - [place], [place] - [address] [place] [address] Phone: [phone] Fax: [phone]  [facility] - [place], [place] - [address] [place] [address] Phone: [phone] Fax: [phone]  Readmission Risk Interventions No flowsheet data found.

## 2020-01-04 NOTE — Addendum Note (Signed)
Addendum  created 01/04/20 1358 by Shanon Payor, CRNA   Intraprocedure Event edited, Intraprocedure Staff edited

## 2020-01-04 NOTE — Anesthesia Postprocedure Evaluation (Signed)
Anesthesia Post Note  Patient: Denise Mendoza  Procedure(s) Performed: OPEN TREATMENT INTERNAL FIXATION RIGHT HIP (Right Hip)  Patient location during evaluation: PACU Anesthesia Type: General Level of consciousness: awake, oriented, awake and alert and patient cooperative Pain management: pain level controlled Vital Signs Assessment: post-procedure vital signs reviewed and stable Respiratory status: spontaneous breathing, respiratory function stable, nonlabored ventilation and patient connected to face mask oxygen Cardiovascular status: blood pressure returned to baseline and stable Postop Assessment: no headache and no backache Anesthetic complications: no   No complications documented.   Last Vitals:  Vitals:   01/03/20 2126 01/04/20 0602  BP: (!) 160/72 (!) 145/61  Pulse: (!) 103 96  Resp: 20 18  Temp: 37.1 C 36.8 C  SpO2: 100% 98%    Last Pain:  Vitals:   01/04/20 0602  TempSrc: Oral  PainSc:                  Brynda Peon

## 2020-01-04 NOTE — Plan of Care (Signed)
°  Problem: Acute Rehab PT Goals(only PT should resolve) Goal: Pt Will Go Supine/Side To Sit Outcome: Progressing Flowsheets (Taken 01/04/2020 1318) Pt will go Supine/Side to Sit: with supervision Goal: Pt Will Go Sit To Supine/Side Outcome: Progressing Flowsheets (Taken 01/04/2020 1318) Pt will go Sit to Supine/Side: with supervision Goal: Patient Will Transfer Sit To/From Stand Outcome: Progressing Flowsheets (Taken 01/04/2020 1318) Patient will transfer sit to/from stand: with supervision Goal: Pt Will Transfer Bed To Chair/Chair To Bed Outcome: Progressing Flowsheets (Taken 01/04/2020 1318) Pt will Transfer Bed to Chair/Chair to Bed: with supervision Goal: Pt Will Ambulate Outcome: Progressing Flowsheets (Taken 01/04/2020 1318) Pt will Ambulate:  100 feet  with supervision  with rolling walker Goal: Pt/caregiver will Perform Home Exercise Program Outcome: Progressing Flowsheets (Taken 01/04/2020 1318) Pt/caregiver will Perform Home Exercise Program:  For increased strengthening  For improved balance  Independently   Tori Lemuel Boodram PT, DPT 01/04/20, 1:19 PM [phone]

## 2020-01-04 NOTE — Transfer of Care (Addendum)
Immediate Anesthesia Transfer of Care Note  Patient: Denise Mendoza  Procedure(s) Performed: OPEN TREATMENT INTERNAL FIXATION RIGHT HIP (Right Hip)  Patient Location: PACU  Anesthesia Type:Regional  Level of Consciousness: awake, alert , oriented and patient cooperative  Airway & Oxygen Therapy: Patient Spontanous Breathing and Patient connected to nasal cannula oxygen  Post-op Assessment: Report given to RN, Post -op Vital signs reviewed and stable and Patient moving all extremities  Post vital signs: Reviewed and stable  Last Vitals:  Vitals Value Taken Time  BP 145/61 01/04/20 0602  Temp 36.8 C 01/04/20 0602  Pulse 96 01/04/20 0602  Resp 18 01/04/20 0602  SpO2 98 % 01/04/20 0602    Last Pain:  Vitals:   01/04/20 0602  TempSrc: Oral  PainSc:       Patients Stated Pain Goal: 1 (01/03/20 2135)  Complications: No complications documented.

## 2020-01-04 NOTE — Evaluation (Signed)
Physical Therapy Evaluation Patient Details Name: Denise Mendoza MRN: [id number] DOB: 07/31/1931 Today's Date: 01/04/2020   History of Present Illness  86 y.o. female with medical history significant for hypertension, hyperlipidemia and disc degenerative disease and lumbar spondylosis with near right L4-L5 radiculopathy status post laminectomy and discectomy (2012) who presents to the emergency department via EMS due to altered mental status and fall. Pt with R femoral neck fracture and s/p internal fixation R cannulated hip pinning on 01/03/20.  Clinical Impression  Pt admitted with above diagnosis. Pt requires increased time with mobility due to pain in R hip. Pt legally blind, requiring increased time and cues to complete task. Pt's son in room during eval offering cues and encouragement with mobility. Ambulation distance limited by pain. Pt's nasal cannula appears to fit poorly, SpO2 reading 73% on RA but pt without complaints; adjusted nasal cannula with 4L and cued pt on pursed lip breathing with SpO2 >92% within 1 minute. Decreased to 3L O2 and SpO2 remains >92% with mobility; pt on 3L at [facility]. RN notified of fluctuating readings and poorly fit nasal cannula; nurse tech in room at [facility] assisting pt with feeding and aware of nasal cannula issue. Pt and pt's son adamant that pt has good family support at home and decline SNF recommendation. Pt currently with functional limitations due to the deficits listed below (see PT Problem List). Pt will benefit from skilled PT to increase their independence and safety with mobility to allow discharge to the venue listed below.         Follow Up Recommendations Home health PT;Supervision/Assistance - 24 hour    Equipment Recommendations  Rolling walker with 5" wheels;3in1 (PT)    Recommendations for Other Services       Precautions / Restrictions Precautions Precautions: Fall Restrictions Weight Bearing Restrictions: No      Mobility  Bed  Mobility Overal bed mobility: Needs Assistance Bed Mobility: Supine to Sit  Supine to sit: Min guard  General bed mobility comments: slow, labored movement, requiring significantly increased time to come to sitting EOB, no physical assist to upright trunk or slide RLE to EOB  Transfers Overall transfer level: Needs assistance Equipment used: Rolling walker (2 wheeled) Transfers: Sit to/from [employer] Sit to Stand: Min guard Stand pivot transfers: Min guard  General transfer comment: cued for hand placement to push from seated surface to rise, increased time and BUE assisting to power up; min guard pivoting over to chair at bedside requiring increased time  Ambulation/Gait Ambulation/Gait assistance: Min guard Gait Distance (Feet): 5 Feet Assistive device: Rolling walker (2 wheeled) Gait Pattern/deviations: Step-through pattern;Decreased stride length;Decreased weight shift to right;Narrow base of support Gait velocity: decreased   General Gait Details: short steps with decreased hip/knee flexion in swing, slightly decreased weight-shift to RLE, narrow BOS but improves with cues, no overt loss of balance  Stairs            Wheelchair Mobility    Modified Rankin (Stroke Patients Only)       Balance Overall balance assessment: Needs assistance Sitting-balance support: Feet supported;Bilateral upper extremity supported Sitting balance-Leahy Scale: Good Sitting balance - Comments: seated EOB   Standing balance support: During functional activity;Bilateral upper extremity supported Standing balance-Leahy Scale: Poor Standing balance comment: reliant on RW for support        Pertinent Vitals/Pain Pain Assessment: 0-10 Pain Score: 5  Pain Location: R hip Pain Intervention(s): Limited activity within patient's tolerance;Monitored during session;Premedicated before session;Repositioned  Home Living Family/patient expects to be discharged to:: Private  residence Living Arrangements: Alone Available Help at Discharge: Family;Available PRN/intermittently   Home Access: Stairs to enter Entrance Stairs-Rails: None (pole on bil sides can hold on to) Entrance Stairs-Number of Steps: 2 Home Layout: One level Home Equipment: Cane - single point      Prior Function Level of Independence: Independent with assistive device(s)         Comments: Pt household ambulator with SPC, independent with ADLs; son performs grocery shopping and provides transportation. Pt reports last fall ~2 years ago. Pt reports she is legally blind, but is independent at home with all mobility.     Hand Dominance   Dominant Hand: Right    Extremity/Trunk Assessment   Upper Extremity Assessment Upper Extremity Assessment: Overall WFL for tasks assessed    Lower Extremity Assessment Lower Extremity Assessment: RLE deficits/detail (LLE strength 4+/5) RLE Deficits / Details: AROM WNL, ankle/knee strength 4/5, hip 3/5; denies numbness/tingling RLE Sensation: WNL    Cervical / Trunk Assessment Cervical / Trunk Assessment: Kyphotic  Communication   Communication: No difficulties  Cognition Arousal/Alertness: Awake/alert Behavior During Therapy: WFL for tasks assessed/performed Overall Cognitive Status: Within Functional Limits for tasks assessed  General Comments: Pt A&O x4, pleasant and motivated with therapy      General Comments General comments (skin integrity, edema, etc.): on RA upon arrival due to nasan cannula turned out of nose, SpO2 73% but pt without complaints; returned 4L and SpO2 >92% within 1 minute of pursed lip breathing, so decreased to 3L and maintains SpO2 >92% with mobility    Exercises     Assessment/Plan    PT Assessment Patient needs continued PT services  PT Problem List Decreased strength;Decreased range of motion;Decreased activity tolerance;Decreased balance;Decreased knowledge of use of DME;Pain       PT Treatment  Interventions DME instruction;Gait training;Stair training;Functional mobility training;Therapeutic activities;Therapeutic exercise;Balance training;Neuromuscular re-education;Patient/family education    PT Goals (Current goals can be found in the Care Plan section)  Acute Rehab PT Goals Patient Stated Goal: return home, no SNF PT Goal Formulation: With patient/family Time For Goal Achievement: 01/11/20 Potential to Achieve Goals: Good    Frequency Min 5X/week   Barriers to discharge        Co-evaluation               AM-PAC PT "6 Clicks" Mobility  Outcome Measure Help needed turning from your back to your side while in a flat bed without using bedrails?: A Little Help needed moving from lying on your back to sitting on the side of a flat bed without using bedrails?: A Little Help needed moving to and from a bed to a chair (including a wheelchair)?: A Little Help needed standing up from a chair using your arms (e.g., wheelchair or bedside chair)?: A Little Help needed to walk in hospital room?: A Little Help needed climbing 3-5 steps with a railing? : A Lot 6 Click Score: 17    End of Session Equipment Utilized During Treatment: Gait belt;Oxygen Activity Tolerance: Patient limited by pain Patient left: in chair;with call bell/phone within reach;with chair alarm set;with nursing/sitter in room;with family/visitor present Nurse Communication: Mobility status;Other (comment) (decreased O2 to 3L and poor fitting nasal cannula) PT Visit Diagnosis: Unsteadiness on feet (R26.81);Other abnormalities of gait and mobility (R26.89);Muscle weakness (generalized) (M62.81);Pain Pain - Right/Left: Right Pain - part of body: Hip    Time: 9509-3267 PT Time Calculation (min) (ACUTE ONLY): 61 min  Charges:   PT Evaluation $PT Eval Low Complexity: 1 Low PT Treatments $Therapeutic Activity: 23-37 mins         Tori Zela Sobieski PT, DPT 01/04/20, 1:12 PM [phone]

## 2020-01-04 NOTE — Progress Notes (Addendum)
PROGRESS NOTE    Denise Mendoza  WUJ:[id number] DOB: 09-22-1931 DOA: 12/31/2019 PCP: Ignatius Specking, MD   Brief Narrative:   Per HPI: Denise Hegwood Calhounis a 86 y.o.femalewith medical history significant forhypertension, hyperlipidemia and disc degenerative disease and lumbar spondylosis with near right L4-L5 radiculopathy status post laminectomy and discectomy (2012) who presents to the emergency department via EMS due to altered mental status. Patient was unable to provide history possibly secondary to altered mental status. History was obtained from ED physician and ED medical record. Per report, patient was found lying on the ground in front of her sisters home, it was unknown if she had a fall, last well known time was uncertain.Patient was not oriented to place when interviewed by ED physician (she thought she was at a court house), there was complaint regarding right shoulder pain and difficulty in being able to lift right leg. She was reported to deny chest pain, shortness of breath, fever, chills  -Patient has undergone right femoral neck fixation with orthopedics on 8/2. SNF/rehab placement was anticipated earlier, but son and patient would like to go home and therefore home health PT will be arranged.  Blood pressures are quite low after pain medications were given and patient appears lethargic.  She will need ongoing monitoring at least overnight.  Chest x-ray shows new pneumothorax as a cause of her hypoxemia.  Assessment & Plan:   Principal Problem:   Altered mental status Active Problems:   Fracture of femoral neck, right (HCC)   Hypokalemia   Dehydration   Respiratory failure with hypoxia (HCC)   Vitamin B12 deficiency   Thrombocytopenia (HCC)   Leukocytosis   Acute lower UTI   Essential hypertension   Hyperlipidemia   Altered mental status secondary to multifactorial-resolved Patient was confused and agitated, she was unable to engage in an interview at  bedside. At baseline, patient lives alone and able to take care of ADLs. She was suspected to have UTI and started on antibiotics Urine drug screen was negative for any drug intoxication Ethanol level was< 10 Patient was placed on bilateral soft wrist restraints due to agitation and confusion Continue fall precaution, seizure precaution, aspiration precaution and neurochecks  Acute respiratory failure with hypoxia-persistent Currentlyon 4 L nasal cannula oxygen, but was able to wean down to 2 L prior to surgery  -Chest x-ray shows new right-sided pneumothorax as well as changes consistent with COPD.  Patient will need home oxygen arranged on discharge. -Ambulatory O2 study will be ordered ABG: 7.392/43.2/59.5/25.2 Breathing treatments changed to Xopenex as needed -No wheezing or bronchospasms noted  Right femoral neck fracture -Status post OTIF to right hip on 8/2 Right hip x-ray showedImpacted subcapital femoral neck fracture on the right Continue IV morphine 2 mg every 4 hours as needed Continue fall precaution and neurochecks PT evaluation pending with need for SNF/rehab placement  Hypokalemia/hypomagnesemia-recurrent Replete orally along with magnesium repletion Recheck in a.m.  Dehydration Now euvolemic  Leukocytosis/thrombocytopenia possibly reactive WBC 11.7;platelets are 149 Continue to monitor CBC with morning labs  UTI Due to patient's altered mental status, agitation and inability to provide history at this time, she was suspected to have a UTI, though UA was not convincing (protein>300, leukocytes small, bacteria, hemoglobin moderate). Patient was started on IV ceftriaxone, we shall empirically continue with same at this time with plan to de-escalate based on urine culture. Urine culturewith multiple species growth noted -Completed course of antibiotic treatment on 8/2  Vitamin B12 deficiency Vitamin B12 94;vitamin B12  1000 mcg IM x1 will be given  in the morning, with plan to transition to p.o. meds once patient is more alert and able to tolerate oral intake  Essential hypertension-with elevated heart rates noted Continue IV labetalol every 6 hours as needed for SBP> 170 Continue home metoprolol  Hyperlipidemia Continue Zocor  DVT prophylaxis:SCDs Code Status:Full code Family Communication:Tried calling son on 8/3 with no response Disposition Plan:  Status is: Inpatient  Remains inpatient appropriate because:Persistent severe electrolyte disturbances, Ongoing diagnostic testing needed not appropriate for outpatient work up and IV treatments appropriate due to intensity of illness or inability to take PO   Dispo: The patient is from:Home Anticipated d/c is EU:MPNT with home health PT Anticipated d/c date is: 1-2 days Patient currently is not medically stable to d/c.  She continues to have low blood pressure readings as well as lethargy from pain medications.  She will need monitoring overnight prior to discharge in a.m with home health PT and home oxygen.  Consultants:  Orthopedics  Procedures:  R hip OTIF 8/2  Antimicrobials:  Anti-infectives (From admission, onward)   Start     Dose/Rate Route Frequency Ordered Stop   01/03/20 2000  ceFAZolin (ANCEF) IVPB 2g/100 mL premix        2 g 200 mL/hr over 30 Minutes Intravenous Every 6 hours 01/03/20 1718 01/04/20 0329   01/03/20 0600  ceFAZolin (ANCEF) IVPB 2g/100 mL premix        2 g 200 mL/hr over 30 Minutes Intravenous On call to O.R. 01/03/20 0546 01/03/20 1356   01/02/20 0000  cefTRIAXone (ROCEPHIN) 1 g in sodium chloride 0.9 % 100 mL IVPB        1 g 200 mL/hr over 30 Minutes Intravenous Every 24 hours 01/01/20 0422 01/04/20 0129   12/31/19 2330  cefTRIAXone (ROCEPHIN) 1 g in sodium chloride 0.9 % 100 mL IVPB        1 g 200 mL/hr over 30 Minutes Intravenous  Once 12/31/19 2323 01/01/20 0115        Subjective: Patient seen and evaluated today with no new acute complaints or concerns. No acute concerns or events noted overnight.  She complains of minimal hip pain that requires intermittent pain medications.  She denies any shortness of breath, chest pain, or cough.  Objective: Vitals:   01/03/20 1600 01/03/20 1700 01/03/20 2126 01/04/20 0602  BP: 139/66 (!) 157/75 (!) 160/72 (!) 145/61  Pulse: (!) 111 (!) 103 (!) 103 96  Resp: (!) 25 20 20 18   Temp:  98.4 F (36.9 C) 98.8 F (37.1 C) 98.2 F (36.8 C)  TempSrc:  Oral Oral Oral  SpO2: 95% 100% 100% 98%  Weight:      Height:        Intake/Output Summary (Last 24 hours) at 01/04/2020 1059 Last data filed at 01/04/2020 0700 Gross per 24 hour  Intake 1709.86 ml  Output 2085 ml  Net -375.14 ml   Filed Weights   12/31/19 1940 01/01/20 0300  Weight: 49 kg 48.8 kg    Examination:  General exam: Appears calm and comfortable  Respiratory system: Clear to auscultation. Respiratory effort normal.  Currently on 4 L nasal cannula oxygen. Cardiovascular system: S1 & S2 heard, RRR.  Gastrointestinal system: Abdomen is nondistended, soft and nontender. No organomegaly or masses felt. Normal bowel sounds heard. Central nervous system: Alert and oriented. No focal neurological deficits. Extremities: Symmetric 5 x 5 power. Skin: No rashes, lesions or ulcers Psychiatry: Judgement and insight appear  normal. Mood & affect appropriate.     Data Reviewed: I have personally reviewed following labs and imaging studies  CBC: Recent Labs  Lab 12/31/19 1955 01/01/20 0837 01/02/20 0739 01/03/20 0525 01/04/20 0611  WBC 11.7* 11.5* 12.4* 11.6* 8.4  NEUTROABS 9.6*  --   --   --   --   HGB 13.4 11.4* 11.7* 12.1 11.9*  HCT 41.5 38.0 38.6 40.1 40.3  MCV 94.7 98.4 96.7 97.6 99.0  PLT 149* 87* 84* 87* 73*   Basic Metabolic Panel: Recent Labs  Lab 12/31/19 1955 12/31/19 2158 01/01/20 0837 01/02/20 0739 01/03/20 0525 01/04/20 0611   NA 142  --  139 140 142 139  K 2.7*  --  2.4* 4.2 4.0 3.2*  CL 104  --  104 108 106 104  CO2 26  --  GLUCOSE 123*  --  132* 142* 96 98  BUN 17  --  CREATININE 0.58  --  0.43* 0.45 0.53 0.49  CALCIUM 8.8*  --  7.7* 8.3* 8.5* 7.6*  MG  --  1.8  --  1.7 1.8 1.6*  PHOS  --   --  2.9  --   --   --    GFR: Estimated Creatinine Clearance: 38.9 mL/min (by C-G formula based on SCr of 0.49 mg/dL). Liver Function Tests: Recent Labs  Lab 12/31/19 1955 01/01/20 0837  AST 23 18  ALT 17 16  ALKPHOS 58 45  BILITOT 1.0 1.2  PROT 6.8 5.5*  ALBUMIN 4.1 3.2*   No results for input(s): LIPASE, AMYLASE in the last 168 hours. No results for input(s): AMMONIA in the last 168 hours. Coagulation Profile: Recent Labs  Lab 12/31/19 1955 01/01/20 0837  INR 1.1 1.3*   Cardiac Enzymes: No results for input(s): CKTOTAL, CKMB, CKMBINDEX, TROPONINI in the last 168 hours. BNP (last 3 results) No results for input(s): PROBNP in the last 8760 hours. HbA1C: No results for input(s): HGBA1C in the last 72 hours. CBG: No results for input(s): GLUCAP in the last 168 hours. Lipid Profile: No results for input(s): CHOL, HDL, LDLCALC, TRIG, CHOLHDL, LDLDIRECT in the last 72 hours. Thyroid Function Tests: No results for input(s): TSH, T4TOTAL, FREET4, T3FREE, THYROIDAB in the last 72 hours. Anemia Panel: No results for input(s): VITAMINB12, FOLATE, FERRITIN, TIBC, IRON, RETICCTPCT in the last 72 hours. Sepsis Labs: No results for input(s): PROCALCITON, LATICACIDVEN in the last 168 hours.  Recent Results (from the past 240 hour(s))  SARS Coronavirus 2 by RT PCR (hospital order, performed in [facility] hospital lab) Nasopharyngeal Nasopharyngeal Swab     Status: None   Collection Time: 12/31/19  9:35 PM   Specimen: Nasopharyngeal Swab  Result Value Ref Range Status   SARS Coronavirus 2 NEGATIVE NEGATIVE Final    Comment: (NOTE) SARS-CoV-2 target nucleic acids are NOT  DETECTED.  The SARS-CoV-2 RNA is generally detectable in upper and lower respiratory specimens during the acute phase of infection. The lowest concentration of SARS-CoV-2 viral copies this assay can detect is 250 copies / mL. A negative result does not preclude SARS-CoV-2 infection and should not be used as the sole basis for treatment or other patient management decisions.  A negative result may occur with improper specimen collection / handling, submission of specimen other than nasopharyngeal swab, presence of viral mutation(s) within the areas targeted by this assay, and inadequate number of viral copies (<250 copies / mL). A negative result must be  combined with clinical observations, patient history, and epidemiological information.  Fact Sheet for Patients:   BoilerBrush.com.cy  Fact Sheet for Healthcare Providers: https://pope.com/  This test is not yet approved or  cleared by the [place] FDA and has been authorized for detection and/or diagnosis of SARS-CoV-2 by FDA under an Emergency Use Authorization (EUA).  This EUA will remain in effect (meaning this test can be used) for the duration of the COVID-19 declaration under Section 564(b)(1) of the Act, 21 U.S.C. section 360bbb-3(b)(1), unless the authorization is terminated or revoked sooner.  Performed at [facility], [address] [place], [place] [address]   Urine Culture     Status: Abnormal   Collection Time: 12/31/19 11:30 PM   Specimen: Urine, Clean Catch  Result Value Ref Range Status   Specimen Description   Final    URINE, CLEAN CATCH Performed at [facility], [address], [place], [place] [address]    Special Requests   Final    NONE Performed at [facility], [address] [place], [place] [address]    Culture MULTIPLE SPECIES PRESENT, SUGGEST RECOLLECTION (A)  Final   Report Status 01/02/2020 FINAL  Final  Surgical pcr screen     Status: None    Collection Time: 01/03/20  1:49 AM   Specimen: Nasal Mucosa; Nasal Swab  Result Value Ref Range Status   MRSA, PCR NEGATIVE NEGATIVE Final   Staphylococcus aureus NEGATIVE NEGATIVE Final    Comment: (NOTE) The Xpert SA Assay (FDA approved for NASAL specimens in patients 83 years of age and older), is one component of a comprehensive surveillance program. It is not intended to diagnose infection nor to guide or monitor treatment. Performed at [facility], [address] [place], [place] [address]          Radiology Studies: DG Pelvis Portable  Result Date: 01/03/2020 CLINICAL DATA:  Post right hip surgery. EXAM: PORTABLE PELVIS 1-2 VIEWS COMPARISON:  01/03/2020 FINDINGS: The patient has undergone percutaneous pinning of the right hip. The osseous alignment is stable to slightly improved. There are expected postsurgical changes including subcutaneous gas and overlying soft tissue edema. There are mild degenerative changes of both hips. IMPRESSION: Status post percutaneous pinning of the right hip. Electronically Signed   By: Katherine Mantle M.D.   On: 01/03/2020 18:04   DG HIP OPERATIVE UNILAT WITH PELVIS RIGHT  Result Date: 01/03/2020 CLINICAL DATA:  86 year old female with subcapital right femur fracture undergoing ORIF. EXAM: OPERATIVE RIGHT HIP (WITH PELVIS IF PERFORMED) 8 VIEWS TECHNIQUE: Fluoroscopic spot image(s) were submitted for interpretation post-operatively. COMPARISON:  12/31/2019 FLUOROSCOPY TIME:  0 minutes 56 seconds FINDINGS: 8 intraoperative fluoroscopic spot views of the right hip demonstrate placement of 3 cannulated screws across the subcapital right femur fracture. No adverse hardware features. IMPRESSION: ORIF proximal right femur with no adverse features. Electronically Signed   By: Odessa Fleming M.D.   On: 01/03/2020 15:11        Scheduled Meds: . ALPRAZolam  1 mg Oral QHS  . aspirin EC  325 mg Oral Q breakfast  . docusate sodium  100 mg Oral BID  .  metoprolol tartrate  25 mg Oral BID  . polyethylene glycol  17 g Oral Daily  . potassium chloride  40 mEq Oral Once  . simvastatin  40 mg Oral QPM  . traMADol  50 mg Oral Q6H   Continuous Infusions: . lactated ringers Stopped (01/03/20 1515)  . magnesium sulfate bolus IVPB  LOS: 4 days    Time spent: 30 minutes    Stewart Sasaki Hoover Brunette, DO [facility]  If 7PM-7AM, please contact night-coverage [email or web address] 01/04/2020, 10:59 AM

## 2020-01-05 ENCOUNTER — Encounter (HOSPITAL_COMMUNITY): Payer: Self-pay | Admitting: Orthopedic Surgery

## 2020-01-05 DIAGNOSIS — J439 Emphysema, unspecified: Secondary | ICD-10-CM

## 2020-01-05 DIAGNOSIS — N3000 Acute cystitis without hematuria: Secondary | ICD-10-CM

## 2020-01-05 DIAGNOSIS — S72001D Fracture of unspecified part of neck of right femur, subsequent encounter for closed fracture with routine healing: Secondary | ICD-10-CM

## 2020-01-05 LAB — CBC
HCT: 37 % (ref 36.0–46.0)
Hemoglobin: 10.9 g/dL — ABNORMAL LOW (ref 12.0–15.0)
MCH: 29.1 pg (ref 26.0–34.0)
MCHC: 29.5 g/dL — ABNORMAL LOW (ref 30.0–36.0)
MCV: 98.7 fL (ref 80.0–100.0)
Platelets: 78 10*3/uL — ABNORMAL LOW (ref 150–400)
RBC: 3.75 MIL/uL — ABNORMAL LOW (ref 3.87–5.11)
RDW: 15.9 % — ABNORMAL HIGH (ref 11.5–15.5)
WBC: 7.5 10*3/uL (ref 4.0–10.5)
nRBC: 0 % (ref 0.0–0.2)

## 2020-01-05 LAB — BASIC METABOLIC PANEL
Anion gap: 8 (ref 5–15)
BUN: 25 mg/dL — ABNORMAL HIGH (ref 8–23)
CO2: 29 mmol/L (ref 22–32)
Calcium: 8 mg/dL — ABNORMAL LOW (ref 8.9–10.3)
Chloride: 103 mmol/L (ref 98–111)
Creatinine, Ser: 0.55 mg/dL (ref 0.44–1.00)
GFR calc Af Amer: >60 mL/min (ref >60)
GFR calc non Af Amer: >60 mL/min (ref >60)
Glucose, Bld: 96 mg/dL (ref 70–99)
Potassium: 4 mmol/L (ref 3.5–5.1)
Sodium: 140 mmol/L (ref 135–145)

## 2020-01-05 LAB — MAGNESIUM: Magnesium: 2 mg/dL (ref 1.7–2.4)

## 2020-01-05 MED ORDER — HYDROCODONE-ACETAMINOPHEN 7.5-325 MG PO TABS
1.0000 | ORAL_TABLET | Freq: Three times a day (TID) | ORAL | Status: DC | PRN
  Administered 2020-01-05 (×2): 1 via ORAL
  Filled 2020-01-05 (×2): qty 1

## 2020-01-05 MED ORDER — HYDROCODONE-ACETAMINOPHEN 7.5-325 MG PO TABS: 1.0000 | ORAL_TABLET | Freq: Four times a day (QID) | ORAL | 0 refills | Status: DC | PRN

## 2020-01-05 MED ORDER — DOCUSATE SODIUM 100 MG PO CAPS: 100.0000 mg | ORAL_CAPSULE | Freq: Two times a day (BID) | ORAL | 0 refills | Status: DC | PRN

## 2020-01-05 MED ORDER — CYANOCOBALAMIN 1000 MCG/ML IJ SOLN: INTRAMUSCULAR | 0 refills | Status: DC

## 2020-01-05 MED ORDER — ASPIRIN 325 MG PO TBEC: 325.0000 mg | DELAYED_RELEASE_TABLET | Freq: Every day | ORAL | 0 refills | Status: DC

## 2020-01-05 MED ORDER — CYANOCOBALAMIN 1000 MCG/ML IJ SOLN
1000.0000 ug | Freq: Every day | INTRAMUSCULAR | Status: DC
  Administered 2020-01-05 – 2020-01-06 (×2): 1000 ug via INTRAMUSCULAR
  Filled 2020-01-05 (×2): qty 1

## 2020-01-05 MED ORDER — METOPROLOL TARTRATE 25 MG PO TABS: 25.0000 mg | ORAL_TABLET | Freq: Two times a day (BID) | ORAL | 2 refills | Status: DC

## 2020-01-05 NOTE — Discharge Summary (Signed)
Physician Discharge Summary  Denise Mendoza ZOX:[id number] DOB: 06-10-1931 DOA: 12/31/2019  PCP: Ignatius Specking, MD  Admit date: 12/31/2019 Discharge date: 01/05/2020  Time spent: 35 minutes  Recommendations for Outpatient Follow-up:  Repeat CBC to follow hemoglobin trend Repeat basic metabolic panel to follow closely renal function Repeat magnesium level. Patient to follow-up with orthopedic service in 10 days Continue the use of full dose aspirin for DVT prophylaxis. Repeat B12 level in 5 months Continue to follow patient need for oxygen supplementation and wean off as tolerated.  Repeat chest x-ray in 3 weeks.  Discharge Diagnoses:  Principal Problem:   Altered mental status Active Problems:   Fracture of femoral neck, right (HCC)   Hypokalemia   Dehydration   Respiratory failure with hypoxia (HCC)   Vitamin B12 deficiency   Thrombocytopenia (HCC)   Leukocytosis   Acute lower UTI   Essential hypertension   Hyperlipidemia   Acute cystitis without hematuria   Pulmonary emphysema (HCC)   Discharge Condition: Stable and improved.  Patient is status post right hip ORIF and instructions to follow-up with PCP and orthopedic service as an outpatient.  Physical therapy has recommended skilled nursing facility for rehabilitation for patient and family has declined those recommendations and would like to go home with home health services.  A walker and home health PT has been arranged.  CODE STATUS: Full code.  Diet recommendation: Heart healthy diet.  Filed Weights   12/31/19 1940 01/01/20 0300  Weight: 49 kg 48.8 kg    History of present illness:  As per H&P written by Dr. Thomes Dinning on 12/31/2019 Denise Mendoza is a 86 y.o. female with medical history significant for hypertension, hyperlipidemia and disc degenerative disease and lumbar spondylosis with near right L4-L5 radiculopathy status post laminectomy and discectomy (2012) who presents to the emergency department via EMS  due to altered mental status.  Patient was unable to provide history possibly secondary to altered mental status.  History was obtained from ED physician and ED medical record.  Per report, patient was found lying on the ground in front of her sisters home, it was unknown if she had a fall, last well known time was uncertain.  Patient was not oriented to place when interviewed by ED physician (she thought she was at a  court house), there was complaint regarding right shoulder pain and difficulty in being able to lift right leg.  She was reported to deny chest pain, shortness of breath, fever, chills  ED Course: In the emergency department, she was tachycardic and was at 1 time tachypneic.  Work-up in the ED showed mild leukocytosis, thrombocytopenia, hypokalemia, hypocalcemia, hyperglycemia.  Urinalysis showed proteinuria, moderate urine hemoglobin, small leukocytes and rare bacteria.  Vitamin B12 94.  ABG showed hypoxia with PO2 of 59.5.  Alcohol level was <10, TSH 1.637.  SARS coronavirus 2 was negative.  CT of head without contrast showed no acute intracranial normality.  Chest x-ray showed no active disease.  Right shoulder x-ray showed status post ORIF of the proximal marrows without complication.  Right hip x-ray showed impacted subcapital femoral neck fracture on the right.  She was empirically treated with IV ceftriaxone due to presumed UTI in the setting of altered mental status.  IV Ativan 0.5 Mg x2 was given.  Potassium was replenished and IV NS 1 L of NS was given.  Hospitalist was asked to admit.  For further evaluation and management.  At bedside, patient's confusion appeared to have worsened despite 2  doses of IV Ativan, she was already placed on bilateral soft wrist restraints, she appeared to be in distress due to increased work of breathing, supplemental oxygen at 4 LPM was provided.  Patient was laying in bed in a right lateral position with HOB raised to 30 degrees, it was then  recommended to change patient's positioning considering impacted subcapital femoral neck fracture on the right.  Breathing treatment and IV Solu-Medrol was provided due to increased work of breathing that appears to be more around her neck area.   At baseline, patient lives alone and able to take care of ADLs.  Sister lives close to her and checks on her regularly.  Hospital Course:  1-acute metabolic encephalopathy in the setting of acute UTI and pain from right hip fracture. -s/p ORIF -continue PRN pain meds -patient treated empirically for 3 days with rocephin  -mentation is back to baseline currently.  2-right hip fracture -status post ORIF -PT recommended SNF, but patient and family wants home with HHPT -walker and HH services arranged at discharge -outpatient follow up with Dr. Romeo Apple as an outpatient -PRN Vicodin prescribed and will use full dose aspirin for DVT prophylaxis.  3-acute resp failure with hypoxia  -patient with underlying emphysema -no wheezing currently -requiring 2-3L  [place] especially on exertion -found with mild spontaneous pneumothorax on x-rays -oxygen supplementation arranged at discharge -repeat CXR in 3 weeks  -continue to weaned off O2 supplementation as tolerated.  4-dehydration, hypokalemia/hypomagnesemia -repleted -IVF's provided -electrolytes stable at discharge -repeat BMET at follow up visit -patient advised to maintain adequate hydration.  5-UTI -Patient treated empirically with 3 days of Rocephin -At discharge no dysuria -Patient currently afebrile, WBCs 13 resolve mentation back to baseline. -Urine culture not helpful; multiple species isolated).  6-vitamin B12 deficiency -B12 level found to be 94 -Started on treatment with 1000 mcg injection of B12; she will continue 5 more days of intramuscular treatment, followed by once a week for a month and then monthly after that. -Repeat B12 level in 5 months.  7-essential  hypertension -Continue the use of metoprolol -Patient advised to follow heart healthy diet.  8-hyperlipidemia -Continue statin.       Procedures:  See below for x-ray reports.  Status post ORIF right hip on 01/03/2020  Consultations:  Orthopedic service  Discharge Exam: Vitals:   01/05/20 0600 01/05/20 0950  BP: 136/63 (!) 127/53  Pulse: 78   Resp: 20   Temp: 98 F (36.7 C)   SpO2: 99%     General: Good oxygen saturation on 2-3 L supplementation; no chest pain, no nausea, no vomiting.  Patient is complaining of right hip pain. Cardiovascular: S1-S2, no rubs, no gallops, no JVD. Respiratory: Good air movement bilaterally; no using accessory muscles; no wheezing, no crackles. Abdomen: Soft, nontender, nondistended, positive bowel sounds Extremities: No cyanosis or clubbing.  Discharge Instructions   Discharge Instructions    Diet - low sodium heart healthy   Complete by: As directed    Discharge instructions   Complete by: As directed    The medications are prescribed Follow-up with PCP in 2 weeks Follow-up with orthopedic surgeon in 10 days. Maintain adequate hydration.   Discharge wound care:   Complete by: As directed    Keep dressing in place until outpatient follow up with Dr. Romeo Apple.     Allergies as of 01/05/2020   No Known Allergies     Medication List    STOP taking these medications   traMADol 50 MG tablet Commonly  known as: ULTRAM     TAKE these medications   ALPRAZolam 0.5 MG tablet Commonly known as: XANAX Take 1 mg by mouth 2 (two) times daily.   aspirin 325 MG EC tablet Take 1 tablet (325 mg total) by mouth daily with breakfast. Start taking on: January 06, 2020 What changed:   medication strength  how much to take  when to take this  additional instructions   cyanocobalamin 1000 MCG/ML injection Commonly known as: (VITAMIN B-12) Daily for 5 days; then weekly for a month and then monthly after that.   docusate sodium 100  MG capsule Commonly known as: COLACE Take 1 capsule (100 mg total) by mouth 2 (two) times daily as needed for mild constipation.   HYDROcodone-acetaminophen 7.5-325 MG tablet Commonly known as: NORCO Take 1 tablet by mouth every 6 (six) hours as needed for severe pain.   metoprolol tartrate 25 MG tablet Commonly known as: LOPRESSOR Take 1 tablet (25 mg total) by mouth 2 (two) times daily.   simvastatin 40 MG tablet Commonly known as: ZOCOR Take 40 mg by mouth every evening.            Durable Medical Equipment  (From admission, onward)         Start     Ordered   01/04/20 1636  For home use only DME oxygen  Once       Question Answer Comment  Length of Need Lifetime   Mode or (Route) Nasal cannula   Liters per Minute 4   Frequency Continuous (stationary and portable oxygen unit needed)   Oxygen conserving device Yes   Oxygen delivery system Gas      01/04/20 1636   01/04/20 1442  For home use only DME Walker rolling  Once       Question Answer Comment  Walker: With 5 Inch Wheels   Patient needs a walker to treat with the following condition Weakness      01/04/20 1441           Discharge Care Instructions  (From admission, onward)         Start     Ordered   01/05/20 0000  Discharge wound care:       Comments: Keep dressing in place until outpatient follow up with Dr. Romeo Apple.   01/05/20 1545         No Known Allergies  Follow-up Information    Vyas, Dhruv B, MD. Schedule an appointment as soon as possible for a visit in 2 week(s).   Specialty: Internal Medicine Contact information: [address] [place] [address] [phone]        Vickki Hearing, MD. Schedule an appointment as soon as possible for a visit in 10 day(s).   Specialties: Orthopedic Surgery, Radiology Contact information: [address] [place] [address] [phone]               The results of significant diagnostics from this hospitalization  (including imaging, microbiology, ancillary and laboratory) are listed below for reference.    Significant Diagnostic Studies: DG Shoulder Right  Result Date: 12/31/2019 CLINICAL DATA:  Fall EXAM: RIGHT SHOULDER - 2+ VIEW COMPARISON:  None. FINDINGS: The patient is status post ORIF with plate screw fixation of the proximal humerus with a healed fracture deformity. No periprosthetic lucency or acute fracture is identified. There is diffuse osteopenia. Mild cardiomegaly and aortic knob calcifications are noted. IMPRESSION: Status post ORIF of the proximal humerus without complication.  Electronically Signed   By: Jonna Clark M.D.   On: 12/31/2019 20:46   CT HEAD WO CONTRAST  Result Date: 12/31/2019 CLINICAL DATA:  Change in mental status EXAM: CT HEAD WITHOUT CONTRAST TECHNIQUE: Contiguous axial images were obtained from the base of the skull through the vertex without intravenous contrast. COMPARISON:  July 20, 2019 FINDINGS: Brain: No evidence of acute territorial infarction, hemorrhage, hydrocephalus,extra-axial collection or mass lesion/mass effect. There is dilatation the ventricles and sulci consistent with age-related atrophy. Low-attenuation changes in the deep white matter consistent with small vessel ischemia. Vascular: No hyperdense vessel or unexpected calcification. Skull: The skull is intact. No fracture or focal lesion identified. Sinuses/Orbits: The visualized paranasal sinuses and mastoid air cells are clear. The orbits and globes intact. Other: None IMPRESSION: No acute intracranial abnormality. Findings consistent with age related atrophy and chronic small vessel ischemia Electronically Signed   By: Jonna Clark M.D.   On: 12/31/2019 21:00   DG Pelvis Portable  Result Date: 01/03/2020 CLINICAL DATA:  Post right hip surgery. EXAM: PORTABLE PELVIS 1-2 VIEWS COMPARISON:  01/03/2020 FINDINGS: The patient has undergone percutaneous pinning of the right hip. The osseous alignment is stable  to slightly improved. There are expected postsurgical changes including subcutaneous gas and overlying soft tissue edema. There are mild degenerative changes of both hips. IMPRESSION: Status post percutaneous pinning of the right hip. Electronically Signed   By: Katherine Mantle M.D.   On: 01/03/2020 18:04   DG Chest Port 1 View  Result Date: 01/04/2020 CLINICAL DATA:  Hypoxemia, hip surgery yesterday, hypertension, smoker, COVID-19 negative on 12/31/2019 EXAM: PORTABLE CHEST 1 VIEW COMPARISON:  Portable exam 1423 hours compared to 12/31/2019 FINDINGS: Enlargement of cardiac silhouette. Tortuous thoracic aorta with chronic aneurysmal dilatation of aortic arch. Mediastinal contours and pulmonary vascularity normal. New small RIGHT apex pneumothorax estimated at 15-20%. Underlying emphysematous and minimal chronic interstitial changes. Minimal LEFT basilar atelectasis. No pulmonary infiltrate or acute osseous findings. IMPRESSION: New RIGHT apex pneumothorax estimated at 15-20%. Underlying COPD changes with minimal LEFT basilar atelectasis. Enlargement of cardiac silhouette. Critical Value/emergent results were called by telephone at the time of interpretation on 01/04/2020 at 1533 hrs to provider [facility] , who verbally acknowledged these results. Electronically Signed   By: Ulyses Southward M.D.   On: 01/04/2020 15:37   DG Chest Portable 1 View  Result Date: 12/31/2019 CLINICAL DATA:  Fall EXAM: PORTABLE CHEST 1 VIEW COMPARISON:  July 20, 2019 FINDINGS: There is mild cardiomegaly. Aortic knob calcifications are seen. Both lungs are clear. The visualized skeletal structures are unremarkable. IMPRESSION: No active disease. Electronically Signed   By: Jonna Clark M.D.   On: 12/31/2019 20:56   DG HIP OPERATIVE UNILAT WITH PELVIS RIGHT  Result Date: 01/03/2020 CLINICAL DATA:  86 year old female with subcapital right femur fracture undergoing ORIF. EXAM: OPERATIVE RIGHT HIP (WITH PELVIS IF PERFORMED) 8 VIEWS  TECHNIQUE: Fluoroscopic spot image(s) were submitted for interpretation post-operatively. COMPARISON:  12/31/2019 FLUOROSCOPY TIME:  0 minutes 56 seconds FINDINGS: 8 intraoperative fluoroscopic spot views of the right hip demonstrate placement of 3 cannulated screws across the subcapital right femur fracture. No adverse hardware features. IMPRESSION: ORIF proximal right femur with no adverse features. Electronically Signed   By: Odessa Fleming M.D.   On: 01/03/2020 15:11   DG Hip Unilat W or Wo Pelvis 2-3 Views Right  Result Date: 12/31/2019 CLINICAL DATA:  Found lying in yard, hip pain, initial encounter EXAM: DG HIP (WITH OR WITHOUT PELVIS) 3V RIGHT  COMPARISON:  None. FINDINGS: Pelvic ring is intact. Impacted subcapital femoral neck fracture on the right is noted. No dislocation is seen. No soft tissue abnormality is noted. IMPRESSION: Impacted subcapital femoral neck fracture on the right. Electronically Signed   By: Alcide Clever M.D.   On: 12/31/2019 23:47    Microbiology: Recent Results (from the past 240 hour(s))  SARS Coronavirus 2 by RT PCR (hospital order, performed in [facility] hospital lab) Nasopharyngeal Nasopharyngeal Swab     Status: None   Collection Time: 12/31/19  9:35 PM   Specimen: Nasopharyngeal Swab  Result Value Ref Range Status   SARS Coronavirus 2 NEGATIVE NEGATIVE Final    Comment: (NOTE) SARS-CoV-2 target nucleic acids are NOT DETECTED.  The SARS-CoV-2 RNA is generally detectable in upper and lower respiratory specimens during the acute phase of infection. The lowest concentration of SARS-CoV-2 viral copies this assay can detect is 250 copies / mL. A negative result does not preclude SARS-CoV-2 infection and should not be used as the sole basis for treatment or other patient management decisions.  A negative result may occur with improper specimen collection / handling, submission of specimen other than nasopharyngeal swab, presence of viral mutation(s) within the areas  targeted by this assay, and inadequate number of viral copies (<250 copies / mL). A negative result must be combined with clinical observations, patient history, and epidemiological information.  Fact Sheet for Patients:   BoilerBrush.com.cy  Fact Sheet for Healthcare Providers: https://pope.com/  This test is not yet approved or  cleared by the [place] FDA and has been authorized for detection and/or diagnosis of SARS-CoV-2 by FDA under an Emergency Use Authorization (EUA).  This EUA will remain in effect (meaning this test can be used) for the duration of the COVID-19 declaration under Section 564(b)(1) of the Act, 21 U.S.C. section 360bbb-3(b)(1), unless the authorization is terminated or revoked sooner.  Performed at [facility], [address], [place], [place] [address]   Urine Culture     Status: Abnormal   Collection Time: 12/31/19 11:30 PM   Specimen: Urine, Clean Catch  Result Value Ref Range Status   Specimen Description   Final    URINE, CLEAN CATCH Performed at [facility], [address], [place], [place] [address]    Special Requests   Final    NONE Performed at [facility], [address], [place], [place] [address]    Culture MULTIPLE SPECIES PRESENT, SUGGEST RECOLLECTION (A)  Final   Report Status 01/02/2020 FINAL  Final  Surgical pcr screen     Status: None   Collection Time: 01/03/20  1:49 AM   Specimen: Nasal Mucosa; Nasal Swab  Result Value Ref Range Status   MRSA, PCR NEGATIVE NEGATIVE Final   Staphylococcus aureus NEGATIVE NEGATIVE Final    Comment: (NOTE) The Xpert SA Assay (FDA approved for NASAL specimens in patients 60 years of age and older), is one component of a comprehensive surveillance program. It is not intended to diagnose infection nor to guide or monitor treatment. Performed at [facility], [address] [place], [place] [address]      Labs: Basic Metabolic Panel: Recent  Labs  Lab 12/31/19 1955 12/31/19 2158 01/01/20 2956 01/02/20 0739 01/03/20 0525 01/04/20 0611 01/05/20 0613  NA   < >  --  139 140 142 139 140  K   < >  --  2.4* 4.2 4.0 3.2* 4.0  CL   < >  --  104 108 106  104 103  CO2   < >  --  22 23 28 27 29   GLUCOSE   < >  --  132* 142* 96 98 96  BUN   < >  --  9 11 16 15  25*  CREATININE   < >  --  0.43* 0.45 0.53 0.49 0.55  CALCIUM   < >  --  7.7* 8.3* 8.5* 7.6* 8.0*  MG  --  1.8  --  1.7 1.8 1.6* 2.0  PHOS  --   --  2.9  --   --   --   --    < > = values in this interval not displayed.   Liver Function Tests: Recent Labs  Lab 12/31/19 1955 01/01/20 0837  AST 23 18  ALT 17 16  ALKPHOS 58 45  BILITOT 1.0 1.2  PROT 6.8 5.5*  ALBUMIN 4.1 3.2*   CBC: Recent Labs  Lab 12/31/19 1955 12/31/19 1955 01/01/20 0837 01/02/20 0739 01/03/20 0525 01/04/20 0611 01/05/20 0613  WBC 11.7*   < > 11.5* 12.4* 11.6* 8.4 7.5  NEUTROABS 9.6*  --   --   --   --   --   --   HGB 13.4   < > 11.4* 11.7* 12.1 11.9* 10.9*  HCT 41.5   < > 38.0 38.6 40.1 40.3 37.0  MCV 94.7   < > 98.4 96.7 97.6 99.0 98.7  PLT 149*   < > 87* 84* 87* 73* 78*   < > = values in this interval not displayed.    BNP (last 3 results) Recent Labs    01/04/20 0611  BNP 756.0*    Signed:  03/06/20 MD.  [facility] 01/05/2020, 3:48 PM

## 2020-01-05 NOTE — Progress Notes (Signed)
SATURATION QUALIFICATIONS: (This note is used to comply with regulatory documentation for home oxygen)  Patient Saturations on Room Air at Rest = 99%  Patient Saturations on Room Air while Ambulating = 86%  Patient Saturations on 3 Liters of oxygen while Ambulating = 94%  Please briefly explain why patient needs home oxygen: Emphysema and new left pneumothorax.

## 2020-01-05 NOTE — Care Management Important Message (Signed)
Important Message  Patient Details  Name: Denise Mendoza MRN: [id number] Date of Birth: 1932/04/24   Medicare Important Message Given:  Yes     Corey Harold 01/05/2020, 4:08 PM

## 2020-01-05 NOTE — Progress Notes (Signed)
Physical Therapy Treatment Patient Details Name: Denise Mendoza MRN: [id number] DOB: 02-12-1932 Today's Date: 01/05/2020    History of Present Illness 86 y.o. female with medical history significant for hypertension, hyperlipidemia and disc degenerative disease and lumbar spondylosis with near right L4-L5 radiculopathy status post laminectomy and discectomy (2012) who presents to the emergency department via EMS due to altered mental status and fall. Pt with R femoral neck fracture and s/p internal fixation R cannulated hip pinning on 01/03/20.    PT Comments    Patient demonstrates increased endurance/distance for ambulation with fair/good return for right heel to toe stepping without loss of balance, limited mostly due to SOB and SpO2 dropping from 92% to 80% while on 3 LPM O2.  Patient required verbal and occasional tactile cuing to complete exercises in bed, able to transfer to commode in bathroom without loss of balance and requested to go back to bed after therapy due to fatigue.  Patient will benefit from continued physical therapy in hospital and recommended venue below to increase strength, balance, endurance for safe ADLs and gait.    Follow Up Recommendations  Home health PT;Supervision/Assistance - 24 hour     Equipment Recommendations  Rolling walker with 5" wheels;3in1 (PT)    Recommendations for Other Services       Precautions / Restrictions Precautions Precautions: Fall Restrictions Weight Bearing Restrictions: Yes RLE Weight Bearing: Weight bearing as tolerated    Mobility  Bed Mobility Overal bed mobility: Needs Assistance Bed Mobility: Supine to Sit;Sit to Supine     Supine to sit: Supervision;Min guard Sit to supine: Supervision;Min guard   General bed mobility comments: demonstrates good return for moving RLE during bed mobility  Transfers Overall transfer level: Needs assistance Equipment used: Rolling walker (2 wheeled) Transfers: Sit to/from  [employer] Sit to Stand: Min guard Stand pivot transfers: Min guard       General transfer comment: good return for transferring to commode in bathroom without loss of balance and right hip pain at baseline  Ambulation/Gait Ambulation/Gait assistance: Min guard Gait Distance (Feet): 60 Feet Assistive device: Rolling walker (2 wheeled) Gait Pattern/deviations: Decreased step length - right;Decreased step length - left;Decreased stride length Gait velocity: decreased   General Gait Details: demonstrates increased endurance/distance for ambulation without loss of balance with fair/good return for right heel to toe stepping, limited secondary to fatigue, on 3 LPM O2 with SpO2 dropping from 92% to 80%   Stairs             Wheelchair Mobility    Modified Rankin (Stroke Patients Only)       Balance Overall balance assessment: Needs assistance Sitting-balance support: Feet supported;No upper extremity supported Sitting balance-Leahy Scale: Good Sitting balance - Comments: seated EOB   Standing balance support: During functional activity;Bilateral upper extremity supported Standing balance-Leahy Scale: Fair Standing balance comment: using RW                            Cognition Arousal/Alertness: Awake/alert Behavior During Therapy: WFL for tasks assessed/performed Overall Cognitive Status: Within Functional Limits for tasks assessed                                        Exercises Total Joint Exercises Ankle Circles/Pumps: Supine;15 reps;Both;Strengthening;AROM Quad Sets: Supine;10 reps;Right;Strengthening;AROM Gluteal Sets: Supine;10 reps;Both;Strengthening;AROM Short Arc Quad: Supine;10 reps;Right;Strengthening;AROM Heel  Slides: Supine;10 reps;Right;Strengthening;AROM    General Comments        Pertinent Vitals/Pain Pain Score: 6  Pain Location: R hip Pain Descriptors / Indicators: Sore Pain Intervention(s):  Limited activity within patient's tolerance;Monitored during session    Home Living                      Prior Function            PT Goals (current goals can now be found in the care plan section) Acute Rehab PT Goals Patient Stated Goal: return home with family to assist PT Goal Formulation: With patient Time For Goal Achievement: 01/11/20 Potential to Achieve Goals: Good Progress towards PT goals: Progressing toward goals    Frequency    Min 5X/week      PT Plan Current plan remains appropriate    Co-evaluation              AM-PAC PT "6 Clicks" Mobility   Outcome Measure  Help needed turning from your back to your side while in a flat bed without using bedrails?: A Little Help needed moving from lying on your back to sitting on the side of a flat bed without using bedrails?: A Little Help needed moving to and from a bed to a chair (including a wheelchair)?: A Little Help needed standing up from a chair using your arms (e.g., wheelchair or bedside chair)?: A Little Help needed to walk in hospital room?: A Little Help needed climbing 3-5 steps with a railing? : A Lot 6 Click Score: 17    End of Session Equipment Utilized During Treatment: Oxygen Activity Tolerance: Patient tolerated treatment well;Patient limited by fatigue Patient left: in bed;with call bell/phone within reach;with bed alarm set Nurse Communication: Mobility status PT Visit Diagnosis: Unsteadiness on feet (R26.81);Other abnormalities of gait and mobility (R26.89);Muscle weakness (generalized) (M62.81);Pain Pain - Right/Left: Right Pain - part of body: Hip     Time: 1431-1458 PT Time Calculation (min) (ACUTE ONLY): 27 min  Charges:  $Gait Training: 8-22 mins $Therapeutic Exercise: 8-22 mins                     3:37 PM, 01/05/20 Ocie Bob, MPT Physical Therapist with [facility] [phone] office [phone] mobile phone

## 2020-01-05 NOTE — TOC Transition Note (Signed)
Transition of Care [facility]) - CM/SW Discharge Note  Patient Details  Name: Denise Mendoza MRN: [id number] Date of Birth: August 30, 1931  Transition of Care [facility]) CM/SW Contact:  Ewing Schlein, LCSW Phone Number: 01/05/2020, 4:54 PM  Clinical Narrative: Patient ready to be discharged. Home O2 saturation note in, so CSW spoke with [facility] to notify her the qualifying note is in for patient's O2 to be delivered. CSW updated Tim with [facility] that patient is discharging. TOC signing off.  Final next level of care: Home w Home Health Services Barriers to Discharge: Barriers Resolved  Patient Goals and CMS Choice Patient states their goals for this hospitalization and ongoing recovery are:: Discharge home CMS Medicare.gov Compare Post Acute Care list provided to:: Patient Choice offered to / list presented to : Adult Children  Discharge Plan and Services In-house Referral: Clinical Social Work Discharge Planning Services: NA Post Acute Care Choice: Home Health          DME Arranged: Walker rolling, Oxygen DME Agency: [employer] Date DME Agency Contacted: 01/05/20 Time DME Agency Contacted: [phone] Representative spoke with at DME Agency: Leeroy Bock HH Arranged: PT HH Agency: [facility] Date HH Agency Contacted: 01/05/20 Time HH Agency Contacted: 1418 Representative spoke with at [facility] Agency: Tim  Readmission Risk Interventions No flowsheet data found.

## 2020-01-06 LAB — CBC
HCT: 37.3 % (ref 36.0–46.0)
Hemoglobin: 11.4 g/dL — ABNORMAL LOW (ref 12.0–15.0)
MCH: 29.7 pg (ref 26.0–34.0)
MCHC: 30.6 g/dL (ref 30.0–36.0)
MCV: 97.1 fL (ref 80.0–100.0)
Platelets: 95 10*3/uL — ABNORMAL LOW (ref 150–400)
RBC: 3.84 MIL/uL — ABNORMAL LOW (ref 3.87–5.11)
RDW: 15.7 % — ABNORMAL HIGH (ref 11.5–15.5)
WBC: 8.8 10*3/uL (ref 4.0–10.5)
nRBC: 0 % (ref 0.0–0.2)

## 2020-01-06 NOTE — Discharge Planning (Signed)
Patient IV x2 and tele removed.  DC papers given, explained and educated to patient's son on arrival.  Informed of scripts sent to pharm, sent with O2 tanks x3 and tubing, HH set up and confirmed by CM, FU appts made and informed.  Ortho - 8/16 1140am [place] and PCP Vyas 8/13 245pm.  Will be wheeled to front and family transporting home via car.

## 2020-01-08 DIAGNOSIS — H548 Legal blindness, as defined in USA: Secondary | ICD-10-CM | POA: Diagnosis not present

## 2020-01-08 DIAGNOSIS — M5116 Intervertebral disc disorders with radiculopathy, lumbar region: Secondary | ICD-10-CM | POA: Diagnosis not present

## 2020-01-08 DIAGNOSIS — J9601 Acute respiratory failure with hypoxia: Secondary | ICD-10-CM | POA: Diagnosis not present

## 2020-01-08 DIAGNOSIS — F17210 Nicotine dependence, cigarettes, uncomplicated: Secondary | ICD-10-CM | POA: Diagnosis not present

## 2020-01-08 DIAGNOSIS — S72011D Unspecified intracapsular fracture of right femur, subsequent encounter for closed fracture with routine healing: Secondary | ICD-10-CM | POA: Diagnosis not present

## 2020-01-08 DIAGNOSIS — M4726 Other spondylosis with radiculopathy, lumbar region: Secondary | ICD-10-CM | POA: Diagnosis not present

## 2020-01-08 DIAGNOSIS — E785 Hyperlipidemia, unspecified: Secondary | ICD-10-CM | POA: Diagnosis not present

## 2020-01-08 DIAGNOSIS — I739 Peripheral vascular disease, unspecified: Secondary | ICD-10-CM | POA: Diagnosis not present

## 2020-01-08 DIAGNOSIS — K5900 Constipation, unspecified: Secondary | ICD-10-CM | POA: Diagnosis not present

## 2020-01-08 DIAGNOSIS — J439 Emphysema, unspecified: Secondary | ICD-10-CM | POA: Diagnosis not present

## 2020-01-08 DIAGNOSIS — I10 Essential (primary) hypertension: Secondary | ICD-10-CM | POA: Diagnosis not present

## 2020-01-08 DIAGNOSIS — Z79899 Other long term (current) drug therapy: Secondary | ICD-10-CM | POA: Diagnosis not present

## 2020-01-08 DIAGNOSIS — Z9981 Dependence on supplemental oxygen: Secondary | ICD-10-CM | POA: Diagnosis not present

## 2020-01-08 DIAGNOSIS — N3000 Acute cystitis without hematuria: Secondary | ICD-10-CM | POA: Diagnosis not present

## 2020-01-08 DIAGNOSIS — E538 Deficiency of other specified B group vitamins: Secondary | ICD-10-CM | POA: Diagnosis not present

## 2020-01-08 DIAGNOSIS — H3530 Unspecified macular degeneration: Secondary | ICD-10-CM | POA: Diagnosis not present

## 2020-01-08 DIAGNOSIS — Z9181 History of falling: Secondary | ICD-10-CM | POA: Diagnosis not present

## 2020-01-10 ENCOUNTER — Telehealth: Payer: Self-pay | Admitting: Orthopedic Surgery

## 2020-01-10 ENCOUNTER — Other Ambulatory Visit: Payer: Self-pay

## 2020-01-10 NOTE — Telephone Encounter (Signed)
Call received from [employer], [facility], for orders as follows, from ph# [phone]: -Physical therapy 1 time a week for 1 week  then 2 times a week for 3 weeks and 1 time a week for 5 weeks   In addition, asking for orders for occupational therapy, and for social worker to come in. Also states patient has not yet gotten the 2 wheel rolling walker or the 3 in 1 commode, ordered at time of discharge from [facility]. Please advise.

## 2020-01-10 NOTE — Patient Outreach (Signed)
[facility]) Care Management  01/10/2020  Denise Mendoza 1931/06/12 [id number]   New referral for red emmi: Date of red emmi:  01/08/2020 Reason for emmi alert:  Knows who to call about changes in condition--no  Placed call to patient and reviewed reason for call. Reviewed red emmi alert. Patient reports she would call her sister sine she is legally blind and her sister would call her primary MD.  Confirmed with patient that she knew how to reach her primary MD and she answered yes.  PLAN: case closure with no needs.  [name], RN, BSN, CEN [facility] [phone]

## 2020-01-10 NOTE — Telephone Encounter (Signed)
I called to give verbal orders for PT OT  And social worker  Advised family has orders for the DME and they can take orders to [employer].

## 2020-01-11 ENCOUNTER — Telehealth: Payer: Self-pay | Admitting: Orthopedic Surgery

## 2020-01-11 DIAGNOSIS — E785 Hyperlipidemia, unspecified: Secondary | ICD-10-CM | POA: Diagnosis not present

## 2020-01-11 DIAGNOSIS — S72001A Fracture of unspecified part of neck of right femur, initial encounter for closed fracture: Secondary | ICD-10-CM

## 2020-01-11 DIAGNOSIS — K5900 Constipation, unspecified: Secondary | ICD-10-CM | POA: Diagnosis not present

## 2020-01-11 DIAGNOSIS — H3530 Unspecified macular degeneration: Secondary | ICD-10-CM | POA: Diagnosis not present

## 2020-01-11 DIAGNOSIS — I739 Peripheral vascular disease, unspecified: Secondary | ICD-10-CM | POA: Diagnosis not present

## 2020-01-11 DIAGNOSIS — I10 Essential (primary) hypertension: Secondary | ICD-10-CM | POA: Diagnosis not present

## 2020-01-11 DIAGNOSIS — S72011D Unspecified intracapsular fracture of right femur, subsequent encounter for closed fracture with routine healing: Secondary | ICD-10-CM | POA: Diagnosis not present

## 2020-01-11 NOTE — Telephone Encounter (Signed)
Faxed

## 2020-01-11 NOTE — Telephone Encounter (Signed)
[name], PT from [facility] called and stated that pt's family cannot not find any prescriptions for this patient.  He is requesting new prescriptions be sent to [facility] for front wheel walker and for 3 in 1 bedside commode.    If any questions, please call Tom at [phone]  Thanks so much

## 2020-01-11 NOTE — Telephone Encounter (Signed)
Prepared order for signature/ will fax when signed

## 2020-01-12 ENCOUNTER — Other Ambulatory Visit: Payer: Self-pay

## 2020-01-12 NOTE — Patient Outreach (Signed)
[facility]) Care Management  01/12/2020  Denise Mendoza 1932/03/18 [id number]   Red emmi:  Date of call: 01/12/2020 Reason for alert:  Lost interest in doing things---yes Sad, hopeless, anxious/empty----yes  Placed call to patient who answered the phone. Reviewed reason for call. 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  Reports she has felt depressed for a long time. Reports she just continues to remind herself that she has a good sister who takes care of her. Reports she has restless leg syndrome and she is often up at night walking . Reports she use to be on Xanax but is out.  Patient request that I speak with her sister that lives next door and is with her right now. Spoke with sister who states she answered the questions for patient. Reports patient will see primary MD on Friday and sister will discuss depression with MD.  Sister and patient denies any other concerns and will address with MD in 2 days.  PLAN: case closed.  [name], RN, BSN, CEN [facility] [employer] [phone]

## 2020-01-13 ENCOUNTER — Telehealth: Payer: Self-pay | Admitting: Orthopedic Surgery

## 2020-01-13 DIAGNOSIS — S72011D Unspecified intracapsular fracture of right femur, subsequent encounter for closed fracture with routine healing: Secondary | ICD-10-CM | POA: Diagnosis not present

## 2020-01-13 DIAGNOSIS — I10 Essential (primary) hypertension: Secondary | ICD-10-CM | POA: Diagnosis not present

## 2020-01-13 DIAGNOSIS — H3530 Unspecified macular degeneration: Secondary | ICD-10-CM | POA: Diagnosis not present

## 2020-01-13 DIAGNOSIS — I739 Peripheral vascular disease, unspecified: Secondary | ICD-10-CM | POA: Diagnosis not present

## 2020-01-13 DIAGNOSIS — K5900 Constipation, unspecified: Secondary | ICD-10-CM | POA: Diagnosis not present

## 2020-01-13 DIAGNOSIS — E785 Hyperlipidemia, unspecified: Secondary | ICD-10-CM | POA: Diagnosis not present

## 2020-01-13 NOTE — Telephone Encounter (Signed)
Voice message received from [facility], [facility], [phone], following back up on orders for physical therapy, occupational therapy, and for social worker, and also for meals on wheels.  Please advise.

## 2020-01-13 NOTE — Telephone Encounter (Signed)
I spoke to her on 01/10/20 she needed additional orders for meals on wheels and assisted living placement recommendation

## 2020-01-17 ENCOUNTER — Encounter: Payer: Self-pay | Admitting: Orthopedic Surgery

## 2020-01-17 ENCOUNTER — Other Ambulatory Visit: Payer: Self-pay

## 2020-01-17 ENCOUNTER — Ambulatory Visit (INDEPENDENT_AMBULATORY_CARE_PROVIDER_SITE_OTHER): Payer: Medicare Other | Admitting: Orthopedic Surgery

## 2020-01-17 ENCOUNTER — Ambulatory Visit: Payer: Medicare Other

## 2020-01-17 DIAGNOSIS — M545 Low back pain, unspecified: Secondary | ICD-10-CM

## 2020-01-17 DIAGNOSIS — G8929 Other chronic pain: Secondary | ICD-10-CM

## 2020-01-17 DIAGNOSIS — S72001D Fracture of unspecified part of neck of right femur, subsequent encounter for closed fracture with routine healing: Secondary | ICD-10-CM

## 2020-01-17 DIAGNOSIS — S72001A Fracture of unspecified part of neck of right femur, initial encounter for closed fracture: Secondary | ICD-10-CM

## 2020-01-17 MED ORDER — HYDROCODONE-ACETAMINOPHEN 5-325 MG PO TABS: 1.0000 | ORAL_TABLET | Freq: Four times a day (QID) | ORAL | 0 refills | Status: DC | PRN

## 2020-01-17 MED ORDER — IBUPROFEN 800 MG PO TABS: 800.0000 mg | ORAL_TABLET | Freq: Three times a day (TID) | ORAL | 1 refills | Status: DC | PRN

## 2020-01-17 NOTE — Progress Notes (Signed)
POST OP   Encounter Diagnoses  Name Primary?  . Closed displaced fracture of right femoral neck (HCC) s/p surgery 01/03/20 cannulated screws Yes  . Closed displaced fracture of right femoral neck with routine healing   . Chronic right-sided low back pain without sciatica     C/O BACK PAIN (RT BUTTOCKS)  SOME GROIN PAIN   INCX STAPLES OUT WOUND LOOKS GOOD   X-RAYS: SCREWS ARE INTACT;    XRAYS IN 8 WEEKS   Meds ordered this encounter  Medications  . HYDROcodone-acetaminophen (NORCO/VICODIN) 5-325 MG tablet    Sig: Take 1 tablet by mouth every 6 (six) hours as needed for moderate pain.    Dispense:  30 tablet    Refill:  0  . ibuprofen (ADVIL) 800 MG tablet    Sig: Take 1 tablet (800 mg total) by mouth every 8 (eight) hours as needed.    Dispense:  90 tablet    Refill:  1

## 2020-01-18 DIAGNOSIS — I739 Peripheral vascular disease, unspecified: Secondary | ICD-10-CM | POA: Diagnosis not present

## 2020-01-18 DIAGNOSIS — K5900 Constipation, unspecified: Secondary | ICD-10-CM | POA: Diagnosis not present

## 2020-01-18 DIAGNOSIS — H3530 Unspecified macular degeneration: Secondary | ICD-10-CM | POA: Diagnosis not present

## 2020-01-18 DIAGNOSIS — E785 Hyperlipidemia, unspecified: Secondary | ICD-10-CM | POA: Diagnosis not present

## 2020-01-18 DIAGNOSIS — I10 Essential (primary) hypertension: Secondary | ICD-10-CM | POA: Diagnosis not present

## 2020-01-18 DIAGNOSIS — S72011D Unspecified intracapsular fracture of right femur, subsequent encounter for closed fracture with routine healing: Secondary | ICD-10-CM | POA: Diagnosis not present

## 2020-01-19 DIAGNOSIS — H3530 Unspecified macular degeneration: Secondary | ICD-10-CM | POA: Diagnosis not present

## 2020-01-19 DIAGNOSIS — I10 Essential (primary) hypertension: Secondary | ICD-10-CM | POA: Diagnosis not present

## 2020-01-19 DIAGNOSIS — S72011D Unspecified intracapsular fracture of right femur, subsequent encounter for closed fracture with routine healing: Secondary | ICD-10-CM | POA: Diagnosis not present

## 2020-01-19 DIAGNOSIS — I739 Peripheral vascular disease, unspecified: Secondary | ICD-10-CM | POA: Diagnosis not present

## 2020-01-19 DIAGNOSIS — K5900 Constipation, unspecified: Secondary | ICD-10-CM | POA: Diagnosis not present

## 2020-01-19 DIAGNOSIS — E785 Hyperlipidemia, unspecified: Secondary | ICD-10-CM | POA: Diagnosis not present

## 2020-01-21 DIAGNOSIS — K5900 Constipation, unspecified: Secondary | ICD-10-CM | POA: Diagnosis not present

## 2020-01-21 DIAGNOSIS — I10 Essential (primary) hypertension: Secondary | ICD-10-CM | POA: Diagnosis not present

## 2020-01-21 DIAGNOSIS — H3530 Unspecified macular degeneration: Secondary | ICD-10-CM | POA: Diagnosis not present

## 2020-01-21 DIAGNOSIS — E785 Hyperlipidemia, unspecified: Secondary | ICD-10-CM | POA: Diagnosis not present

## 2020-01-21 DIAGNOSIS — S72011D Unspecified intracapsular fracture of right femur, subsequent encounter for closed fracture with routine healing: Secondary | ICD-10-CM | POA: Diagnosis not present

## 2020-01-21 DIAGNOSIS — I739 Peripheral vascular disease, unspecified: Secondary | ICD-10-CM | POA: Diagnosis not present

## 2020-01-25 DIAGNOSIS — I739 Peripheral vascular disease, unspecified: Secondary | ICD-10-CM | POA: Diagnosis not present

## 2020-01-25 DIAGNOSIS — K5900 Constipation, unspecified: Secondary | ICD-10-CM | POA: Diagnosis not present

## 2020-01-25 DIAGNOSIS — I10 Essential (primary) hypertension: Secondary | ICD-10-CM | POA: Diagnosis not present

## 2020-01-25 DIAGNOSIS — E785 Hyperlipidemia, unspecified: Secondary | ICD-10-CM | POA: Diagnosis not present

## 2020-01-25 DIAGNOSIS — S72011D Unspecified intracapsular fracture of right femur, subsequent encounter for closed fracture with routine healing: Secondary | ICD-10-CM | POA: Diagnosis not present

## 2020-01-25 DIAGNOSIS — H3530 Unspecified macular degeneration: Secondary | ICD-10-CM | POA: Diagnosis not present

## 2020-01-27 DIAGNOSIS — E785 Hyperlipidemia, unspecified: Secondary | ICD-10-CM | POA: Diagnosis not present

## 2020-01-27 DIAGNOSIS — S72011D Unspecified intracapsular fracture of right femur, subsequent encounter for closed fracture with routine healing: Secondary | ICD-10-CM | POA: Diagnosis not present

## 2020-01-27 DIAGNOSIS — I739 Peripheral vascular disease, unspecified: Secondary | ICD-10-CM | POA: Diagnosis not present

## 2020-01-27 DIAGNOSIS — K5900 Constipation, unspecified: Secondary | ICD-10-CM | POA: Diagnosis not present

## 2020-01-27 DIAGNOSIS — H3530 Unspecified macular degeneration: Secondary | ICD-10-CM | POA: Diagnosis not present

## 2020-01-27 DIAGNOSIS — I10 Essential (primary) hypertension: Secondary | ICD-10-CM | POA: Diagnosis not present

## 2020-02-01 DIAGNOSIS — E785 Hyperlipidemia, unspecified: Secondary | ICD-10-CM | POA: Diagnosis not present

## 2020-02-01 DIAGNOSIS — I10 Essential (primary) hypertension: Secondary | ICD-10-CM | POA: Diagnosis not present

## 2020-02-01 DIAGNOSIS — H3530 Unspecified macular degeneration: Secondary | ICD-10-CM | POA: Diagnosis not present

## 2020-02-01 DIAGNOSIS — I739 Peripheral vascular disease, unspecified: Secondary | ICD-10-CM | POA: Diagnosis not present

## 2020-02-01 DIAGNOSIS — K5900 Constipation, unspecified: Secondary | ICD-10-CM | POA: Diagnosis not present

## 2020-02-01 DIAGNOSIS — S72011D Unspecified intracapsular fracture of right femur, subsequent encounter for closed fracture with routine healing: Secondary | ICD-10-CM | POA: Diagnosis not present

## 2020-02-02 DIAGNOSIS — H3530 Unspecified macular degeneration: Secondary | ICD-10-CM | POA: Diagnosis not present

## 2020-02-02 DIAGNOSIS — I739 Peripheral vascular disease, unspecified: Secondary | ICD-10-CM | POA: Diagnosis not present

## 2020-02-02 DIAGNOSIS — K5900 Constipation, unspecified: Secondary | ICD-10-CM | POA: Diagnosis not present

## 2020-02-02 DIAGNOSIS — S72011D Unspecified intracapsular fracture of right femur, subsequent encounter for closed fracture with routine healing: Secondary | ICD-10-CM | POA: Diagnosis not present

## 2020-02-02 DIAGNOSIS — E785 Hyperlipidemia, unspecified: Secondary | ICD-10-CM | POA: Diagnosis not present

## 2020-02-02 DIAGNOSIS — I10 Essential (primary) hypertension: Secondary | ICD-10-CM | POA: Diagnosis not present

## 2020-02-07 DIAGNOSIS — F17210 Nicotine dependence, cigarettes, uncomplicated: Secondary | ICD-10-CM | POA: Diagnosis not present

## 2020-02-07 DIAGNOSIS — E538 Deficiency of other specified B group vitamins: Secondary | ICD-10-CM | POA: Diagnosis not present

## 2020-02-07 DIAGNOSIS — K5900 Constipation, unspecified: Secondary | ICD-10-CM | POA: Diagnosis not present

## 2020-02-07 DIAGNOSIS — E785 Hyperlipidemia, unspecified: Secondary | ICD-10-CM | POA: Diagnosis not present

## 2020-02-07 DIAGNOSIS — M4726 Other spondylosis with radiculopathy, lumbar region: Secondary | ICD-10-CM | POA: Diagnosis not present

## 2020-02-07 DIAGNOSIS — M5116 Intervertebral disc disorders with radiculopathy, lumbar region: Secondary | ICD-10-CM | POA: Diagnosis not present

## 2020-02-07 DIAGNOSIS — S72011D Unspecified intracapsular fracture of right femur, subsequent encounter for closed fracture with routine healing: Secondary | ICD-10-CM | POA: Diagnosis not present

## 2020-02-07 DIAGNOSIS — Z9181 History of falling: Secondary | ICD-10-CM | POA: Diagnosis not present

## 2020-02-07 DIAGNOSIS — J9601 Acute respiratory failure with hypoxia: Secondary | ICD-10-CM | POA: Diagnosis not present

## 2020-02-07 DIAGNOSIS — I10 Essential (primary) hypertension: Secondary | ICD-10-CM | POA: Diagnosis not present

## 2020-02-07 DIAGNOSIS — Z9981 Dependence on supplemental oxygen: Secondary | ICD-10-CM | POA: Diagnosis not present

## 2020-02-07 DIAGNOSIS — J439 Emphysema, unspecified: Secondary | ICD-10-CM | POA: Diagnosis not present

## 2020-02-07 DIAGNOSIS — H3530 Unspecified macular degeneration: Secondary | ICD-10-CM | POA: Diagnosis not present

## 2020-02-07 DIAGNOSIS — Z79899 Other long term (current) drug therapy: Secondary | ICD-10-CM | POA: Diagnosis not present

## 2020-02-07 DIAGNOSIS — I739 Peripheral vascular disease, unspecified: Secondary | ICD-10-CM | POA: Diagnosis not present

## 2020-02-07 DIAGNOSIS — H548 Legal blindness, as defined in USA: Secondary | ICD-10-CM | POA: Diagnosis not present

## 2020-02-07 DIAGNOSIS — N3000 Acute cystitis without hematuria: Secondary | ICD-10-CM | POA: Diagnosis not present

## 2020-02-08 DIAGNOSIS — I10 Essential (primary) hypertension: Secondary | ICD-10-CM | POA: Diagnosis not present

## 2020-02-08 DIAGNOSIS — H3530 Unspecified macular degeneration: Secondary | ICD-10-CM | POA: Diagnosis not present

## 2020-02-08 DIAGNOSIS — I739 Peripheral vascular disease, unspecified: Secondary | ICD-10-CM | POA: Diagnosis not present

## 2020-02-08 DIAGNOSIS — E785 Hyperlipidemia, unspecified: Secondary | ICD-10-CM | POA: Diagnosis not present

## 2020-02-08 DIAGNOSIS — S72011D Unspecified intracapsular fracture of right femur, subsequent encounter for closed fracture with routine healing: Secondary | ICD-10-CM | POA: Diagnosis not present

## 2020-02-08 DIAGNOSIS — K5900 Constipation, unspecified: Secondary | ICD-10-CM | POA: Diagnosis not present

## 2020-03-02 DIAGNOSIS — M069 Rheumatoid arthritis, unspecified: Secondary | ICD-10-CM | POA: Diagnosis not present

## 2020-03-02 DIAGNOSIS — I10 Essential (primary) hypertension: Secondary | ICD-10-CM | POA: Diagnosis not present

## 2020-03-02 DIAGNOSIS — E7800 Pure hypercholesterolemia, unspecified: Secondary | ICD-10-CM | POA: Diagnosis not present

## 2020-03-13 ENCOUNTER — Ambulatory Visit: Payer: Medicare Other

## 2020-03-13 ENCOUNTER — Encounter: Payer: Self-pay | Admitting: Orthopedic Surgery

## 2020-03-13 ENCOUNTER — Ambulatory Visit (INDEPENDENT_AMBULATORY_CARE_PROVIDER_SITE_OTHER): Payer: Medicare Other | Admitting: Orthopedic Surgery

## 2020-03-13 ENCOUNTER — Other Ambulatory Visit: Payer: Self-pay

## 2020-03-13 VITALS — Wt 106.0 lb

## 2020-03-13 DIAGNOSIS — S72001D Fracture of unspecified part of neck of right femur, subsequent encounter for closed fracture with routine healing: Secondary | ICD-10-CM

## 2020-03-13 DIAGNOSIS — M5410 Radiculopathy, site unspecified: Secondary | ICD-10-CM

## 2020-03-13 MED ORDER — PREDNISONE 10 MG (48) PO TBPK: ORAL_TABLET | Freq: Every day | ORAL | 0 refills | Status: DC

## 2020-03-13 MED ORDER — AMITRIPTYLINE HCL 10 MG PO TABS: 10.0000 mg | ORAL_TABLET | Freq: Every day | ORAL | 2 refills | Status: DC

## 2020-03-13 NOTE — Patient Instructions (Signed)
Meds ordered this encounter  Medications  . predniSONE (STERAPRED UNI-PAK 48 TAB) 10 MG (48) TBPK tablet    Sig: Take by mouth daily.    Dispense:  48 tablet    Refill:  0  . amitriptyline (ELAVIL) 10 MG tablet    Sig: Take 1 tablet (10 mg total) by mouth at bedtime.    Dispense:  60 tablet    Refill:  2

## 2020-03-13 NOTE — Progress Notes (Signed)
Chief Complaint  Patient presents with  . Routine Post Op    01/03/20 right hip cannulated screw placement    Pod # [id number] (wk 7)  Right leg pain radiating buttocks to the right knee  Pain over right hip incision  Wound is clean dry and intact no erythema leg lengths are equal x-ray shows fracture is healing screws are intact  I had a long talk with her regarding her opioid history with hydrocodone which she says did help her in the past but at this point it is not recommended after the surgery this far out  Try prednisone for the radicular pain try tramadol for the hip pain follow-up in 5 weeks for x-ray   Encounter Diagnoses  Name Primary?  . Closed displaced fracture of right femoral neck with routine healing 01/03/20 cannulated screw placement    . Radicular leg pain Yes   Encounter Diagnoses  Name Primary?  . Closed displaced fracture of right femoral neck with routine healing 01/03/20 cannulated screw placement    . Radicular leg pain Yes    Meds ordered this encounter  Medications  . predniSONE (STERAPRED UNI-PAK 48 TAB) 10 MG (48) TBPK tablet    Sig: Take by mouth daily.    Dispense:  48 tablet    Refill:  0  . amitriptyline (ELAVIL) 10 MG tablet    Sig: Take 1 tablet (10 mg total) by mouth at bedtime.    Dispense:  60 tablet    Refill:  2

## 2020-03-31 DIAGNOSIS — I10 Essential (primary) hypertension: Secondary | ICD-10-CM | POA: Diagnosis not present

## 2020-03-31 DIAGNOSIS — E7800 Pure hypercholesterolemia, unspecified: Secondary | ICD-10-CM | POA: Diagnosis not present

## 2020-03-31 DIAGNOSIS — M069 Rheumatoid arthritis, unspecified: Secondary | ICD-10-CM | POA: Diagnosis not present

## 2020-04-11 ENCOUNTER — Encounter: Payer: Self-pay | Admitting: Orthopedic Surgery

## 2020-04-11 ENCOUNTER — Telehealth: Payer: Self-pay | Admitting: Orthopedic Surgery

## 2020-04-11 NOTE — Telephone Encounter (Signed)
Noted; called patient to relay. Voiced understanding.

## 2020-04-11 NOTE — Telephone Encounter (Signed)
Ok  Tell her if she has any severe pain or something feels off call us and be seen here or go to local er to have xrays and they can send them to [place] or call  us if between 9-5

## 2020-04-11 NOTE — Telephone Encounter (Signed)
Patient requests to hold on upcoming follow up appointment with Dr Romeo Apple on 04/17/20 for Xrays of right hip, post surgery 01/03/20. [place]."  States she does not know when she will be back at home and prefers to call back to reschedule; said doing okay with hip. Aware we are sending a reminder letter to her home for when she returns.

## 2020-04-17 ENCOUNTER — Ambulatory Visit: Payer: BLUE CROSS/BLUE SHIELD | Admitting: Orthopedic Surgery

## 2020-05-02 DIAGNOSIS — I10 Essential (primary) hypertension: Secondary | ICD-10-CM | POA: Diagnosis not present

## 2020-05-02 DIAGNOSIS — E7800 Pure hypercholesterolemia, unspecified: Secondary | ICD-10-CM | POA: Diagnosis not present

## 2020-05-02 DIAGNOSIS — M069 Rheumatoid arthritis, unspecified: Secondary | ICD-10-CM | POA: Diagnosis not present

## 2020-05-22 DIAGNOSIS — Z299 Encounter for prophylactic measures, unspecified: Secondary | ICD-10-CM | POA: Diagnosis not present

## 2020-05-22 DIAGNOSIS — F419 Anxiety disorder, unspecified: Secondary | ICD-10-CM | POA: Diagnosis not present

## 2020-05-22 DIAGNOSIS — M810 Age-related osteoporosis without current pathological fracture: Secondary | ICD-10-CM | POA: Diagnosis not present

## 2020-05-22 DIAGNOSIS — I712 Thoracic aortic aneurysm, without rupture: Secondary | ICD-10-CM | POA: Diagnosis not present

## 2020-05-22 DIAGNOSIS — I10 Essential (primary) hypertension: Secondary | ICD-10-CM | POA: Diagnosis not present

## 2020-05-22 DIAGNOSIS — M1990 Unspecified osteoarthritis, unspecified site: Secondary | ICD-10-CM | POA: Diagnosis not present

## 2020-06-01 DIAGNOSIS — M069 Rheumatoid arthritis, unspecified: Secondary | ICD-10-CM | POA: Diagnosis not present

## 2020-06-01 DIAGNOSIS — E7800 Pure hypercholesterolemia, unspecified: Secondary | ICD-10-CM | POA: Diagnosis not present

## 2020-06-01 DIAGNOSIS — I10 Essential (primary) hypertension: Secondary | ICD-10-CM | POA: Diagnosis not present

## 2020-07-24 DIAGNOSIS — E538 Deficiency of other specified B group vitamins: Secondary | ICD-10-CM | POA: Diagnosis not present

## 2020-07-24 DIAGNOSIS — R5383 Other fatigue: Secondary | ICD-10-CM | POA: Diagnosis not present

## 2020-07-24 DIAGNOSIS — Z79899 Other long term (current) drug therapy: Secondary | ICD-10-CM | POA: Diagnosis not present

## 2020-09-25 DIAGNOSIS — Z299 Encounter for prophylactic measures, unspecified: Secondary | ICD-10-CM | POA: Diagnosis not present

## 2020-09-25 DIAGNOSIS — M79605 Pain in left leg: Secondary | ICD-10-CM | POA: Diagnosis not present

## 2020-09-25 DIAGNOSIS — M79604 Pain in right leg: Secondary | ICD-10-CM | POA: Diagnosis not present

## 2020-09-25 DIAGNOSIS — I10 Essential (primary) hypertension: Secondary | ICD-10-CM | POA: Diagnosis not present

## 2020-09-25 DIAGNOSIS — M545 Low back pain, unspecified: Secondary | ICD-10-CM | POA: Diagnosis not present

## 2020-09-25 DIAGNOSIS — F17210 Nicotine dependence, cigarettes, uncomplicated: Secondary | ICD-10-CM | POA: Diagnosis not present

## 2020-09-25 DIAGNOSIS — F419 Anxiety disorder, unspecified: Secondary | ICD-10-CM | POA: Diagnosis not present

## 2020-10-03 DIAGNOSIS — J069 Acute upper respiratory infection, unspecified: Secondary | ICD-10-CM | POA: Diagnosis not present

## 2020-10-03 DIAGNOSIS — F17210 Nicotine dependence, cigarettes, uncomplicated: Secondary | ICD-10-CM | POA: Diagnosis not present

## 2020-10-03 DIAGNOSIS — Z299 Encounter for prophylactic measures, unspecified: Secondary | ICD-10-CM | POA: Diagnosis not present

## 2020-10-03 DIAGNOSIS — M542 Cervicalgia: Secondary | ICD-10-CM | POA: Diagnosis not present

## 2020-10-03 DIAGNOSIS — E7800 Pure hypercholesterolemia, unspecified: Secondary | ICD-10-CM | POA: Diagnosis not present

## 2020-12-21 DIAGNOSIS — G319 Degenerative disease of nervous system, unspecified: Secondary | ICD-10-CM | POA: Diagnosis not present

## 2020-12-21 DIAGNOSIS — F339 Major depressive disorder, recurrent, unspecified: Secondary | ICD-10-CM | POA: Diagnosis not present

## 2020-12-21 DIAGNOSIS — Z1331 Encounter for screening for depression: Secondary | ICD-10-CM | POA: Diagnosis not present

## 2020-12-21 DIAGNOSIS — Z7189 Other specified counseling: Secondary | ICD-10-CM | POA: Diagnosis not present

## 2020-12-21 DIAGNOSIS — R5383 Other fatigue: Secondary | ICD-10-CM | POA: Diagnosis not present

## 2020-12-21 DIAGNOSIS — Z1339 Encounter for screening examination for other mental health and behavioral disorders: Secondary | ICD-10-CM | POA: Diagnosis not present

## 2020-12-21 DIAGNOSIS — E7800 Pure hypercholesterolemia, unspecified: Secondary | ICD-10-CM | POA: Diagnosis not present

## 2020-12-21 DIAGNOSIS — Z681 Body mass index (BMI) 19 or less, adult: Secondary | ICD-10-CM | POA: Diagnosis not present

## 2020-12-21 DIAGNOSIS — Z79899 Other long term (current) drug therapy: Secondary | ICD-10-CM | POA: Diagnosis not present

## 2020-12-21 DIAGNOSIS — M545 Low back pain, unspecified: Secondary | ICD-10-CM | POA: Diagnosis not present

## 2020-12-21 DIAGNOSIS — Z0000 Encounter for general adult medical examination without abnormal findings: Secondary | ICD-10-CM | POA: Diagnosis not present

## 2020-12-21 DIAGNOSIS — I10 Essential (primary) hypertension: Secondary | ICD-10-CM | POA: Diagnosis not present

## 2020-12-21 DIAGNOSIS — G8929 Other chronic pain: Secondary | ICD-10-CM | POA: Diagnosis not present

## 2020-12-21 DIAGNOSIS — Z299 Encounter for prophylactic measures, unspecified: Secondary | ICD-10-CM | POA: Diagnosis not present

## 2021-01-04 ENCOUNTER — Other Ambulatory Visit: Payer: Self-pay | Admitting: Orthopedic Surgery

## 2021-01-04 DIAGNOSIS — M5410 Radiculopathy, site unspecified: Secondary | ICD-10-CM

## 2021-01-17 ENCOUNTER — Encounter (HOSPITAL_COMMUNITY): Payer: Self-pay | Admitting: Emergency Medicine

## 2021-01-17 ENCOUNTER — Inpatient Hospital Stay (HOSPITAL_COMMUNITY)
Admission: EM | Admit: 2021-01-17 | Discharge: 2021-01-20 | DRG: 177 | Disposition: A | Discharge status: Home Health | Payer: Medicare Other | Attending: Family Medicine | Admitting: Family Medicine

## 2021-01-17 ENCOUNTER — Other Ambulatory Visit: Payer: Self-pay

## 2021-01-17 ENCOUNTER — Emergency Department (HOSPITAL_COMMUNITY): Payer: Medicare Other

## 2021-01-17 DIAGNOSIS — R0902 Hypoxemia: Secondary | ICD-10-CM | POA: Diagnosis not present

## 2021-01-17 DIAGNOSIS — Z681 Body mass index (BMI) 19 or less, adult: Secondary | ICD-10-CM | POA: Diagnosis not present

## 2021-01-17 DIAGNOSIS — U071 COVID-19: Secondary | ICD-10-CM | POA: Diagnosis present

## 2021-01-17 DIAGNOSIS — J1282 Pneumonia due to coronavirus disease 2019: Secondary | ICD-10-CM | POA: Diagnosis present

## 2021-01-17 DIAGNOSIS — G8929 Other chronic pain: Secondary | ICD-10-CM | POA: Diagnosis present

## 2021-01-17 DIAGNOSIS — E785 Hyperlipidemia, unspecified: Secondary | ICD-10-CM | POA: Diagnosis not present

## 2021-01-17 DIAGNOSIS — J9691 Respiratory failure, unspecified with hypoxia: Secondary | ICD-10-CM | POA: Diagnosis present

## 2021-01-17 DIAGNOSIS — J439 Emphysema, unspecified: Secondary | ICD-10-CM | POA: Diagnosis present

## 2021-01-17 DIAGNOSIS — I739 Peripheral vascular disease, unspecified: Secondary | ICD-10-CM | POA: Diagnosis present

## 2021-01-17 DIAGNOSIS — F419 Anxiety disorder, unspecified: Secondary | ICD-10-CM | POA: Diagnosis present

## 2021-01-17 DIAGNOSIS — D72819 Decreased white blood cell count, unspecified: Secondary | ICD-10-CM | POA: Diagnosis not present

## 2021-01-17 DIAGNOSIS — Z8249 Family history of ischemic heart disease and other diseases of the circulatory system: Secondary | ICD-10-CM | POA: Diagnosis not present

## 2021-01-17 DIAGNOSIS — E876 Hypokalemia: Secondary | ICD-10-CM | POA: Diagnosis not present

## 2021-01-17 DIAGNOSIS — Z90710 Acquired absence of both cervix and uterus: Secondary | ICD-10-CM | POA: Diagnosis not present

## 2021-01-17 DIAGNOSIS — K219 Gastro-esophageal reflux disease without esophagitis: Secondary | ICD-10-CM | POA: Diagnosis not present

## 2021-01-17 DIAGNOSIS — R509 Fever, unspecified: Secondary | ICD-10-CM | POA: Diagnosis not present

## 2021-01-17 DIAGNOSIS — R0689 Other abnormalities of breathing: Secondary | ICD-10-CM | POA: Diagnosis not present

## 2021-01-17 DIAGNOSIS — M5410 Radiculopathy, site unspecified: Secondary | ICD-10-CM

## 2021-01-17 DIAGNOSIS — R636 Underweight: Secondary | ICD-10-CM | POA: Diagnosis not present

## 2021-01-17 DIAGNOSIS — F17210 Nicotine dependence, cigarettes, uncomplicated: Secondary | ICD-10-CM | POA: Diagnosis present

## 2021-01-17 DIAGNOSIS — R531 Weakness: Secondary | ICD-10-CM | POA: Diagnosis not present

## 2021-01-17 DIAGNOSIS — I959 Hypotension, unspecified: Secondary | ICD-10-CM | POA: Diagnosis not present

## 2021-01-17 DIAGNOSIS — J9601 Acute respiratory failure with hypoxia: Secondary | ICD-10-CM | POA: Diagnosis present

## 2021-01-17 DIAGNOSIS — Z28310 Unvaccinated for covid-19: Secondary | ICD-10-CM | POA: Diagnosis not present

## 2021-01-17 DIAGNOSIS — R5381 Other malaise: Secondary | ICD-10-CM | POA: Diagnosis present

## 2021-01-17 DIAGNOSIS — I10 Essential (primary) hypertension: Secondary | ICD-10-CM | POA: Diagnosis present

## 2021-01-17 LAB — RESP PANEL BY RT-PCR (FLU A&B, COVID) ARPGX2
Influenza A by PCR: NEGATIVE
Influenza B by PCR: NEGATIVE
SARS Coronavirus 2 by RT PCR: POSITIVE — AB

## 2021-01-17 LAB — CBC WITH DIFFERENTIAL/PLATELET
Abs Immature Granulocytes: 0.01 10*3/uL (ref 0.00–0.07)
Basophils Absolute: 0 10*3/uL (ref 0.0–0.1)
Basophils Relative: 0 %
Eosinophils Absolute: 0 10*3/uL (ref 0.0–0.5)
Eosinophils Relative: 0 %
HCT: 39.7 % (ref 36.0–46.0)
Hemoglobin: 12.1 g/dL (ref 12.0–15.0)
Immature Granulocytes: 0 %
Lymphocytes Relative: 28 %
Lymphs Abs: 0.7 10*3/uL (ref 0.7–4.0)
MCH: 30.1 pg (ref 26.0–34.0)
MCHC: 30.5 g/dL (ref 30.0–36.0)
MCV: 98.8 fL (ref 80.0–100.0)
Monocytes Absolute: 0.2 10*3/uL (ref 0.1–1.0)
Monocytes Relative: 8 %
Neutro Abs: 1.6 10*3/uL — ABNORMAL LOW (ref 1.7–7.7)
Neutrophils Relative %: 64 %
Platelets: 108 10*3/uL — ABNORMAL LOW (ref 150–400)
RBC: 4.02 MIL/uL (ref 3.87–5.11)
RDW: 15.2 % (ref 11.5–15.5)
WBC: 2.5 10*3/uL — ABNORMAL LOW (ref 4.0–10.5)
nRBC: 0 % (ref 0.0–0.2)

## 2021-01-17 LAB — COMPREHENSIVE METABOLIC PANEL
ALT: 9 U/L (ref 0–44)
AST: 26 U/L (ref 15–41)
Albumin: 3 g/dL — ABNORMAL LOW (ref 3.5–5.0)
Alkaline Phosphatase: 40 U/L (ref 38–126)
Anion gap: 8 (ref 5–15)
BUN: 15 mg/dL (ref 8–23)
CO2: 33 mmol/L — ABNORMAL HIGH (ref 22–32)
Calcium: 7.6 mg/dL — ABNORMAL LOW (ref 8.9–10.3)
Chloride: 100 mmol/L (ref 98–111)
Creatinine, Ser: 0.65 mg/dL (ref 0.44–1.00)
GFR, Estimated: >60 mL/min (ref >60)
Glucose, Bld: 97 mg/dL (ref 70–99)
Potassium: 2.9 mmol/L — ABNORMAL LOW (ref 3.5–5.1)
Sodium: 141 mmol/L (ref 135–145)
Total Bilirubin: 1 mg/dL (ref 0.3–1.2)
Total Protein: 6.1 g/dL — ABNORMAL LOW (ref 6.5–8.1)

## 2021-01-17 LAB — TROPONIN I (HIGH SENSITIVITY)
Troponin I (High Sensitivity): 25 ng/L — ABNORMAL HIGH (ref <18)
Troponin I (High Sensitivity): 22 ng/L — ABNORMAL HIGH (ref <18)

## 2021-01-17 LAB — FERRITIN: Ferritin: 459 ng/mL — ABNORMAL HIGH (ref 11–307)

## 2021-01-17 LAB — PROCALCITONIN: Procalcitonin: <0.1 ng/mL

## 2021-01-17 LAB — D-DIMER, QUANTITATIVE: D-Dimer, Quant: 2.7 ug/mL-FEU — ABNORMAL HIGH (ref 0.00–0.50)

## 2021-01-17 LAB — LACTIC ACID, PLASMA: Lactic Acid, Venous: 1 mmol/L (ref 0.5–1.9)

## 2021-01-17 LAB — C-REACTIVE PROTEIN: CRP: 10.4 mg/dL — ABNORMAL HIGH (ref <1.0)

## 2021-01-17 LAB — LACTATE DEHYDROGENASE: LDH: 236 U/L — ABNORMAL HIGH (ref 98–192)

## 2021-01-17 LAB — TRIGLYCERIDES: Triglycerides: 55 mg/dL (ref <150)

## 2021-01-17 LAB — FIBRINOGEN: Fibrinogen: 485 mg/dL — ABNORMAL HIGH (ref 210–475)

## 2021-01-17 MED ORDER — IPRATROPIUM-ALBUTEROL 20-100 MCG/ACT IN AERS
1.0000 | INHALATION_SPRAY | Freq: Four times a day (QID) | RESPIRATORY_TRACT | Status: DC
  Administered 2021-01-17 (×2): 1 via RESPIRATORY_TRACT
  Filled 2021-01-17: qty 4

## 2021-01-17 MED ORDER — ZINC SULFATE 220 (50 ZN) MG PO CAPS
220.0000 mg | ORAL_CAPSULE | Freq: Every day | ORAL | Status: DC
  Administered 2021-01-17 – 2021-01-20 (×4): 220 mg via ORAL
  Filled 2021-01-17 (×4): qty 1

## 2021-01-17 MED ORDER — PANTOPRAZOLE SODIUM 40 MG PO TBEC
40.0000 mg | DELAYED_RELEASE_TABLET | Freq: Every day | ORAL | Status: DC
  Administered 2021-01-17 – 2021-01-20 (×4): 40 mg via ORAL
  Filled 2021-01-17 (×4): qty 1

## 2021-01-17 MED ORDER — ENOXAPARIN SODIUM 40 MG/0.4ML IJ SOSY
40.0000 mg | PREFILLED_SYRINGE | INTRAMUSCULAR | Status: DC
  Administered 2021-01-17 – 2021-01-20 (×4): 40 mg via SUBCUTANEOUS
  Filled 2021-01-17 (×4): qty 0.4

## 2021-01-17 MED ORDER — SODIUM CHLORIDE 0.9 % IV SOLN: 100.0000 mg | Freq: Every day | INTRAVENOUS | Status: DC

## 2021-01-17 MED ORDER — SODIUM CHLORIDE 0.9 % IV SOLN
100.0000 mg | Freq: Every day | INTRAVENOUS | Status: DC
  Administered 2021-01-18 – 2021-01-20 (×3): 100 mg via INTRAVENOUS
  Filled 2021-01-17 (×4): qty 20

## 2021-01-17 MED ORDER — METHYLPREDNISOLONE SODIUM SUCC 125 MG IJ SOLR
125.0000 mg | INTRAMUSCULAR | Status: DC
  Administered 2021-01-17 – 2021-01-18 (×2): 125 mg via INTRAVENOUS
  Filled 2021-01-17 (×2): qty 2

## 2021-01-17 MED ORDER — SODIUM CHLORIDE 0.9 % IV SOLN: INTRAVENOUS | Status: AC

## 2021-01-17 MED ORDER — SODIUM CHLORIDE 0.9 % IV SOLN
100.0000 mg | INTRAVENOUS | Status: AC
Start: 2021-01-17 — End: 2021-01-17
  Administered 2021-01-17 (×2): 100 mg via INTRAVENOUS
  Filled 2021-01-17: qty 20

## 2021-01-17 MED ORDER — ACETAMINOPHEN 325 MG PO TABS
650.0000 mg | ORAL_TABLET | Freq: Four times a day (QID) | ORAL | Status: DC | PRN
  Administered 2021-01-19: 650 mg via ORAL
  Filled 2021-01-17: qty 2

## 2021-01-17 MED ORDER — ALBUTEROL SULFATE HFA 108 (90 BASE) MCG/ACT IN AERS: 2.0000 | INHALATION_SPRAY | RESPIRATORY_TRACT | Status: DC | PRN

## 2021-01-17 MED ORDER — HYDROCOD POLST-CPM POLST ER 10-8 MG/5ML PO SUER
5.0000 mL | Freq: Two times a day (BID) | ORAL | Status: DC | PRN
Start: 2021-01-17 — End: 2021-01-20
  Administered 2021-01-20: 5 mL via ORAL
  Filled 2021-01-17: qty 5

## 2021-01-17 MED ORDER — ONDANSETRON HCL 4 MG PO TABS: 4.0000 mg | ORAL_TABLET | Freq: Four times a day (QID) | ORAL | Status: DC | PRN

## 2021-01-17 MED ORDER — GUAIFENESIN-DM 100-10 MG/5ML PO SYRP: 10.0000 mL | ORAL_SOLUTION | ORAL | Status: DC | PRN

## 2021-01-17 MED ORDER — IPRATROPIUM-ALBUTEROL 20-100 MCG/ACT IN AERS
1.0000 | INHALATION_SPRAY | Freq: Three times a day (TID) | RESPIRATORY_TRACT | Status: DC
  Administered 2021-01-18 (×3): 1 via RESPIRATORY_TRACT

## 2021-01-17 MED ORDER — SODIUM CHLORIDE 0.9 % IV SOLN: 200.0000 mg | Freq: Once | INTRAVENOUS | Status: DC

## 2021-01-17 MED ORDER — ASCORBIC ACID 500 MG PO TABS
500.0000 mg | ORAL_TABLET | Freq: Every day | ORAL | Status: DC
  Administered 2021-01-17 – 2021-01-20 (×4): 500 mg via ORAL
  Filled 2021-01-17 (×4): qty 1

## 2021-01-17 MED ORDER — ONDANSETRON HCL 4 MG/2ML IJ SOLN: 4.0000 mg | Freq: Four times a day (QID) | INTRAMUSCULAR | Status: DC | PRN

## 2021-01-17 MED ORDER — POTASSIUM CHLORIDE CRYS ER 20 MEQ PO TBCR
40.0000 meq | EXTENDED_RELEASE_TABLET | ORAL | Status: AC
  Administered 2021-01-17 (×3): 40 meq via ORAL
  Filled 2021-01-17: qty 4
  Filled 2021-01-17 (×2): qty 2

## 2021-01-17 NOTE — ED Triage Notes (Signed)
Pt arrived by [facility] for "feeling sick" when arrived pt c/o feeling weak for 4 days. Pt oxygen was 86 on RA, pt doesn't use oxygen at home. Pt tested postive for covid this morning.

## 2021-01-17 NOTE — ED Provider Notes (Signed)
[facility] EMERGENCY DEPARTMENT Provider Note   CSN: [id number] Arrival date & time: 01/17/21  2725     History Chief Complaint  Patient presents with   Weakness    Denise Mendoza is a 87 y.o. female.  The history is provided by the patient and the EMS personnel.  Weakness Associated symptoms: cough   Associated symptoms: no abdominal pain and no chest pain   Patient presents with fatigue.  Reportedly has been feeling sick over the last 4 days.  Hypoxic upon arrival of EMS.  Not on oxygen.  Patient denies any pulmonary issues but appears to have a diagnosis of emphysema.  Sats of 86% on arrival for EMS.  Reportedly also tested positive for COVID this morning.  Patient states she lives at home by herself.  Has had an occasional cough.  Denies fevers.  Denies nausea vomiting diarrhea.  Denies getting her COVID-vaccine.    Past Medical History:  Diagnosis Date   Hyperlipidemia    Hypertension    Lower back pain Oct 2013   Occlusive disease, arterial    Peripheral vascular disease [facility])    Stress Oct. 2013   Dr. Ignatius Specking    Patient Active Problem List   Diagnosis Date Noted   Acute cystitis without hematuria    Pulmonary emphysema (HCC)    Closed displaced fracture of right femoral neck with routine healing 01/01/2020   Hypokalemia 01/01/2020   Dehydration 01/01/2020   Respiratory failure with hypoxia (HCC) 01/01/2020   Vitamin B12 deficiency 01/01/2020   Thrombocytopenia (HCC) 01/01/2020   Leukocytosis 01/01/2020   Acute lower UTI 01/01/2020   Essential hypertension 01/01/2020   Hyperlipidemia 01/01/2020   Altered mental status 12/31/2019   Bilateral leg pain 05/12/2012   Hip pain 05/12/2012    Past Surgical History:  Procedure Laterality Date   ABDOMINAL HYSTERECTOMY     BACK SURGERY     HIP PINNING,CANNULATED Right 01/03/2020   Procedure: OPEN TREATMENT INTERNAL FIXATION RIGHT HIP;  Surgeon: Vickki Hearing, MD;  Location: [facility];  Service:  Orthopedics;  Laterality: Right;   NOSE SURGERY     TUMOR REMOVED FROM FINGER       OB History   No obstetric history on file.     Family History  Problem Relation Age of Onset   Heart disease Mother    Other Father        MACULARDEGENERATION    Social History   Tobacco Use   Smoking status: Every Day    Packs/day: 1.00    Years: 30.00    Pack years: 30.00    Types: Cigarettes   Smokeless tobacco: Never  Substance Use Topics   Alcohol use: No   Drug use: No    Home Medications Prior to Admission medications   Medication Sig Start Date End Date Taking? Authorizing Provider  ALPRAZolam Prudy Feeler) 0.5 MG tablet Take 1 mg by mouth 2 (two) times daily.     [provider]  amitriptyline (ELAVIL) 10 MG tablet TAKE ONE TABLET BY MOUTH AT BEDTIME 01/04/21   Vickki Hearing, MD  aspirin EC 325 MG EC tablet Take 1 tablet (325 mg total) by mouth daily with breakfast. 01/06/20   Vassie Loll, MD  cyanocobalamin (,VITAMIN B-12,) 1000 MCG/ML injection Daily for 5 days; then weekly for a month and then monthly after that. 01/05/20   Vassie Loll, MD  docusate sodium (COLACE) 100 MG capsule Take 1 capsule (100 mg total) by mouth 2 (  two) times daily as needed for mild constipation. 01/05/20   Vassie Loll, MD  ibuprofen (ADVIL) 800 MG tablet Take 1 tablet (800 mg total) by mouth every 8 (eight) hours as needed. 01/17/20   Vickki Hearing, MD  metoprolol tartrate (LOPRESSOR) 25 MG tablet Take 1 tablet (25 mg total) by mouth 2 (two) times daily. 01/05/20   Vassie Loll, MD  predniSONE (STERAPRED UNI-PAK 48 TAB) 10 MG (48) TBPK tablet Take by mouth daily. 03/13/20   Vickki Hearing, MD  simvastatin (ZOCOR) 40 MG tablet Take 40 mg by mouth every evening.    [provider]  traMADol (ULTRAM) 50 MG tablet Take 50 mg by mouth 4 (four) times daily. 02/11/20   [provider]    Allergies    Patient has no known allergies.  Review of Systems   Review of  Systems  Constitutional:  Positive for appetite change and fatigue.  HENT:  Negative for congestion.   Respiratory:  Positive for cough.   Cardiovascular:  Negative for chest pain.  Gastrointestinal:  Negative for abdominal pain.  Genitourinary:  Negative for enuresis.  Musculoskeletal:  Negative for back pain.  Skin:  Negative for rash.  Neurological:  Positive for weakness.  Psychiatric/Behavioral:  Negative for confusion.    Physical Exam Updated Vital Signs BP (!) 106/58   Pulse 92   Temp 98.5 F (36.9 C) (Oral)   Resp (!) 32   Ht 5\' 3"  (1.6 m)   Wt 46.3 kg   SpO2 99%   BMI 18.07 kg/m   Physical Exam Vitals and nursing note reviewed.  HENT:     Head: Atraumatic.     Mouth/Throat:     Mouth: Mucous membranes are moist.  Eyes:     General: No scleral icterus. Cardiovascular:     Rate and Rhythm: Regular rhythm.  Pulmonary:     Comments: Slightly harsh breath sounds without focal rales or rhonchi. Abdominal:     Tenderness: There is no abdominal tenderness.  Musculoskeletal:        General: No tenderness.     Cervical back: Neck supple.  Skin:    General: Skin is warm.     Capillary Refill: Capillary refill takes less than 2 seconds.  Neurological:     Mental Status: She is alert.     Comments: Awake and pleasant.  Answers questions.    ED Results / Procedures / Treatments   Labs (all labs ordered are listed, but only abnormal results are displayed) Labs Reviewed  RESP PANEL BY RT-PCR (FLU A&B, COVID) ARPGX2 - Abnormal; Notable for the following components:      Result Value   SARS Coronavirus 2 by RT PCR POSITIVE (*)    All other components within normal limits  CBC WITH DIFFERENTIAL/PLATELET - Abnormal; Notable for the following components:   WBC 2.5 (*)    Platelets 108 (*)    Neutro Abs 1.6 (*)    All other components within normal limits  COMPREHENSIVE METABOLIC PANEL - Abnormal; Notable for the following components:   Potassium 2.9 (*)    CO2 33  (*)    Calcium 7.6 (*)    Total Protein 6.1 (*)    Albumin 3.0 (*)    All other components within normal limits  D-DIMER, QUANTITATIVE - Abnormal; Notable for the following components:   D-Dimer, Quant 2.70 (*)    All other components within normal limits  LACTATE DEHYDROGENASE - Abnormal; Notable for the following components:  LDH 236 (*)    All other components within normal limits  FERRITIN - Abnormal; Notable for the following components:   Ferritin 459 (*)    All other components within normal limits  FIBRINOGEN - Abnormal; Notable for the following components:   Fibrinogen 485 (*)    All other components within normal limits  C-REACTIVE PROTEIN - Abnormal; Notable for the following components:   CRP 10.4 (*)    All other components within normal limits  TROPONIN I (HIGH SENSITIVITY) - Abnormal; Notable for the following components:   Troponin I (High Sensitivity) 25 (*)    All other components within normal limits  CULTURE, BLOOD (ROUTINE X 2)  CULTURE, BLOOD (ROUTINE X 2)  LACTIC ACID, PLASMA  PROCALCITONIN  TRIGLYCERIDES  TROPONIN I (HIGH SENSITIVITY)    EKG EKG Interpretation  Date/Time:  Wednesday January 17 2021 08:11:24 EDT Ventricular Rate:  101 PR Interval:  163 QRS Duration: 104 QT Interval:  400 QTC Calculation: 519 R Axis:   -14 Text Interpretation: Sinus tachycardia Left ventricular hypertrophy ST depression, consider ischemia, lateral lds Minimal ST elevation, inferior leads Prolonged QT interval Confirmed by Benjiman Core [phone]) on 01/17/2021 8:43:25 AM  Radiology DG Chest Port 1 View  Result Date: 01/17/2021 CLINICAL DATA:  Weakness and hypoxia.  COVID positive. EXAM: PORTABLE CHEST 1 VIEW COMPARISON:  Chest x-ray dated January 04, 2020. FINDINGS: Normal heart size with unchanged aneurysmal thoracic aorta. Chronic interstitial coarsening with new mild interstitial thickening at the peripheral left lung base. No focal consolidation, pleural  effusion, or pneumothorax. No acute osseous abnormality. IMPRESSION: 1. New mild interstitial thickening at the peripheral left lung base may reflect atypical infection given clinical history. Electronically Signed   By: Obie Dredge M.D.   On: 01/17/2021 09:02    Procedures Procedures   Medications Ordered in ED Medications - No data to display  ED Course  I have reviewed the triage vital signs and the nursing notes.  Pertinent labs & imaging results that were available during my care of the patient were reviewed by me and considered in my medical decision making (see chart for details).    MDM Rules/Calculators/A&P                           Patient brought in for generalized weakness and feeling sick.  Reportedly had positive COVID test at home.  Is positive for COVID here.  Is hypoxic on room air.  Not on oxygen at baseline.  Patient denies underlying lung issues but appears to have a diagnosis of emphysema.  Nonspecific EKG changes.  Troponin mildly elevated but I think most likely secondary to the COVID.  Will require admission to the hospital.  Reportedly patient is unvaccinated.  Will discuss with hospitalist Final Clinical Impression(s) / ED Diagnoses Final diagnoses:  COVID-19  Hypoxia    Rx / DC Orders ED Discharge Orders     None        Benjiman Core, MD 01/17/21 1206

## 2021-01-17 NOTE — ED Notes (Signed)
Family requesting to speak with social worker regarding placement.

## 2021-01-17 NOTE — ED Notes (Signed)
Date and time results received: 01/17/21 9:59 AM  Test: covie Critical Value: positive  Name of Provider Notified: dr.pickering  Orders Received? Or Actions Taken?: md notified

## 2021-01-17 NOTE — H&P (Signed)
History and Physical    Denise Mendoza:[id number] DOB: June 11, 1931 DOA: 01/17/2021  PCP: Ignatius Specking, MD   Patient coming from: Home  I have personally briefly reviewed patient's old medical records in [facility]  Chief Complaint: Shortness of breath, nonproductive coughing spells and general malaise.  HPI: Denise Mendoza is a 87 y.o. female with medical history significant of hypertension, hyperlipidemia, tobacco abuse and emphysema; who presented to the hospital secondary to shortness of breath, general malaise and nonproductive cough and spells.  Patient lives at home by herself and expressed not being vaccinated against COVID.  She denies any nausea, vomiting, chest pain, abdominal pain, dysuria, hematuria, melena, hematochezia or any other complaints. Respiratory symptoms and general malaise have been present for the last 4 days and worsening.  On EMS arrival of the patient's property she was found to be hypoxic at 86% on room air.  Patient did no use oxygen at baseline.  ED Course: Chest x-ray ordered and demonstrating atypical infiltrates and emphysematous changes.  Positive COVID by PCR.  TRH has been consulted to place patient in the hospital for further evaluation and management.  Review of Systems: As per HPI otherwise all other systems reviewed and are negative.   Past Medical History:  Diagnosis Date   Hyperlipidemia    Hypertension    Lower back pain Oct 2013   Occlusive disease, arterial    Peripheral vascular disease [facility])    Stress Oct. 2013   Dr. Ignatius Specking    Past Surgical History:  Procedure Laterality Date   ABDOMINAL HYSTERECTOMY     BACK SURGERY     HIP PINNING,CANNULATED Right 01/03/2020   Procedure: OPEN TREATMENT INTERNAL FIXATION RIGHT HIP;  Surgeon: Vickki Hearing, MD;  Location: [facility];  Service: Orthopedics;  Laterality: Right;   NOSE SURGERY     TUMOR REMOVED FROM FINGER      Social History  reports that she has been  smoking cigarettes. She has a 30.00 pack-year smoking history. She has never used smokeless tobacco. She reports that she does not drink alcohol and does not use drugs.  No Known Allergies  Family History  Problem Relation Age of Onset   Heart disease Mother    Other Father        MACULARDEGENERATION    Prior to Admission medications   Medication Sig Start Date End Date Taking? Authorizing Provider  ALPRAZolam Prudy Feeler) 0.5 MG tablet Take 1 mg by mouth 2 (two) times daily.  Patient not taking: Reported on 01/17/2021    [provider]  amitriptyline (ELAVIL) 10 MG tablet TAKE ONE TABLET BY MOUTH AT BEDTIME Patient not taking: No sig reported 01/04/21   Vickki Hearing, MD  aspirin EC 325 MG EC tablet Take 1 tablet (325 mg total) by mouth daily with breakfast. Patient not taking: Reported on 01/17/2021 01/06/20   Vassie Loll, MD  cyanocobalamin (,VITAMIN B-12,) 1000 MCG/ML injection Daily for 5 days; then weekly for a month and then monthly after that. Patient not taking: No sig reported 01/05/20   Vassie Loll, MD  docusate sodium (COLACE) 100 MG capsule Take 1 capsule (100 mg total) by mouth 2 (two) times daily as needed for mild constipation. Patient not taking: No sig reported 01/05/20   Vassie Loll, MD  ibuprofen (ADVIL) 800 MG tablet Take 1 tablet (800 mg total) by mouth every 8 (eight) hours as needed. Patient not taking: No sig reported 01/17/20   Fuller Canada  E, MD  metoprolol tartrate (LOPRESSOR) 25 MG tablet Take 1 tablet (25 mg total) by mouth 2 (two) times daily. Patient not taking: No sig reported 01/05/20   Vassie Loll, MD  predniSONE (STERAPRED UNI-PAK 48 TAB) 10 MG (48) TBPK tablet Take by mouth daily. Patient not taking: No sig reported 03/13/20   Vickki Hearing, MD  simvastatin (ZOCOR) 40 MG tablet Take 40 mg by mouth every evening. Patient not taking: Reported on 01/17/2021    [provider]  traMADol (ULTRAM) 50 MG tablet Take 50 mg by  mouth 4 (four) times daily. Patient not taking: No sig reported 02/11/20   [provider]    Physical Exam: Vitals:   01/17/21 1415 01/17/21 1430 01/17/21 1514 01/17/21 1607  BP: (!) 120/57 (!) 118/53  127/76  Pulse:  97  79  Resp: (!) 24 (!) 31  20  Temp:    98.8 F (37.1 C)  TempSrc:    Oral  SpO2:  94% 93% 94%  Weight:    46.2 kg  Height:     (1.6 m)    Constitutional: No chest pain, no nausea, no vomiting.  Generally weak and chronically ill in appearance. Vitals:   01/17/21 1415 01/17/21 1430 01/17/21 1514 01/17/21 1607  BP: (!) 120/57 (!) 118/53  127/76  Pulse:  97  79  Resp: (!) 24 (!) 31  20  Temp:    98.8 F (37.1 C)  TempSrc:    Oral  SpO2:  94% 93% 94%  Weight:    46.2 kg  Height:     (1.6 m)   Eyes: PERRL, lids and conjunctivae normal, no icterus, no nystagmus. ENMT: Mucous membranes are moist. Posterior pharynx clear of any exudate or lesions. Neck: normal, supple, no masses, no thyromegaly, no JVD. Respiratory: Positive tachypnea with minimal exertion; no using accessory muscle.  Bilateral rhonchi appreciated on exam.  No wheezing. Cardiovascular: Regular rate and rhythm, no rubs or gallops.  No lower extremity edema. Abdomen: no tenderness, no masses palpated. No hepatosplenomegaly. Bowel sounds positive.  Musculoskeletal: no clubbing / cyanosis. No joint deformity upper and lower extremities. Good ROM, no contractures. Normal muscle tone.  Skin: no rashes, no petechiae. Neurologic: CN 2-12 grossly intact. Sensation intact, DTR normal.  Generally weak without focal deficit. Psychiatric: Normal judgment and insight. Alert and oriented x 3. Normal mood.    Labs on Admission: I have personally reviewed following labs and imaging studies  CBC: Recent Labs  Lab 01/17/21 0917  WBC 2.5*  NEUTROABS 1.6*  HGB 12.1  HCT 39.7  MCV 98.8  PLT 108*    Basic Metabolic Panel: Recent Labs  Lab 01/17/21 0917  NA 141  K 2.9*  CL 100  CO2  33*  GLUCOSE 97  BUN 15  CREATININE 0.65  CALCIUM 7.6*    GFR: Estimated Creatinine Clearance: 36.1 mL/min (by C-G formula based on SCr of 0.65 mg/dL).  Liver Function Tests: Recent Labs  Lab 01/17/21 0917  AST 26  ALT 9  ALKPHOS 40  BILITOT 1.0  PROT 6.1*  ALBUMIN 3.0*    Urine analysis:    Component Value Date/Time   COLORURINE YELLOW 12/31/2019 2230   APPEARANCEUR CLEAR 12/31/2019 2230   LABSPEC 1.015 12/31/2019 2230   PHURINE 6.0 12/31/2019 2230   GLUCOSEU NEGATIVE 12/31/2019 2230   HGBUR MODERATE (A) 12/31/2019 2230   BILIRUBINUR NEGATIVE 12/31/2019 2230   KETONESUR 5 (A) 12/31/2019 2230   PROTEINUR >=300 (A) 12/31/2019  2230   NITRITE NEGATIVE 12/31/2019 2230   LEUKOCYTESUR SMALL (A) 12/31/2019 2230    Radiological Exams on Admission: DG Chest Port 1 View  Result Date: 01/17/2021 CLINICAL DATA:  Weakness and hypoxia.  COVID positive. EXAM: PORTABLE CHEST 1 VIEW COMPARISON:  Chest x-ray dated January 04, 2020. FINDINGS: Normal heart size with unchanged aneurysmal thoracic aorta. Chronic interstitial coarsening with new mild interstitial thickening at the peripheral left lung base. No focal consolidation, pleural effusion, or pneumothorax. No acute osseous abnormality. IMPRESSION: 1. New mild interstitial thickening at the peripheral left lung base may reflect atypical infection given clinical history. Electronically Signed   By: Obie Dredge M.D.   On: 01/17/2021 09:02    EKG: Independently reviewed.  Sinus rhythm; no acute ischemic changes.  Assessment/Plan 1-acute respiratory failure with hypoxia due to COVID-pneumonia -Positive atypical infiltrates appreciated on x-ray; patient hypoxic on room air with oxygen saturation in the 86% (reported by EMS). -Inflammatory markers also elevated -Patient's symptoms present and progressing for the last 4 days -Started treatment with remdesivir and steroids -Follow inflammatory markers trend -Flutter valve, antitussive  medication, bronchodilators and supportive care will be provided. -Patient is now vaccinated against COVID. -Vitamin C and vitamin C has been added to her regimen.  2-hyperlipidemia -Has not been taking any medication for cholesterol as an outpatient -Heart healthy diet has been ordered -Will need close outpatient follow-up with PCP to restart management. -Holding statins presumptions currently the patient has been started on remdesivir and there is a chance for transaminitis to call.  3-GERD -continue PPI  4-HTN -Stable overall -Patient has not been using any antihypertensive agent currently -Follow vital signs and resume medications as needed.  5-physical deconditioning -In part secondary to acute COVID infection -Will ask physical therapy for evaluation.  6-hypokalemia -Provide repletion and follow electrolytes trend  7-tobacco abuse -Cessation counseling has been provided. -Currently declining the use of nicotine patch   DVT prophylaxis: Lovenox Code Status:   Full code Family Communication:  No family at bedside. Disposition Plan:   Patient is from:  Home  Anticipated DC to:  To be determined  Anticipated DC date:  To be determined  Anticipated DC barriers: Stabilization of acute hypoxemic respiratory failure due to COVID-pneumonia.  Consults called: None Admission status:  Inpatient, MedSurg, length of stay more than 2 midnights.  Severity of Illness: The appropriate patient status for this patient is INPATIENT. Inpatient status is judged to be reasonable and necessary in order to provide the required intensity of service to ensure the patient's safety. The patient's presenting symptoms, physical exam findings, and initial radiographic and laboratory data in the context of their chronic comorbidities is felt to place them at high risk for further clinical deterioration. Furthermore, it is not anticipated that the patient will be medically stable for discharge from the  hospital within 2 midnights of admission. The following factors support the patient status of inpatient.   " The patient's presenting symptoms include shortness of breath and general malaise. " The worrisome physical exam findings include hypoxia. " The initial radiographic and laboratory data are worrisome because of atypical infiltrates appreciated on x-ray. " The chronic co-morbidities include emphysema and hypertension   * I certify that at the point of admission it is my clinical judgment that the patient will require inpatient hospital care spanning beyond 2 midnights from the point of admission due to high intensity of service, high risk for further deterioration and high frequency of surveillance required.Mikle Bosworth  Gwenlyn Perking MD [facility]  How to contact the [facility] Attending or Consulting provider 7A - 7P or covering provider during after hours 7P -7A, for this patient?   Check the care team in [facility] and look for a) attending/consulting [facility] provider listed and b) the [facility] team listed Log into [email or web address] and use [facility] universal password to access. If you do not have the password, please contact the hospital operator. Locate the [facility] provider you are looking for under [facility] and page to a number that you can be directly reached. If you still have difficulty reaching the provider, please page the [facility] (Director on Call) for the Hospitalists listed on amion for assistance.  01/17/2021, 6:21 PM

## 2021-01-18 DIAGNOSIS — J1282 Pneumonia due to coronavirus disease 2019: Secondary | ICD-10-CM | POA: Diagnosis not present

## 2021-01-18 DIAGNOSIS — U071 COVID-19: Secondary | ICD-10-CM | POA: Diagnosis not present

## 2021-01-18 LAB — CBC WITH DIFFERENTIAL/PLATELET
Abs Immature Granulocytes: 0 10*3/uL (ref 0.00–0.07)
Basophils Absolute: 0 10*3/uL (ref 0.0–0.1)
Basophils Relative: 0 %
Eosinophils Absolute: 0 10*3/uL (ref 0.0–0.5)
Eosinophils Relative: 0 %
HCT: 37.4 % (ref 36.0–46.0)
Hemoglobin: 11.4 g/dL — ABNORMAL LOW (ref 12.0–15.0)
Immature Granulocytes: 0 %
Lymphocytes Relative: 22 %
Lymphs Abs: 0.3 10*3/uL — ABNORMAL LOW (ref 0.7–4.0)
MCH: 30 pg (ref 26.0–34.0)
MCHC: 30.5 g/dL (ref 30.0–36.0)
MCV: 98.4 fL (ref 80.0–100.0)
Monocytes Absolute: 0.1 10*3/uL (ref 0.1–1.0)
Monocytes Relative: 6 %
Neutro Abs: 1 10*3/uL — ABNORMAL LOW (ref 1.7–7.7)
Neutrophils Relative %: 72 %
Platelets: 106 10*3/uL — ABNORMAL LOW (ref 150–400)
RBC: 3.8 MIL/uL — ABNORMAL LOW (ref 3.87–5.11)
RDW: 14.9 % (ref 11.5–15.5)
WBC: 1.3 10*3/uL — CL (ref 4.0–10.5)
nRBC: 0 % (ref 0.0–0.2)

## 2021-01-18 LAB — C-REACTIVE PROTEIN: CRP: 9.3 mg/dL — ABNORMAL HIGH (ref <1.0)

## 2021-01-18 LAB — FERRITIN: Ferritin: 411 ng/mL — ABNORMAL HIGH (ref 11–307)

## 2021-01-18 LAB — COMPREHENSIVE METABOLIC PANEL
ALT: 9 U/L (ref 0–44)
AST: 27 U/L (ref 15–41)
Albumin: 2.6 g/dL — ABNORMAL LOW (ref 3.5–5.0)
Alkaline Phosphatase: 41 U/L (ref 38–126)
Anion gap: 6 (ref 5–15)
BUN: 18 mg/dL (ref 8–23)
CO2: 27 mmol/L (ref 22–32)
Calcium: 7.3 mg/dL — ABNORMAL LOW (ref 8.9–10.3)
Chloride: 108 mmol/L (ref 98–111)
Creatinine, Ser: 0.49 mg/dL (ref 0.44–1.00)
GFR, Estimated: >60 mL/min (ref >60)
Glucose, Bld: 133 mg/dL — ABNORMAL HIGH (ref 70–99)
Potassium: 5.2 mmol/L — ABNORMAL HIGH (ref 3.5–5.1)
Sodium: 141 mmol/L (ref 135–145)
Total Bilirubin: 0.7 mg/dL (ref 0.3–1.2)
Total Protein: 5.5 g/dL — ABNORMAL LOW (ref 6.5–8.1)

## 2021-01-18 LAB — PHOSPHORUS: Phosphorus: 2.5 mg/dL (ref 2.5–4.6)

## 2021-01-18 LAB — MAGNESIUM: Magnesium: 1.7 mg/dL (ref 1.7–2.4)

## 2021-01-18 LAB — D-DIMER, QUANTITATIVE: D-Dimer, Quant: 2.39 ug/mL-FEU — ABNORMAL HIGH (ref 0.00–0.50)

## 2021-01-18 MED ORDER — ENSURE ENLIVE PO LIQD
237.0000 mL | Freq: Three times a day (TID) | ORAL | Status: DC
  Administered 2021-01-18 – 2021-01-20 (×5): 237 mL via ORAL

## 2021-01-18 MED ORDER — ALPRAZOLAM 0.5 MG PO TABS
0.5000 mg | ORAL_TABLET | Freq: Three times a day (TID) | ORAL | Status: DC | PRN
  Administered 2021-01-18 – 2021-01-19 (×4): 0.5 mg via ORAL
  Filled 2021-01-18 (×4): qty 1

## 2021-01-18 MED ORDER — IPRATROPIUM-ALBUTEROL 20-100 MCG/ACT IN AERS: 1.0000 | INHALATION_SPRAY | Freq: Four times a day (QID) | RESPIRATORY_TRACT | Status: DC | PRN

## 2021-01-18 MED ORDER — TRAMADOL HCL 50 MG PO TABS
50.0000 mg | ORAL_TABLET | Freq: Four times a day (QID) | ORAL | Status: DC | PRN
Start: 2021-01-18 — End: 2021-01-20
  Administered 2021-01-18 – 2021-01-20 (×7): 50 mg via ORAL
  Filled 2021-01-18 (×7): qty 1

## 2021-01-18 NOTE — Progress Notes (Signed)
Date and time results received: 01/18/21 0758 (use smartphrase ".now" to insert current time)  Test: WBC Critical Value: 1.3  Name of Provider Notified: [facility]

## 2021-01-18 NOTE — Progress Notes (Signed)
PROGRESS NOTE    Patient: Denise Mendoza                            PCP: Ignatius Specking, MD                    DOB: 12/31/1931            DOA: 01/17/2021 OZH:[id number]             DOS: 01/18/2021, 11:44 AM   LOS: 1 day   Date of Service: The patient was seen and examined on 01/18/2021  Subjective:   The patient was seen and examined this morning. Hemodynamically stable, still on 2 L of oxygen, satting>  Requesting her tramadol and Xanax to be restarted due to chronic pain and anxiety.  Brief Narrative:    ARISTA KETTLEWELL is a 87 y.o. female with medical history significant of hypertension, hyperlipidemia, tobacco abuse and emphysema; who presented to the hospital secondary to shortness of breath, general malaise and nonproductive cough and spells.  Patient lives at home by herself and expressed not being vaccinated against COVID.  She denies any nausea, vomiting, chest pain, abdominal pain, dysuria, hematuria, melena, hematochezia or any other complaints. Respiratory symptoms and general malaise have been present for the last 4 days and worsening.  On EMS arrival of the patient's property she was found to be hypoxic at 86% on room air.  Patient did no use oxygen at baseline.   ED Course: Chest x-ray ordered and demonstrating atypical infiltrates and emphysematous changes.  Positive COVID by PCR.  TRH has been consulted to place patient in the hospital for further evaluation and management.    Assessment & Plan:   Active Problems:   Pneumonia due to COVID-19 virus    Acute respiratory failure with hypoxia due to COVID-pneumonia -Awake alert, respiratory effort improved, satting 97% on 2 L of oxygen  -Positive atypical infiltrates appreciated on x-ray; patient hypoxic on room air with oxygen saturation in the 86% (reported by EMS). -Elevated inflammatory markers-improving -Continue IV remdesivir, steroids with goal to taper down  -Flutter valve, antitussive medication,  bronchodilators and supportive care will be provided. -Patient is now vaccinated against COVID. -Vitamin C and vitamin C has been added to her regimen.   Hyperlipidemia -Has not been taking statins at home, will continue heart healthy diet -Will need close outpatient follow-up with PCP to restart management. -Holding statins presumptions currently the patient has been started on remdesivir and there is a chance for transaminitis to call.   GERD -continue PPI -Stable   HTN -Monitoring remained stable -Patient has not been using any antihypertensive agent currently -Follow vital signs and resume medications as needed.   physical deconditioning -In part secondary to acute COVID infection -Pending PT OT evaluation recommendation   History of anxiety-chronic pain  -Resuming home medication of Xanax and tramadol as needed -Additional analgesics as needed   hypokalemia -Monitoring, repleting   Tobacco abuse -Stating she continues to smoke at home -Cessation counseling has been provided. -Currently declining the use of nicotine patch    Acute leukopenia -WBC dropped to 2.5 >> 1.3,... Likely due to COVID infection, possible remdesivir On steroids, will monitor closely   ----------------------------------------------------------------------------------------------------------------------------------------------- Cultures; Blood Cultures x 2 >> NGT  Sputum Culture >> could not be obtained  Antimicrobials: None   Consultants: none    ------------------------------------------------------------------------------------------------------------------------------------------------  DVT prophylaxis:  SCD/Compression stockings and Lovenox SQ Code  Status:   Code Status: Full Code  Family Communication: No family member present at bedside- attempt will be made to update daily The above findings and plan of care has been discussed with patient (and family)  in detail,  they  expressed understanding and agreement of above. -Advance care planning has been discussed.   Admission status:   Status is: Inpatient  Remains inpatient appropriate because:Hemodynamically unstable, IV treatments appropriate due to intensity of illness or inability to take PO, and Inpatient level of care appropriate due to severity of illness  Dispo: The patient is from: Home              Anticipated d/c is to: Home              Patient currently is not medically stable to d/c.Anticipated DC barriers:Stabilization of acute hypoxemic respiratory failure due to COVID-pneumonia.     Difficult to place patient No      Level of care: Med-Surg   Procedures:   No admission procedures for hospital encounter.    Antimicrobials:  Anti-infectives (From admission, onward)    Start     Dose/Rate Route Frequency Ordered Stop   01/18/21 1000  remdesivir 100 mg in sodium chloride 0.9 % 100 mL IVPB  Status:  Discontinued       See Hyperspace for full Linked Orders Report.   100 mg 200 mL/hr over 30 Minutes Intravenous Daily 01/17/21 1231 01/17/21 1241   01/18/21 1000  remdesivir 100 mg in sodium chloride 0.9 % 100 mL IVPB        100 mg 200 mL/hr over 30 Minutes Intravenous Daily 01/17/21 1241 01/22/21 0959   01/17/21 1300  remdesivir 100 mg in sodium chloride 0.9 % 100 mL IVPB        100 mg 200 mL/hr over 30 Minutes Intravenous Every 1 hr x 2 01/17/21 1241 01/17/21 1828   01/17/21 1230  remdesivir 200 mg in sodium chloride 0.9% 250 mL IVPB  Status:  Discontinued       See Hyperspace for full Linked Orders Report.   200 mg 580 mL/hr over 30 Minutes Intravenous Once 01/17/21 1231 01/17/21 1241        Medication:   vitamin C  500 mg Oral Daily   enoxaparin (LOVENOX) injection  40 mg Subcutaneous Q24H   feeding supplement  237 mL Oral TID BM   Ipratropium-Albuterol  1 puff Inhalation TID   methylPREDNISolone (SOLU-MEDROL) injection  125 mg Intravenous Q24H   pantoprazole  40 mg Oral  Daily   zinc sulfate  220 mg Oral Daily    acetaminophen, albuterol, ALPRAZolam, chlorpheniramine-HYDROcodone, guaiFENesin-dextromethorphan, ondansetron **OR** ondansetron (ZOFRAN) IV, traMADol   Objective:   Vitals:   01/17/21 1942 01/17/21 2125 01/18/21 0534 01/18/21 0824  BP:  (!) 109/59 (!) 144/71   Pulse:  86 75   Resp:  19 18   Temp:  98.1 F (36.7 C) 98.3 F (36.8 C)   TempSrc:      SpO2: 92% 91% 98% 97%  Weight:      Height:        Intake/Output Summary (Last 24 hours) at 01/18/2021 1144 Last data filed at 01/18/2021 0500 Gross per 24 hour  Intake 161.28 ml  Output 200 ml  Net -38.72 ml   Filed Weights   01/17/21 0807 01/17/21 1607  Weight: 46.3 kg 46.2 kg     Examination:   Physical Exam  Constitution:  Alert, cooperative, no distress,  Appears calm and  comfortable  Psychiatric: Normal and stable mood and affect, cognition intact,   HEENT: Normocephalic, PERRL, otherwise with in Normal limits  Chest:Chest symmetric Cardio vascular:  S1/S2, RRR, No murmure, No Rubs or Gallops  pulmonary: Clear to auscultation bilaterally, respirations unlabored, negative wheezes / crackles Abdomen: Soft, non-tender, non-distended, bowel sounds,no masses, no organomegaly Muscular skeletal: Limited exam - in bed, able to move all 4 extremities, Normal strength,  Neuro: CNII-XII intact. , normal motor and sensation, reflexes intact  Extremities: No pitting edema lower extremities, +2 pulses  Skin: Dry, warm to touch, negative for any Rashes, No open wounds Wounds: per nursing documentation    ------------------------------------------------------------------------------------------------------------------------------------------    LABs:  CBC Latest Ref Rng & Units 01/18/2021 01/17/2021 01/06/2020  WBC 4.0 - 10.5 K/uL 1.3(LL) 2.5(L) 8.8  Hemoglobin 12.0 - 15.0 g/dL 11.4(L) 12.1 11.4(L)  Hematocrit 36.0 - 46.0 % 37.4 39.7 37.3  Platelets 150 - 400 K/uL 106(L) 108(L) 95(L)    CMP Latest Ref Rng & Units 01/18/2021 01/17/2021 01/05/2020  Glucose 70 - 99 mg/dL 147(W) 97 96  BUN 8 - 23 mg/dL 18 15 29(F)  Creatinine 0.44 - 1.00 mg/dL 6.21 3.08 6.57  Sodium 135 - 145 mmol/L 141 141 140  Potassium 3.5 - 5.1 mmol/L 5.2(H) 2.9(L) 4.0  Chloride 98 - 111 mmol/L 108 100 103  CO2 22 - 32 mmol/L 27 33(H) 29  Calcium 8.9 - 10.3 mg/dL 7.3(L) 7.6(L) 8.0(L)  Total Protein 6.5 - 8.1 g/dL 8.4(O) 6.1(L) -  Total Bilirubin 0.3 - 1.2 mg/dL 0.7 1.0 -  Alkaline Phos 38 - 126 U/L 41 40 -  AST 15 - 41 U/L 27 26 -  ALT 0 - 44 U/L 9 9 -       Micro Results Recent Results (from the past 240 hour(s))  Resp Panel by RT-PCR (Flu A&B, Covid) Nasopharyngeal Swab     Status: Abnormal   Collection Time: 01/17/21  8:56 AM   Specimen: Nasopharyngeal Swab; Nasopharyngeal(NP) swabs in vial transport medium  Result Value Ref Range Status   SARS Coronavirus 2 by RT PCR POSITIVE (A) NEGATIVE Final    Comment: CRITICAL RESULT CALLED TO, READ BACK BY AND VERIFIED WITH: MYRICK,B AT 0959 ON 8.17.22 BY RUCINSKI,B (NOTE) SARS-CoV-2 target nucleic acids are DETECTED.  The SARS-CoV-2 RNA is generally detectable in upper respiratory specimens during the acute phase of infection. Positive results are indicative of the presence of the identified virus, but do not rule out bacterial infection or co-infection with other pathogens not detected by the test. Clinical correlation with patient history and other diagnostic information is necessary to determine patient infection status. The expected result is Negative.  Fact Sheet for Patients: BloggerCourse.com  Fact Sheet for Healthcare Providers: SeriousBroker.it  This test is not yet approved or cleared by the [place] FDA and  has been authorized for detection and/or diagnosis of SARS-CoV-2 by FDA under an Emergency Use Authorization (EUA).  This EUA will remain in effect (meaning  this test can  be used) for the duration of  the COVID-19 declaration under Section 564(b)(1) of the Act, 21 U.S.C. section 360bbb-3(b)(1), unless the authorization is terminated or revoked sooner.     Influenza A by PCR NEGATIVE NEGATIVE Final   Influenza B by PCR NEGATIVE NEGATIVE Final    Comment: (NOTE) The Xpert Xpress SARS-CoV-2/FLU/RSV plus assay is intended as an aid in the diagnosis of influenza from Nasopharyngeal swab specimens and should not be used as a sole basis for treatment. Nasal  washings and aspirates are unacceptable for Xpert Xpress SARS-CoV-2/FLU/RSV testing.  Fact Sheet for Patients: BloggerCourse.com  Fact Sheet for Healthcare Providers: SeriousBroker.it  This test is not yet approved or cleared by the [place] FDA and has been authorized for detection and/or diagnosis of SARS-CoV-2 by FDA under an Emergency Use Authorization (EUA). This EUA will remain in effect (meaning this test can be used) for the duration of the COVID-19 declaration under Section 564(b)(1) of the Act, 21 U.S.C. section 360bbb-3(b)(1), unless the authorization is terminated or revoked.  Performed at [facility], [address], [place], [place] [address]   Blood Culture (routine x 2)     Status: None (Preliminary result)   Collection Time: 01/17/21  9:16 AM   Specimen: Left Antecubital; Blood  Result Value Ref Range Status   Specimen Description LEFT ANTECUBITAL  Final   Special Requests   Final    BOTTLES DRAWN AEROBIC AND ANAEROBIC Blood Culture adequate volume   Culture   Final    NO GROWTH < 24 HOURS Performed at [facility], [address] [place], [place] [address]    Report Status PENDING  Incomplete  Blood Culture (routine x 2)     Status: None (Preliminary result)   Collection Time: 01/17/21  9:19 AM   Specimen: Right Antecubital; Blood  Result Value Ref Range Status   Specimen Description RIGHT ANTECUBITAL  Final   Special  Requests   Final    BOTTLES DRAWN AEROBIC AND ANAEROBIC Blood Culture adequate volume   Culture   Final    NO GROWTH < 24 HOURS Performed at [facility], [address], [place], [place] [address]    Report Status PENDING  Incomplete    Radiology Reports DG Chest Port 1 View  Result Date: 01/17/2021 CLINICAL DATA:  Weakness and hypoxia.  COVID positive. EXAM: PORTABLE CHEST 1 VIEW COMPARISON:  Chest x-ray dated January 04, 2020. FINDINGS: Normal heart size with unchanged aneurysmal thoracic aorta. Chronic interstitial coarsening with new mild interstitial thickening at the peripheral left lung base. No focal consolidation, pleural effusion, or pneumothorax. No acute osseous abnormality. IMPRESSION: 1. New mild interstitial thickening at the peripheral left lung base may reflect atypical infection given clinical history. Electronically Signed   By: Obie Dredge M.D.   On: 01/17/2021 09:02    SIGNED: Kendell Bane, MD, FHM. [facility],  Pager (please use [email or web address] to page/text) Please use Epic Secure Chat for non-urgent communication (7AM-7PM)  If 7PM-7AM, please contact night-coverage [email or web address], 01/18/2021, 11:44 AM

## 2021-01-19 DIAGNOSIS — I10 Essential (primary) hypertension: Secondary | ICD-10-CM | POA: Diagnosis not present

## 2021-01-19 DIAGNOSIS — J9601 Acute respiratory failure with hypoxia: Secondary | ICD-10-CM

## 2021-01-19 DIAGNOSIS — J1282 Pneumonia due to coronavirus disease 2019: Secondary | ICD-10-CM | POA: Diagnosis not present

## 2021-01-19 DIAGNOSIS — U071 COVID-19: Secondary | ICD-10-CM | POA: Diagnosis not present

## 2021-01-19 LAB — FERRITIN: Ferritin: 333 ng/mL — ABNORMAL HIGH (ref 11–307)

## 2021-01-19 LAB — COMPREHENSIVE METABOLIC PANEL
ALT: 10 U/L (ref 0–44)
AST: 26 U/L (ref 15–41)
Albumin: 2.6 g/dL — ABNORMAL LOW (ref 3.5–5.0)
Alkaline Phosphatase: 43 U/L (ref 38–126)
Anion gap: 7 (ref 5–15)
BUN: 27 mg/dL — ABNORMAL HIGH (ref 8–23)
CO2: 27 mmol/L (ref 22–32)
Calcium: 7.8 mg/dL — ABNORMAL LOW (ref 8.9–10.3)
Chloride: 108 mmol/L (ref 98–111)
Creatinine, Ser: 0.61 mg/dL (ref 0.44–1.00)
GFR, Estimated: >60 mL/min (ref >60)
Glucose, Bld: 114 mg/dL — ABNORMAL HIGH (ref 70–99)
Potassium: 4.1 mmol/L (ref 3.5–5.1)
Sodium: 142 mmol/L (ref 135–145)
Total Bilirubin: 0.6 mg/dL (ref 0.3–1.2)
Total Protein: 5.5 g/dL — ABNORMAL LOW (ref 6.5–8.1)

## 2021-01-19 LAB — CBC WITH DIFFERENTIAL/PLATELET
Abs Immature Granulocytes: 0.02 10*3/uL (ref 0.00–0.07)
Basophils Absolute: 0 10*3/uL (ref 0.0–0.1)
Basophils Relative: 0 %
Eosinophils Absolute: 0 10*3/uL (ref 0.0–0.5)
Eosinophils Relative: 0 %
HCT: 38 % (ref 36.0–46.0)
Hemoglobin: 11.5 g/dL — ABNORMAL LOW (ref 12.0–15.0)
Immature Granulocytes: 0 %
Lymphocytes Relative: 11 %
Lymphs Abs: 0.5 10*3/uL — ABNORMAL LOW (ref 0.7–4.0)
MCH: 30 pg (ref 26.0–34.0)
MCHC: 30.3 g/dL (ref 30.0–36.0)
MCV: 99.2 fL (ref 80.0–100.0)
Monocytes Absolute: 0.3 10*3/uL (ref 0.1–1.0)
Monocytes Relative: 6 %
Neutro Abs: 3.9 10*3/uL (ref 1.7–7.7)
Neutrophils Relative %: 83 %
Platelets: 150 10*3/uL (ref 150–400)
RBC: 3.83 MIL/uL — ABNORMAL LOW (ref 3.87–5.11)
RDW: 14.7 % (ref 11.5–15.5)
WBC: 4.7 10*3/uL (ref 4.0–10.5)
nRBC: 0 % (ref 0.0–0.2)

## 2021-01-19 LAB — PHOSPHORUS: Phosphorus: 2.6 mg/dL (ref 2.5–4.6)

## 2021-01-19 LAB — D-DIMER, QUANTITATIVE: D-Dimer, Quant: 2.25 ug/mL-FEU — ABNORMAL HIGH (ref 0.00–0.50)

## 2021-01-19 LAB — C-REACTIVE PROTEIN: CRP: 5.3 mg/dL — ABNORMAL HIGH (ref <1.0)

## 2021-01-19 LAB — MAGNESIUM: Magnesium: 1.8 mg/dL (ref 1.7–2.4)

## 2021-01-19 MED ORDER — METOPROLOL TARTRATE 25 MG PO TABS
25.0000 mg | ORAL_TABLET | Freq: Two times a day (BID) | ORAL | Status: DC
  Administered 2021-01-19 – 2021-01-20 (×3): 25 mg via ORAL
  Filled 2021-01-19 (×3): qty 1

## 2021-01-19 MED ORDER — METHYLPREDNISOLONE SODIUM SUCC 125 MG IJ SOLR
80.0000 mg | INTRAMUSCULAR | Status: DC
  Administered 2021-01-19: 80 mg via INTRAVENOUS
  Filled 2021-01-19: qty 2

## 2021-01-19 MED ORDER — FLORANEX PO PACK
1.0000 g | PACK | Freq: Three times a day (TID) | ORAL | Status: DC
  Administered 2021-01-19 – 2021-01-20 (×4): 1 g via ORAL
  Filled 2021-01-19 (×10): qty 1

## 2021-01-19 MED ORDER — GUAIFENESIN-DM 100-10 MG/5ML PO SYRP
10.0000 mL | ORAL_SOLUTION | Freq: Three times a day (TID) | ORAL | Status: DC
  Administered 2021-01-19 – 2021-01-20 (×4): 10 mL via ORAL
  Filled 2021-01-19 (×4): qty 10

## 2021-01-19 NOTE — Care Management Important Message (Signed)
Important Message  Patient Details  Name: Denise Mendoza MRN: [id number] Date of Birth: 10-20-1931   Medicare Important Message Given:  Yes (RN Amber agreed to deliver due to contact precautions)     Corey Harold 01/19/2021, 3:41 PM

## 2021-01-19 NOTE — Progress Notes (Signed)
PROGRESS NOTE    Patient: Denise Mendoza                            PCP: Ignatius Specking, MD                    DOB: 01-02-1932            DOA: 01/17/2021 IWP:[id number]             DOS: 01/19/2021, 11:42 AM   LOS: 2 days   Date of Service: The patient was seen and examined on 01/19/2021  Subjective:   The patient was seen and examined this morning, resting comfortably in bed, in no acute distress, currently satting 96% on room air.  But still complaining shortness of breath and cough with minimal exertion. Complaint of generalized weaknesses  Brief Narrative:    GAIA GULLIKSON is a 87 y.o. female with medical history significant of hypertension, hyperlipidemia, tobacco abuse and emphysema; who presented to the hospital secondary to shortness of breath, general malaise and nonproductive cough and spells.  Patient lives at home by herself and expressed not being vaccinated against COVID.  She denies any nausea, vomiting, chest pain, abdominal pain, dysuria, hematuria, melena, hematochezia or any other complaints. Respiratory symptoms and general malaise have been present for the last 4 days and worsening.  On EMS arrival of the patient's property she was found to be hypoxic at 86% on room air.  Patient did no use oxygen at baseline.   ED Course: Chest x-ray ordered and demonstrating atypical infiltrates and emphysematous changes.  Positive COVID by PCR.  TRH has been consulted to place patient in the hospital for further evaluation and management.    Assessment & Plan:   Active Problems:   Hypokalemia   Respiratory failure with hypoxia (HCC)   Essential hypertension   Pneumonia due to COVID-19 virus    Acute respiratory failure with hypoxia due to COVID-pneumonia -Awake alert, shortness of breath and cough with exertion -She has been successfully weaned off 2 L of oxygen, currently satting 96% on room air  -Positive atypical infiltrates appreciated on x-ray; patient hypoxic on  room air with oxygen saturation in the 86% (reported by EMS). -Elevated inflammatory markers-continue to improve -Continue IV remdesivir, steroids --- will taper down If continues to improve, will discontinue remdesivir and steroids in a.m. and plan for discharge  -Flutter valve, antitussive medication, bronchodilators and supportive care will be provided. -Patient is now vaccinated against COVID. -Vitamin C and vitamin C    Hyperlipidemia -Has not been taking statins at home, will continue heart healthy diet -Will need close outpatient follow-up with PCP to restart management. -Holding statins presumptions currently the patient has been started on remdesivir and there is a chance for transaminitis to call.   GERD -continue PPI -Stable    HTN -Monitoring BP 144/55 this a.m. -Will resume home medication of Lopressor    physical deconditioning -In part secondary to acute COVID infection -Pending PT OT evaluation recommendation   History of anxiety-chronic pain  -Resuming home medication of Xanax and tramadol as needed -Remained stable tolerating Xanax and as needed tramadol   hypokalemia -Monitoring, repleting   Tobacco abuse -Stating she continues to smoke at home -Cessation counseling has been provided. -Currently declining the use of nicotine patch    Acute leukopenia -WBC dropped to 2.5 >> 1.3 >> 4.7 today ... Likely due to COVID infection, possible  remdesivir On steroids, will monitor closely   ----------------------------------------------------------------------------------------------------------------------------------------------- Cultures; Blood Cultures x 2 >> NGT  Sputum Culture >> could not be obtained  Antimicrobials: None   Consultants: none    ------------------------------------------------------------------------------------------------------------------------------------------------  DVT prophylaxis:  SCD/Compression stockings and  Lovenox SQ Code Status:   Code Status: Full Code  Family Communication: No family member present at bedside- attempt will be made to update daily The above findings and plan of care has been discussed with patient (and family)  in detail,  they expressed understanding and agreement of above. -Advance care planning has been discussed.   Admission status:   Status is: Inpatient  Remains inpatient appropriate because:Hemodynamically unstable, IV treatments appropriate due to intensity of illness or inability to take PO, and Inpatient level of care appropriate due to severity of illness  Dispo: The patient is from: Home              Anticipated d/c is to: Home              Patient currently is not medically stable to d/c.Anticipated DC barriers:Stabilization of acute hypoxemic respiratory failure due to COVID-pneumonia.     Difficult to place patient No      Level of care: Med-Surg   Procedures:   No admission procedures for hospital encounter.    Antimicrobials:  Anti-infectives (From admission, onward)    Start     Dose/Rate Route Frequency Ordered Stop   01/18/21 1000  remdesivir 100 mg in sodium chloride 0.9 % 100 mL IVPB  Status:  Discontinued       See Hyperspace for full Linked Orders Report.   100 mg 200 mL/hr over 30 Minutes Intravenous Daily 01/17/21 1231 01/17/21 1241   01/18/21 1000  remdesivir 100 mg in sodium chloride 0.9 % 100 mL IVPB        100 mg 200 mL/hr over 30 Minutes Intravenous Daily 01/17/21 1241 01/22/21 0959   01/17/21 1300  remdesivir 100 mg in sodium chloride 0.9 % 100 mL IVPB        100 mg 200 mL/hr over 30 Minutes Intravenous Every 1 hr x 2 01/17/21 1241 01/17/21 1828   01/17/21 1230  remdesivir 200 mg in sodium chloride 0.9% 250 mL IVPB  Status:  Discontinued       See Hyperspace for full Linked Orders Report.   200 mg 580 mL/hr over 30 Minutes Intravenous Once 01/17/21 1231 01/17/21 1241        Medication:   vitamin C  500 mg Oral  Daily   enoxaparin (LOVENOX) injection  40 mg Subcutaneous Q24H   feeding supplement  237 mL Oral TID BM   guaiFENesin-dextromethorphan  10 mL Oral Q8H   lactobacillus  1 g Oral TID WC   methylPREDNISolone (SOLU-MEDROL) injection  80 mg Intravenous Q24H   pantoprazole  40 mg Oral Daily   zinc sulfate  220 mg Oral Daily    acetaminophen, albuterol, ALPRAZolam, chlorpheniramine-HYDROcodone, Ipratropium-Albuterol, ondansetron **OR** ondansetron (ZOFRAN) IV, traMADol   Objective:   Vitals:   01/18/21 1453 01/18/21 2047 01/18/21 2115 01/19/21 0527  BP:   (!) 100/59 (!) 145/55  Pulse:   92 71  Resp:   16 17  Temp:   97.7 F (36.5 C) 97.7 F (36.5 C)  TempSrc:   Oral Oral  SpO2: 93% 94% 93% 96%  Weight:      Height:        Intake/Output Summary (Last 24 hours) at 01/19/2021 1142 Last data filed at  01/19/2021 0900 Gross per 24 hour  Intake 420 ml  Output --  Net 420 ml   Filed Weights   01/17/21 0807 01/17/21 1607  Weight: 46.3 kg 46.2 kg     Examination:     Physical Exam:   General:  Alert, oriented, cooperative, calm shortness of breath with exertion  HEENT:  Normocephalic, PERRL, otherwise with in Normal limits   Neuro:  CNII-XII intact. , normal motor and sensation, reflexes intact   Lungs:   Clear to auscultation BL, Respirations unlabored, no wheezes / crackles  Cardio:    S1/S2, RRR, No murmure, No Rubs or Gallops   Abdomen:   Soft, non-tender, bowel sounds active all four quadrants,  no guarding or peritoneal signs.  Muscular skeletal:  Global generalized weakness, Limited exam - in bed, able to move all 4 extremities, Normal strength,  2+ pulses,  symmetric, No pitting edema  Skin:  Dry, warm to touch, negative for any Rashes,  Wounds: Please see nursing documentation         ------------------------------------------------------------------------------------------------------------------------------------------    LABs:  CBC Latest Ref Rng & Units  01/19/2021 01/18/2021 01/17/2021  WBC 4.0 - 10.5 K/uL 4.7 1.3(LL) 2.5(L)  Hemoglobin 12.0 - 15.0 g/dL 11.5(L) 11.4(L) 12.1  Hematocrit 36.0 - 46.0 % 38.0 37.4 39.7  Platelets 150 - 400 K/uL 150 106(L) 108(L)   CMP Latest Ref Rng & Units 01/19/2021 01/18/2021 01/17/2021  Glucose 70 - 99 mg/dL 811(B) 147(W) 97  BUN 8 - 23 mg/dL 29(F) 18 15  Creatinine 0.44 - 1.00 mg/dL 6.21 3.08 6.57  Sodium 135 - 145 mmol/L 142 141 141  Potassium 3.5 - 5.1 mmol/L 4.1 5.2(H) 2.9(L)  Chloride 98 - 111 mmol/L 108 108 100  CO2 22 - 32 mmol/L 27 27 33(H)  Calcium 8.9 - 10.3 mg/dL 7.8(L) 7.3(L) 7.6(L)  Total Protein 6.5 - 8.1 g/dL 8.4(O) 9.6(E) 6.1(L)  Total Bilirubin 0.3 - 1.2 mg/dL 0.6 0.7 1.0  Alkaline Phos 38 - 126 U/L 43 41 40  AST 15 - 41 U/L ALT 0 - 44 U/L Micro Results Recent Results (from the past 240 hour(s))  Resp Panel by RT-PCR (Flu A&B, Covid) Nasopharyngeal Swab     Status: Abnormal   Collection Time: 01/17/21  8:56 AM   Specimen: Nasopharyngeal Swab; Nasopharyngeal(NP) swabs in vial transport medium  Result Value Ref Range Status   SARS Coronavirus 2 by RT PCR POSITIVE (A) NEGATIVE Final    Comment: CRITICAL RESULT CALLED TO, READ BACK BY AND VERIFIED WITH: MYRICK,B AT 0959 ON 8.17.22 BY RUCINSKI,B (NOTE) SARS-CoV-2 target nucleic acids are DETECTED.  The SARS-CoV-2 RNA is generally detectable in upper respiratory specimens during the acute phase of infection. Positive results are indicative of the presence of the identified virus, but do not rule out bacterial infection or co-infection with other pathogens not detected by the test. Clinical correlation with patient history and other diagnostic information is necessary to determine patient infection status. The expected result is Negative.  Fact Sheet for Patients: BloggerCourse.com  Fact Sheet for Healthcare Providers: SeriousBroker.it  This test is not yet  approved or cleared by the [place] FDA and  has been authorized for detection and/or diagnosis of SARS-CoV-2 by FDA under an Emergency Use Authorization (EUA).  This EUA will remain in effect (meaning  this test can be used) for the duration of  the COVID-19 declaration under Section 564(b)(1) of the Act,  21 U.S.C. section 360bbb-3(b)(1), unless the authorization is terminated or revoked sooner.     Influenza A by PCR NEGATIVE NEGATIVE Final   Influenza B by PCR NEGATIVE NEGATIVE Final    Comment: (NOTE) The Xpert Xpress SARS-CoV-2/FLU/RSV plus assay is intended as an aid in the diagnosis of influenza from Nasopharyngeal swab specimens and should not be used as a sole basis for treatment. Nasal washings and aspirates are unacceptable for Xpert Xpress SARS-CoV-2/FLU/RSV testing.  Fact Sheet for Patients: BloggerCourse.com  Fact Sheet for Healthcare Providers: SeriousBroker.it  This test is not yet approved or cleared by the [place] FDA and has been authorized for detection and/or diagnosis of SARS-CoV-2 by FDA under an Emergency Use Authorization (EUA). This EUA will remain in effect (meaning this test can be used) for the duration of the COVID-19 declaration under Section 564(b)(1) of the Act, 21 U.S.C. section 360bbb-3(b)(1), unless the authorization is terminated or revoked.  Performed at [facility], [address] [place], [place] [address]   Blood Culture (routine x 2)     Status: None (Preliminary result)   Collection Time: 01/17/21  9:16 AM   Specimen: Left Antecubital; Blood  Result Value Ref Range Status   Specimen Description LEFT ANTECUBITAL  Final   Special Requests   Final    BOTTLES DRAWN AEROBIC AND ANAEROBIC Blood Culture adequate volume   Culture   Final    NO GROWTH 2 DAYS Performed at [facility], [address], [place], [place] [address]    Report Status PENDING  Incomplete  Blood Culture  (routine x 2)     Status: None (Preliminary result)   Collection Time: 01/17/21  9:19 AM   Specimen: Right Antecubital; Blood  Result Value Ref Range Status   Specimen Description RIGHT ANTECUBITAL  Final   Special Requests   Final    BOTTLES DRAWN AEROBIC AND ANAEROBIC Blood Culture adequate volume   Culture   Final    NO GROWTH 2 DAYS Performed at [facility], [address] [place], [place] [address]    Report Status PENDING  Incomplete    Radiology Reports DG Chest Port 1 View  Result Date: 01/17/2021 CLINICAL DATA:  Weakness and hypoxia.  COVID positive. EXAM: PORTABLE CHEST 1 VIEW COMPARISON:  Chest x-ray dated January 04, 2020. FINDINGS: Normal heart size with unchanged aneurysmal thoracic aorta. Chronic interstitial coarsening with new mild interstitial thickening at the peripheral left lung base. No focal consolidation, pleural effusion, or pneumothorax. No acute osseous abnormality. IMPRESSION: 1. New mild interstitial thickening at the peripheral left lung base may reflect atypical infection given clinical history. Electronically Signed   By: Obie Dredge M.D.   On: 01/17/2021 09:02    SIGNED: Kendell Bane, MD, FHM. [facility],  Pager (please use [email or web address] to page/text) Please use Epic Secure Chat for non-urgent communication (7AM-7PM)  If 7PM-7AM, please contact night-coverage [email or web address], 01/19/2021, 11:42 AM

## 2021-01-20 DIAGNOSIS — E876 Hypokalemia: Secondary | ICD-10-CM | POA: Diagnosis not present

## 2021-01-20 DIAGNOSIS — J9601 Acute respiratory failure with hypoxia: Secondary | ICD-10-CM | POA: Diagnosis not present

## 2021-01-20 DIAGNOSIS — I10 Essential (primary) hypertension: Secondary | ICD-10-CM | POA: Diagnosis not present

## 2021-01-20 DIAGNOSIS — U071 COVID-19: Secondary | ICD-10-CM | POA: Diagnosis not present

## 2021-01-20 LAB — CBC WITH DIFFERENTIAL/PLATELET
Abs Immature Granulocytes: 0.08 10*3/uL — ABNORMAL HIGH (ref 0.00–0.07)
Basophils Absolute: 0 10*3/uL (ref 0.0–0.1)
Basophils Relative: 0 %
Eosinophils Absolute: 0 10*3/uL (ref 0.0–0.5)
Eosinophils Relative: 0 %
HCT: 37.7 % (ref 36.0–46.0)
Hemoglobin: 11.5 g/dL — ABNORMAL LOW (ref 12.0–15.0)
Immature Granulocytes: 1 %
Lymphocytes Relative: 9 %
Lymphs Abs: 0.5 10*3/uL — ABNORMAL LOW (ref 0.7–4.0)
MCH: 29.9 pg (ref 26.0–34.0)
MCHC: 30.5 g/dL (ref 30.0–36.0)
MCV: 97.9 fL (ref 80.0–100.0)
Monocytes Absolute: 0.4 10*3/uL (ref 0.1–1.0)
Monocytes Relative: 7 %
Neutro Abs: 4.9 10*3/uL (ref 1.7–7.7)
Neutrophils Relative %: 83 %
Platelets: 155 10*3/uL (ref 150–400)
RBC: 3.85 MIL/uL — ABNORMAL LOW (ref 3.87–5.11)
RDW: 14.6 % (ref 11.5–15.5)
WBC: 6 10*3/uL (ref 4.0–10.5)
nRBC: 0 % (ref 0.0–0.2)

## 2021-01-20 LAB — COMPREHENSIVE METABOLIC PANEL
ALT: 13 U/L (ref 0–44)
AST: 23 U/L (ref 15–41)
Albumin: 2.5 g/dL — ABNORMAL LOW (ref 3.5–5.0)
Alkaline Phosphatase: 42 U/L (ref 38–126)
Anion gap: 7 (ref 5–15)
BUN: 27 mg/dL — ABNORMAL HIGH (ref 8–23)
CO2: 30 mmol/L (ref 22–32)
Calcium: 8.4 mg/dL — ABNORMAL LOW (ref 8.9–10.3)
Chloride: 105 mmol/L (ref 98–111)
Creatinine, Ser: 0.6 mg/dL (ref 0.44–1.00)
GFR, Estimated: >60 mL/min (ref >60)
Glucose, Bld: 118 mg/dL — ABNORMAL HIGH (ref 70–99)
Potassium: 4.3 mmol/L (ref 3.5–5.1)
Sodium: 142 mmol/L (ref 135–145)
Total Bilirubin: 0.6 mg/dL (ref 0.3–1.2)
Total Protein: 5.4 g/dL — ABNORMAL LOW (ref 6.5–8.1)

## 2021-01-20 LAB — PHOSPHORUS: Phosphorus: 2.7 mg/dL (ref 2.5–4.6)

## 2021-01-20 LAB — FERRITIN: Ferritin: 306 ng/mL (ref 11–307)

## 2021-01-20 LAB — C-REACTIVE PROTEIN: CRP: 2.7 mg/dL — ABNORMAL HIGH (ref <1.0)

## 2021-01-20 LAB — MAGNESIUM: Magnesium: 1.8 mg/dL (ref 1.7–2.4)

## 2021-01-20 LAB — D-DIMER, QUANTITATIVE: D-Dimer, Quant: 2.26 ug/mL-FEU — ABNORMAL HIGH (ref 0.00–0.50)

## 2021-01-20 MED ORDER — IPRATROPIUM-ALBUTEROL 20-100 MCG/ACT IN AERS: 1.0000 | INHALATION_SPRAY | Freq: Four times a day (QID) | RESPIRATORY_TRACT | 2 refills | Status: AC | PRN

## 2021-01-20 MED ORDER — GUAIFENESIN-DM 100-10 MG/5ML PO SYRP: 10.0000 mL | ORAL_SOLUTION | Freq: Three times a day (TID) | ORAL | 0 refills | Status: AC

## 2021-01-20 MED ORDER — METHYLPREDNISOLONE 4 MG PO TBPK: ORAL_TABLET | ORAL | 0 refills | Status: DC

## 2021-01-20 MED ORDER — METHYLPREDNISOLONE SODIUM SUCC 40 MG IJ SOLR
40.0000 mg | INTRAMUSCULAR | Status: DC
  Administered 2021-01-20: 40 mg via INTRAVENOUS
  Filled 2021-01-20: qty 1

## 2021-01-20 MED ORDER — SIMVASTATIN 40 MG PO TABS: 40.0000 mg | ORAL_TABLET | Freq: Every evening | ORAL | 1 refills | Status: AC

## 2021-01-20 MED ORDER — ASCORBIC ACID 500 MG PO TABS: 500.0000 mg | ORAL_TABLET | Freq: Every day | ORAL | 0 refills | Status: AC

## 2021-01-20 MED ORDER — METOPROLOL TARTRATE 25 MG PO TABS: 25.0000 mg | ORAL_TABLET | Freq: Two times a day (BID) | ORAL | 2 refills | Status: DC

## 2021-01-20 MED ORDER — ZINC SULFATE 220 (50 ZN) MG PO CAPS: 220.0000 mg | ORAL_CAPSULE | Freq: Every day | ORAL | 0 refills | Status: AC

## 2021-01-20 NOTE — Discharge Summary (Signed)
Physician Discharge Summary [facility]    Patient: Denise Mendoza                   Admit date: 01/17/2021   DOB: 1931/07/21             Discharge date:01/20/2021/12:17 PM ZOX:[id number]                          PCP: Ignatius Specking, MD  Disposition: HOME with Home Health   Recommendations for Outpatient Follow-up:   Follow up: With primary care doctor in 2-3 weeks -Continue isolation for total of 10 days from symptoms and positive COVID test Continue inhalers, follow medication instructions tapered down steroids  Discharge Condition: Stable   Code Status:   Code Status: Full Code  Diet recommendation: Cardiac diet   Discharge Diagnoses:    Active Problems:   Hypokalemia   Respiratory failure with hypoxia (HCC)   Essential hypertension   Pneumonia due to COVID-19 virus   History of Present Illness/ Hospital Course Charline Bills Summary:    TARAH BUBOLTZ is a 87 y.o. female with medical history significant of hypertension, hyperlipidemia, tobacco abuse and emphysema; who presented to the hospital secondary to shortness of breath, general malaise and nonproductive cough and spells.  Patient lives at home by herself and expressed not being vaccinated against COVID.  She denies any nausea, vomiting, chest pain, abdominal pain, dysuria, hematuria, melena, hematochezia or any other complaints. Respiratory symptoms and general malaise have been present for the last 4 days and worsening.  On EMS arrival of the patient's property she was found to be hypoxic at 86% on room air.  Patient did no use oxygen at baseline.   ED Course: Chest x-ray ordered and demonstrating atypical infiltrates and emphysematous changes.  Positive COVID by PCR.  TRH has been consulted to place patient in the hospital for further evaluation and management.        Acute respiratory failure with hypoxia due to COVID-pneumonia -Resolved -Continue to maintain O2 sat greater 90% room air in past 24  hours -She has been successfully weaned off 2 L of oxygen, currently satting 96% on room air -Positive atypical infiltrates appreciated on x-ray; patient hypoxic on room air with oxygen saturation in the 86% (reported by EMS). -Elevated inflammatory markers-continue to improve -Continue IV remdesivir, steroids --- will taper down --- completed 3 days course 01/20/2021 -Prescription for Medrol Dosepak given -Continue Combivent bronchodilators every 6 hours as needed -Patient is now vaccinated against COVID. -Vitamin C and vitamin C    Hyperlipidemia -Has not been taking statins at home, will continue heart healthy diet -Will need close outpatient follow-up with PCP to restart management. -Holding statins presumptions currently the patient has been started on remdesivir and there is a chance for transaminitis to call.   GERD -continue PPI    HTN -Monitoring BP 144/55 this a.m. -Will resume home medication of Lopressor     physical deconditioning -In part secondary to acute COVID infection -Pt to to evaluate-Home health arranged   History of anxiety-chronic pain  -Resuming home medication of Xanax and tramadol as needed -Remained stable tolerating Xanax and as needed tramadol     hypokalemia -Monitoring, repleting   Tobacco abuse -Stating she continues to smoke at home -Cessation counseling has been provided. -Currently declining the use of nicotine patch     Acute leukopenia -WBC dropped to 2.5 >> 1.3 >> 4.7 ... Likely due  to COVID infection, possible remdesivir On steroids, will monitor closely     ----------------------------------------------------------------------------------------------------------------------------------------------- Cultures; Blood Cultures x 2 >> NGT   Sputum Culture >> could not be obtained   Antimicrobials: None     Consultants: none   --------------------------------------------------------------------------------------------------------------------------------------------    Code Status:   Code Status: Full Code    Disposition:  the patient is from: Home              Anticipated d/c is to: Home with home health     Discharge Instructions:   Discharge Instructions     Activity as tolerated - No restrictions   Complete by: As directed    Diet - low sodium heart healthy   Complete by: As directed    Discharge instructions   Complete by: As directed    Following a PCP in 1 to 2 weeks, continue isolation for 5 more days Continue current medication recommended taper down steroids...   Increase activity slowly   Complete by: As directed         Medication List     STOP taking these medications    cyanocobalamin 1000 MCG/ML injection Commonly known as: (VITAMIN B-12)   docusate sodium 100 MG capsule Commonly known as: COLACE   ibuprofen 800 MG tablet Commonly known as: ADVIL   predniSONE 10 MG (48) Tbpk tablet Commonly known as: STERAPRED UNI-PAK 48 TAB       TAKE these medications    ALPRAZolam 0.5 MG tablet Commonly known as: XANAX Take 1 mg by mouth 2 (two) times daily.   amitriptyline 10 MG tablet Commonly known as: ELAVIL TAKE ONE TABLET BY MOUTH AT BEDTIME   ascorbic acid 500 MG tablet Commonly known as: VITAMIN C Take 1 tablet (500 mg total) by mouth daily for 5 days. Start taking on: January 21, 2021   aspirin 325 MG EC tablet Take 1 tablet (325 mg total) by mouth daily with breakfast.   guaiFENesin-dextromethorphan 100-10 MG/5ML syrup Commonly known as: ROBITUSSIN DM Take 10 mLs by mouth every 8 (eight) hours.   Ipratropium-Albuterol 20-100 MCG/ACT Aers respimat Commonly known as: COMBIVENT Inhale 1 puff into the lungs every 6 (six) hours as needed for wheezing or shortness of breath.   methylPREDNISolone 4 MG Tbpk tablet Commonly known as: MEDROL DOSEPAK Medrol  Dosepak take as instructed   metoprolol tartrate 25 MG tablet Commonly known as: LOPRESSOR Take 1 tablet (25 mg total) by mouth 2 (two) times daily.   simvastatin 40 MG tablet Commonly known as: ZOCOR Take 1 tablet (40 mg total) by mouth every evening.   traMADol 50 MG tablet Commonly known as: ULTRAM Take 50 mg by mouth 4 (four) times daily.   zinc sulfate 220 (50 Zn) MG capsule Take 1 capsule (220 mg total) by mouth daily for 5 days. Start taking on: January 21, 2021        No Known Allergies   Procedures /Studies:   DG Chest Port 1 View  Result Date: 01/17/2021 CLINICAL DATA:  Weakness and hypoxia.  COVID positive. EXAM: PORTABLE CHEST 1 VIEW COMPARISON:  Chest x-ray dated January 04, 2020. FINDINGS: Normal heart size with unchanged aneurysmal thoracic aorta. Chronic interstitial coarsening with new mild interstitial thickening at the peripheral left lung base. No focal consolidation, pleural effusion, or pneumothorax. No acute osseous abnormality. IMPRESSION: 1. New mild interstitial thickening at the peripheral left lung base may reflect atypical infection given clinical history. Electronically Signed   By: Vickki Hearing.D.  On: 01/17/2021 09:02    Subjective:   Patient was seen and examined 01/20/2021, 12:17 PM Patient stable today. No acute distress.  No issues overnight Stable for discharge.  Discharge Exam:    Vitals:   01/19/21 0527 01/19/21 1418 01/19/21 1958 01/20/21 0404  BP: (!) 145/55 (!) 104/56 124/70 (!) 127/58  Pulse: 71 99 72 64  Resp: 17 18 18 18   Temp: 97.7 F (36.5 C) 98.6 F (37 C) 98.1 F (36.7 C) 98 F (36.7 C)  TempSrc: Oral     SpO2: 96% 96% 95% 94%  Weight:      Height:        General: Pt lying comfortably in bed & appears in no obvious distress. Cardiovascular: S1 & S2 heard, RRR, S1/S2 +. No murmurs, rubs, gallops or clicks. No JVD or pedal edema. Respiratory: Clear to auscultation without wheezing, rhonchi or crackles. No  increased work of breathing. Abdominal:  Non-distended, non-tender & soft. No organomegaly or masses appreciated. Normal bowel sounds heard. CNS: Alert and oriented. No focal deficits. Extremities: no edema, no cyanosis      The results of significant diagnostics from this hospitalization (including imaging, microbiology, ancillary and laboratory) are listed below for reference.      Microbiology:   Recent Results (from the past 240 hour(s))  Resp Panel by RT-PCR (Flu A&B, Covid) Nasopharyngeal Swab     Status: Abnormal   Collection Time: 01/17/21  8:56 AM   Specimen: Nasopharyngeal Swab; Nasopharyngeal(NP) swabs in vial transport medium  Result Value Ref Range Status   SARS Coronavirus 2 by RT PCR POSITIVE (A) NEGATIVE Final    Comment: CRITICAL RESULT CALLED TO, READ BACK BY AND VERIFIED WITH: MYRICK,B AT 0959 ON 8.17.22 BY RUCINSKI,B (NOTE) SARS-CoV-2 target nucleic acids are DETECTED.  The SARS-CoV-2 RNA is generally detectable in upper respiratory specimens during the acute phase of infection. Positive results are indicative of the presence of the identified virus, but do not rule out bacterial infection or co-infection with other pathogens not detected by the test. Clinical correlation with patient history and other diagnostic information is necessary to determine patient infection status. The expected result is Negative.  Fact Sheet for Patients: 06-15-2005  Fact Sheet for Healthcare Providers: BloggerCourse.com  This test is not yet approved or cleared by the [employer].[place] FDA and  has been authorized for detection and/or diagnosis of SARS-CoV-2 by FDA under an Emergency Use Authorization (EUA).  This EUA will remain in effect (meaning  this test can be used) for the duration of  the COVID-19 declaration under Section 564(b)(1) of the Act, 21 U.S.C. section 360bbb-3(b)(1), unless the authorization  is terminated or revoked sooner.     Influenza A by PCR NEGATIVE NEGATIVE Final   Influenza B by PCR NEGATIVE NEGATIVE Final    Comment: (NOTE) The Xpert Xpress SARS-CoV-2/FLU/RSV plus assay is intended as an aid in the diagnosis of influenza from Nasopharyngeal swab specimens and should not be used as a sole basis for treatment. Nasal washings and aspirates are unacceptable for Xpert Xpress SARS-CoV-2/FLU/RSV testing.  Fact Sheet for Patients: [place]  Fact Sheet for Healthcare Providers: BloggerCourse.com  This test is not yet approved or cleared by the [employer]ious[employer] FDA and has been authorized for detection and/or diagnosis of SARS-CoV-2 by FDA under an Emergency Use Authorization (EUA). This EUA will remain in effect (meaning this test can be used) for the duration of the COVID-19 declaration under Section 564(b)(1) of the Act, 21 U.S.C. section 360bbb-3(b)(1),  unless the authorization is terminated or revoked.  Performed at [facility], [address], [place], [place] [address]   Blood Culture (routine x 2)     Status: None (Preliminary result)   Collection Time: 01/17/21  9:16 AM   Specimen: Left Antecubital; Blood  Result Value Ref Range Status   Specimen Description LEFT ANTECUBITAL  Final   Special Requests   Final    BOTTLES DRAWN AEROBIC AND ANAEROBIC Blood Culture adequate volume   Culture   Final    NO GROWTH 3 DAYS Performed at [facility], [address] [place], [place] [address]    Report Status PENDING  Incomplete  Blood Culture (routine x 2)     Status: None (Preliminary result)   Collection Time: 01/17/21  9:19 AM   Specimen: Right Antecubital; Blood  Result Value Ref Range Status   Specimen Description RIGHT ANTECUBITAL  Final   Special Requests   Final    BOTTLES DRAWN AEROBIC AND ANAEROBIC Blood Culture adequate volume   Culture   Final    NO GROWTH 3 DAYS Performed at [facility], [address], [place], [place] [address]    Report Status PENDING  Incomplete     Labs:   CBC: Recent Labs  Lab 01/17/21 0917 01/18/21 0614 01/19/21 0544 01/20/21 0718  WBC 2.5* 1.3* 4.7 6.0  NEUTROABS 1.6* 1.0* 3.9 4.9  HGB 12.1 11.4* 11.5* 11.5*  HCT 39.7 37.4 38.0 37.7  MCV 98.8 98.4 99.2 97.9  PLT 108* 106* 150 155   Basic Metabolic Panel: Recent Labs  Lab 01/17/21 0917 01/18/21 0614 01/19/21 0544 01/20/21 0718  NA 141 141 142 142  K 2.9* 5.2* 4.1 4.3  CL 100 108 108 105  CO2 33* GLUCOSE 97 133* 114* 118*  BUN 15 18 27* 27*  CREATININE 0.65 0.49 0.61 0.60  CALCIUM 7.6* 7.3* 7.8* 8.4*  MG  --  1.7 1.8 1.8  PHOS  --  2.5 2.6 2.7   Liver Function Tests: Recent Labs  Lab 01/17/21 0917 01/18/21 0614 01/19/21 0544 01/20/21 0718  AST ALT ALKPHOS 40 41 43 42  BILITOT 1.0 0.7 0.6 0.6  PROT 6.1* 5.5* 5.5* 5.4*  ALBUMIN 3.0* 2.6* 2.6* 2.5*   BNP (last 3 results) No results for input(s): BNP in the last 8760 hours. Cardiac Enzymes: No results for input(s): CKTOTAL, CKMB, CKMBINDEX, TROPONINI in the last 168 hours. CBG: No results for input(s): GLUCAP in the last 168 hours. Hgb A1c No results for input(s): HGBA1C in the last 72 hours. Lipid Profile No results for input(s): CHOL, HDL, LDLCALC, TRIG, CHOLHDL, LDLDIRECT in the last 72 hours. Thyroid function studies No results for input(s): TSH, T4TOTAL, T3FREE, THYROIDAB in the last 72 hours.  Invalid input(s): FREET3 Anemia work up Recent Labs    01/19/21 0544 01/20/21 0718  FERRITIN 333* 306   Urinalysis    Component Value Date/Time   COLORURINE YELLOW 12/31/2019 2230   APPEARANCEUR CLEAR 12/31/2019 2230   LABSPEC 1.015 12/31/2019 2230   PHURINE 6.0 12/31/2019 2230   GLUCOSEU NEGATIVE 12/31/2019 2230   HGBUR MODERATE (A) 12/31/2019 2230   BILIRUBINUR NEGATIVE 12/31/2019 2230   KETONESUR 5 (A) 12/31/2019 2230   PROTEINUR >=300 (A) 12/31/2019 2230    NITRITE NEGATIVE 12/31/2019 2230   LEUKOCYTESUR SMALL (A) 12/31/2019 2230         Time coordinating discharge: Over 45 minutes  SIGNED:  Kendell Bane, MD, FACP, [facility]. [facility],  Please use [email or web address] to Page If 7PM-7AM, please contact night-coverage [email or web address] 01/20/2021, 12:17 PM

## 2021-01-20 NOTE — TOC Transition Note (Signed)
Transition of Care [facility]) - CM/SW Discharge Note   Patient Details  Name: Denise Mendoza MRN: [id number] Date of Birth: [birth year]  Transition of Care [facility]) CM/SW Contact:  Barry Brunner, LCSW Phone Number: 01/20/2021, 3:38 PM   Clinical Narrative:    CSW notified of patient's readiness for discharge and HH needs. Patient's son agreeable to [facility]. CSW place referral for [facility] with [facility]. [name] with [facility] agreeable to take patient. TOC signing off.    Final next level of care: Home w Home Health Services Barriers to Discharge: Barriers Resolved   Patient Goals and CMS Choice Patient states their goals for this hospitalization and ongoing recovery are:: Return [facility] CMS Medicare.gov Compare Post Acute Care list provided to:: Patient Choice offered to / list presented to : Patient  Discharge Placement                    Patient and family notified of of transfer: 01/20/21  Discharge Plan and Services                          HH Arranged: RN, PT [facility] Agency: [facility] Date [facility] Agency Contacted: 01/20/21 Time HH Agency Contacted: 1538 Representative spoke with at [facility] Agency: [name]  Social Determinants of Health (SDOH) Interventions     Readmission Risk Interventions No flowsheet data found.

## 2021-01-22 LAB — CULTURE, BLOOD (ROUTINE X 2)
Culture: NO GROWTH
Culture: NO GROWTH
Special Requests: ADEQUATE
Special Requests: ADEQUATE

## 2021-03-22 ENCOUNTER — Observation Stay (HOSPITAL_COMMUNITY): Payer: Medicare Other

## 2021-03-22 ENCOUNTER — Emergency Department (HOSPITAL_COMMUNITY): Payer: Medicare Other

## 2021-03-22 ENCOUNTER — Encounter (HOSPITAL_COMMUNITY): Payer: Self-pay | Admitting: *Deleted

## 2021-03-22 ENCOUNTER — Inpatient Hospital Stay (HOSPITAL_COMMUNITY)
Admission: EM | Admit: 2021-03-22 | Discharge: 2021-03-26 | DRG: 562 | Disposition: A | Discharge status: Home Health | Payer: Medicare Other | Attending: Internal Medicine | Admitting: Internal Medicine

## 2021-03-22 DIAGNOSIS — N390 Urinary tract infection, site not specified: Secondary | ICD-10-CM | POA: Diagnosis present

## 2021-03-22 DIAGNOSIS — E860 Dehydration: Secondary | ICD-10-CM | POA: Diagnosis present

## 2021-03-22 DIAGNOSIS — S52612A Displaced fracture of left ulna styloid process, initial encounter for closed fracture: Secondary | ICD-10-CM | POA: Diagnosis present

## 2021-03-22 DIAGNOSIS — F0394 Unspecified dementia, unspecified severity, with anxiety: Secondary | ICD-10-CM | POA: Diagnosis present

## 2021-03-22 DIAGNOSIS — I739 Peripheral vascular disease, unspecified: Secondary | ICD-10-CM | POA: Diagnosis present

## 2021-03-22 DIAGNOSIS — R5381 Other malaise: Secondary | ICD-10-CM | POA: Diagnosis not present

## 2021-03-22 DIAGNOSIS — M7989 Other specified soft tissue disorders: Secondary | ICD-10-CM | POA: Diagnosis not present

## 2021-03-22 DIAGNOSIS — Z8249 Family history of ischemic heart disease and other diseases of the circulatory system: Secondary | ICD-10-CM

## 2021-03-22 DIAGNOSIS — Z7982 Long term (current) use of aspirin: Secondary | ICD-10-CM

## 2021-03-22 DIAGNOSIS — E785 Hyperlipidemia, unspecified: Secondary | ICD-10-CM | POA: Diagnosis present

## 2021-03-22 DIAGNOSIS — S62109A Fracture of unspecified carpal bone, unspecified wrist, initial encounter for closed fracture: Secondary | ICD-10-CM

## 2021-03-22 DIAGNOSIS — F419 Anxiety disorder, unspecified: Secondary | ICD-10-CM | POA: Diagnosis present

## 2021-03-22 DIAGNOSIS — R4182 Altered mental status, unspecified: Secondary | ICD-10-CM

## 2021-03-22 DIAGNOSIS — Z79899 Other long term (current) drug therapy: Secondary | ICD-10-CM

## 2021-03-22 DIAGNOSIS — E162 Hypoglycemia, unspecified: Secondary | ICD-10-CM | POA: Diagnosis present

## 2021-03-22 DIAGNOSIS — Z8616 Personal history of COVID-19: Secondary | ICD-10-CM | POA: Diagnosis not present

## 2021-03-22 DIAGNOSIS — S52572A Other intraarticular fracture of lower end of left radius, initial encounter for closed fracture: Secondary | ICD-10-CM | POA: Diagnosis present

## 2021-03-22 DIAGNOSIS — S52502A Unspecified fracture of the lower end of left radius, initial encounter for closed fracture: Secondary | ICD-10-CM | POA: Diagnosis not present

## 2021-03-22 DIAGNOSIS — Y929 Unspecified place or not applicable: Secondary | ICD-10-CM | POA: Diagnosis not present

## 2021-03-22 DIAGNOSIS — S62102S Fracture of unspecified carpal bone, left wrist, sequela: Secondary | ICD-10-CM

## 2021-03-22 DIAGNOSIS — G4700 Insomnia, unspecified: Secondary | ICD-10-CM | POA: Diagnosis present

## 2021-03-22 DIAGNOSIS — I10 Essential (primary) hypertension: Secondary | ICD-10-CM | POA: Diagnosis present

## 2021-03-22 DIAGNOSIS — R079 Chest pain, unspecified: Secondary | ICD-10-CM | POA: Diagnosis not present

## 2021-03-22 DIAGNOSIS — W19XXXA Unspecified fall, initial encounter: Secondary | ICD-10-CM

## 2021-03-22 DIAGNOSIS — S62102A Fracture of unspecified carpal bone, left wrist, initial encounter for closed fracture: Secondary | ICD-10-CM | POA: Diagnosis not present

## 2021-03-22 DIAGNOSIS — Z20822 Contact with and (suspected) exposure to covid-19: Secondary | ICD-10-CM | POA: Diagnosis present

## 2021-03-22 DIAGNOSIS — J439 Emphysema, unspecified: Secondary | ICD-10-CM | POA: Diagnosis present

## 2021-03-22 DIAGNOSIS — I712 Thoracic aortic aneurysm, without rupture, unspecified: Secondary | ICD-10-CM | POA: Diagnosis present

## 2021-03-22 DIAGNOSIS — F03918 Unspecified dementia, unspecified severity, with other behavioral disturbance: Secondary | ICD-10-CM | POA: Diagnosis present

## 2021-03-22 DIAGNOSIS — S52509A Unspecified fracture of the lower end of unspecified radius, initial encounter for closed fracture: Secondary | ICD-10-CM

## 2021-03-22 DIAGNOSIS — R0602 Shortness of breath: Secondary | ICD-10-CM | POA: Diagnosis not present

## 2021-03-22 DIAGNOSIS — G9341 Metabolic encephalopathy: Secondary | ICD-10-CM | POA: Diagnosis present

## 2021-03-22 DIAGNOSIS — Z66 Do not resuscitate: Secondary | ICD-10-CM | POA: Diagnosis present

## 2021-03-22 DIAGNOSIS — S60219A Contusion of unspecified wrist, initial encounter: Secondary | ICD-10-CM | POA: Diagnosis present

## 2021-03-22 DIAGNOSIS — E876 Hypokalemia: Secondary | ICD-10-CM | POA: Diagnosis present

## 2021-03-22 DIAGNOSIS — M25552 Pain in left hip: Secondary | ICD-10-CM | POA: Diagnosis not present

## 2021-03-22 DIAGNOSIS — I7121 Aneurysm of the ascending aorta, without rupture: Secondary | ICD-10-CM | POA: Diagnosis not present

## 2021-03-22 DIAGNOSIS — S52592A Other fractures of lower end of left radius, initial encounter for closed fracture: Secondary | ICD-10-CM | POA: Diagnosis not present

## 2021-03-22 DIAGNOSIS — F17210 Nicotine dependence, cigarettes, uncomplicated: Secondary | ICD-10-CM | POA: Diagnosis present

## 2021-03-22 DIAGNOSIS — R102 Pelvic and perineal pain: Secondary | ICD-10-CM | POA: Diagnosis not present

## 2021-03-22 DIAGNOSIS — I700 Atherosclerosis of aorta: Secondary | ICD-10-CM | POA: Diagnosis not present

## 2021-03-22 DIAGNOSIS — I517 Cardiomegaly: Secondary | ICD-10-CM | POA: Diagnosis not present

## 2021-03-22 DIAGNOSIS — W19XXXD Unspecified fall, subsequent encounter: Secondary | ICD-10-CM | POA: Diagnosis not present

## 2021-03-22 LAB — COMPREHENSIVE METABOLIC PANEL
ALT: 12 U/L (ref 0–44)
AST: 24 U/L (ref 15–41)
Albumin: 3.8 g/dL (ref 3.5–5.0)
Alkaline Phosphatase: 58 U/L (ref 38–126)
Anion gap: 11 (ref 5–15)
BUN: 14 mg/dL (ref 8–23)
CO2: 25 mmol/L (ref 22–32)
Calcium: 8.7 mg/dL — ABNORMAL LOW (ref 8.9–10.3)
Chloride: 102 mmol/L (ref 98–111)
Creatinine, Ser: 0.61 mg/dL (ref 0.44–1.00)
GFR, Estimated: >60 mL/min (ref >60)
Glucose, Bld: 81 mg/dL (ref 70–99)
Potassium: 2.6 mmol/L — CL (ref 3.5–5.1)
Sodium: 138 mmol/L (ref 135–145)
Total Bilirubin: 1.7 mg/dL — ABNORMAL HIGH (ref 0.3–1.2)
Total Protein: 6.7 g/dL (ref 6.5–8.1)

## 2021-03-22 LAB — CBC WITH DIFFERENTIAL/PLATELET
Abs Immature Granulocytes: 0.01 10*3/uL (ref 0.00–0.07)
Basophils Absolute: 0 10*3/uL (ref 0.0–0.1)
Basophils Relative: 1 %
Eosinophils Absolute: 0 10*3/uL (ref 0.0–0.5)
Eosinophils Relative: 0 %
HCT: 36.7 % (ref 36.0–46.0)
Hemoglobin: 11.6 g/dL — ABNORMAL LOW (ref 12.0–15.0)
Immature Granulocytes: 0 %
Lymphocytes Relative: 13 %
Lymphs Abs: 0.8 10*3/uL (ref 0.7–4.0)
MCH: 30.4 pg (ref 26.0–34.0)
MCHC: 31.6 g/dL (ref 30.0–36.0)
MCV: 96.1 fL (ref 80.0–100.0)
Monocytes Absolute: 0.4 10*3/uL (ref 0.1–1.0)
Monocytes Relative: 7 %
Neutro Abs: 4.6 10*3/uL (ref 1.7–7.7)
Neutrophils Relative %: 79 %
Platelets: 117 10*3/uL — ABNORMAL LOW (ref 150–400)
RBC: 3.82 MIL/uL — ABNORMAL LOW (ref 3.87–5.11)
RDW: 15.9 % — ABNORMAL HIGH (ref 11.5–15.5)
WBC: 5.8 10*3/uL (ref 4.0–10.5)
nRBC: 0 % (ref 0.0–0.2)

## 2021-03-22 LAB — URINALYSIS, ROUTINE W REFLEX MICROSCOPIC
Bilirubin Urine: NEGATIVE
Glucose, UA: NEGATIVE mg/dL
Ketones, ur: 80 mg/dL — AB
Leukocytes,Ua: NEGATIVE
Nitrite: NEGATIVE
Protein, ur: 100 mg/dL — AB
Specific Gravity, Urine: 1.018 (ref 1.005–1.030)
pH: 5 (ref 5.0–8.0)

## 2021-03-22 LAB — RAPID URINE DRUG SCREEN, HOSP PERFORMED
Amphetamines: NOT DETECTED
Barbiturates: NOT DETECTED
Benzodiazepines: NOT DETECTED
Cocaine: NOT DETECTED
Opiates: NOT DETECTED
Tetrahydrocannabinol: NOT DETECTED

## 2021-03-22 LAB — RESP PANEL BY RT-PCR (FLU A&B, COVID) ARPGX2
Influenza A by PCR: NEGATIVE
Influenza B by PCR: NEGATIVE
SARS Coronavirus 2 by RT PCR: NEGATIVE

## 2021-03-22 LAB — CBG MONITORING, ED: Glucose-Capillary: 69 mg/dL — ABNORMAL LOW (ref 70–99)

## 2021-03-22 LAB — MAGNESIUM: Magnesium: 1.4 mg/dL — ABNORMAL LOW (ref 1.7–2.4)

## 2021-03-22 MED ORDER — METOPROLOL TARTRATE 25 MG PO TABS
25.0000 mg | ORAL_TABLET | Freq: Two times a day (BID) | ORAL | Status: DC
  Administered 2021-03-22 – 2021-03-24 (×3): 25 mg via ORAL
  Filled 2021-03-22 (×3): qty 1

## 2021-03-22 MED ORDER — ONDANSETRON HCL 4 MG/2ML IJ SOLN: 4.0000 mg | Freq: Four times a day (QID) | INTRAMUSCULAR | Status: DC | PRN

## 2021-03-22 MED ORDER — MORPHINE SULFATE (PF) 2 MG/ML IV SOLN
2.0000 mg | INTRAVENOUS | Status: DC | PRN
Start: 2021-03-22 — End: 2021-03-25
  Administered 2021-03-22 – 2021-03-25 (×10): 2 mg via INTRAVENOUS
  Filled 2021-03-22 (×10): qty 1

## 2021-03-22 MED ORDER — POLYETHYLENE GLYCOL 3350 17 G PO PACK: 17.0000 g | PACK | Freq: Every day | ORAL | Status: DC | PRN

## 2021-03-22 MED ORDER — IOHEXOL 350 MG/ML SOLN
60.0000 mL | Freq: Once | INTRAVENOUS | Status: AC | PRN
  Administered 2021-03-22: 60 mL via INTRAVENOUS

## 2021-03-22 MED ORDER — POTASSIUM CHLORIDE 10 MEQ/100ML IV SOLN
10.0000 meq | Freq: Once | INTRAVENOUS | Status: AC
  Administered 2021-03-22: 10 meq via INTRAVENOUS
  Filled 2021-03-22: qty 100

## 2021-03-22 MED ORDER — MORPHINE SULFATE (PF) 2 MG/ML IV SOLN
2.0000 mg | Freq: Once | INTRAVENOUS | Status: AC
  Administered 2021-03-22: 2 mg via INTRAVENOUS
  Filled 2021-03-22: qty 1

## 2021-03-22 MED ORDER — POTASSIUM CHLORIDE IN NACL 40-0.9 MEQ/L-% IV SOLN
INTRAVENOUS | Status: DC
  Filled 2021-03-22 (×2): qty 1000

## 2021-03-22 MED ORDER — ACETAMINOPHEN 325 MG PO TABS
650.0000 mg | ORAL_TABLET | Freq: Four times a day (QID) | ORAL | Status: DC | PRN
  Administered 2021-03-23 – 2021-03-24 (×3): 650 mg via ORAL
  Filled 2021-03-22 (×3): qty 2

## 2021-03-22 MED ORDER — ONDANSETRON HCL 4 MG PO TABS: 4.0000 mg | ORAL_TABLET | Freq: Four times a day (QID) | ORAL | Status: DC | PRN

## 2021-03-22 MED ORDER — KCL IN DEXTROSE-NACL 40-5-0.9 MEQ/L-%-% IV SOLN
INTRAVENOUS | Status: AC
  Filled 2021-03-22 (×2): qty 1000

## 2021-03-22 MED ORDER — DEXTROSE 50 % IV SOLN
50.0000 mL | Freq: Once | INTRAVENOUS | Status: AC
  Administered 2021-03-22: 50 mL via INTRAVENOUS
  Filled 2021-03-22: qty 50

## 2021-03-22 MED ORDER — MAGNESIUM SULFATE 2 GM/50ML IV SOLN
2.0000 g | INTRAVENOUS | Status: AC
  Administered 2021-03-22: 2 g via INTRAVENOUS
  Filled 2021-03-22: qty 50

## 2021-03-22 MED ORDER — IPRATROPIUM-ALBUTEROL 20-100 MCG/ACT IN AERS: 1.0000 | INHALATION_SPRAY | Freq: Four times a day (QID) | RESPIRATORY_TRACT | Status: DC | PRN

## 2021-03-22 MED ORDER — POTASSIUM CHLORIDE 10 MEQ/100ML IV SOLN
10.0000 meq | INTRAVENOUS | Status: AC
  Administered 2021-03-22 (×2): 10 meq via INTRAVENOUS
  Filled 2021-03-22 (×2): qty 100

## 2021-03-22 MED ORDER — LORAZEPAM 2 MG/ML IJ SOLN: 0.5000 mg | INTRAMUSCULAR | Status: DC | PRN

## 2021-03-22 MED ORDER — ACETAMINOPHEN 650 MG RE SUPP: 650.0000 mg | Freq: Four times a day (QID) | RECTAL | Status: DC | PRN

## 2021-03-22 MED ORDER — LORAZEPAM 2 MG/ML IJ SOLN
0.5000 mg | INTRAMUSCULAR | Status: AC | PRN
  Administered 2021-03-22 – 2021-03-23 (×2): 0.5 mg via INTRAVENOUS
  Filled 2021-03-22 (×2): qty 1

## 2021-03-22 MED ORDER — SODIUM CHLORIDE 0.9 % IV BOLUS
500.0000 mL | Freq: Once | INTRAVENOUS | Status: AC
  Administered 2021-03-22: 500 mL via INTRAVENOUS

## 2021-03-22 NOTE — ED Notes (Signed)
Date and time results received: 03/22/21 1400   Test: Potassium  Critical Value: 2.6  Name of Provider Notified: Dr. Estell Harpin  Orders Received? Or Actions Taken?: Notified

## 2021-03-22 NOTE — ED Provider Notes (Signed)
Change of shift, care excepted from Dr. Estell Harpin, added CT scan and will consult with orthopedics for comminuted wrist fracture, discussed with Dr. Mariea Clonts to admit for altered mental status, no obvious source found, hypokalemia repleted his magnesium was given as well as potassium.   Eber Hong, MD 03/22/21 [phone]
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
## 2021-03-22 NOTE — ED Provider Notes (Signed)
[facility] EMERGENCY DEPARTMENT Provider Note   CSN: [id number] Arrival date & time: 03/22/21  1209     History Chief Complaint  Patient presents with   Altered Mental Status    Denise Mendoza is a 87 y.o. female.  Patient was brought in by her son for confusion.  He said that the last time she was seen normal was Monday.  Patient also has pain in her left wrist  The history is provided by the patient and medical records.  Altered Mental Status Presenting symptoms: disorientation   Severity:  Moderate Most recent episode:  Today Timing:  Constant Progression:  Waxing and waning Chronicity:  New Context: not alcohol use   Associated symptoms: abdominal pain       Past Medical History:  Diagnosis Date   Hyperlipidemia    Hypertension    Lower back pain Oct 2013   Occlusive disease, arterial    Peripheral vascular disease (HCC)    Stress Oct. 2013   Dr. Ignatius Specking    Patient Active Problem List   Diagnosis Date Noted   Pneumonia due to COVID-19 virus 01/17/2021   Acute cystitis without hematuria    Pulmonary emphysema (HCC)    Closed displaced fracture of right femoral neck with routine healing 01/01/2020   Hypokalemia 01/01/2020   Dehydration 01/01/2020   Respiratory failure with hypoxia (HCC) 01/01/2020   Vitamin B12 deficiency 01/01/2020   Thrombocytopenia (HCC) 01/01/2020   Leukocytosis 01/01/2020   Acute lower UTI 01/01/2020   Essential hypertension 01/01/2020   Hyperlipidemia 01/01/2020   Altered mental status 12/31/2019   Bilateral leg pain 05/12/2012   Hip pain 05/12/2012    Past Surgical History:  Procedure Laterality Date   ABDOMINAL HYSTERECTOMY     BACK SURGERY     HIP PINNING,CANNULATED Right 01/03/2020   Procedure: OPEN TREATMENT INTERNAL FIXATION RIGHT HIP;  Surgeon: Vickki Hearing, MD;  Location: [facility];  Service: Orthopedics;  Laterality: Right;   NOSE SURGERY     TUMOR REMOVED FROM FINGER       OB History   No  obstetric history on file.     Family History  Problem Relation Age of Onset   Heart disease Mother    Other Father        MACULARDEGENERATION    Social History   Tobacco Use   Smoking status: Every Day    Packs/day: 1.00    Years: 30.00    Pack years: 30.00    Types: Cigarettes   Smokeless tobacco: Never  Substance Use Topics   Alcohol use: No   Drug use: No    Home Medications Prior to Admission medications   Medication Sig Start Date End Date Taking? Authorizing Provider  ALPRAZolam Prudy Feeler) 0.5 MG tablet Take 1 mg by mouth 2 (two) times daily.   Yes [provider]  amitriptyline (ELAVIL) 10 MG tablet TAKE ONE TABLET BY MOUTH AT BEDTIME 01/04/21  Yes Vickki Hearing, MD  aspirin EC 325 MG EC tablet Take 1 tablet (325 mg total) by mouth daily with breakfast. 01/06/20  Yes Vassie Loll, MD  Ipratropium-Albuterol (COMBIVENT) 20-100 MCG/ACT AERS respimat Inhale 1 puff into the lungs every 6 (six) hours as needed for wheezing or shortness of breath. 01/20/21 03/22/21 Yes Shahmehdi, Gemma Payor, MD  metoprolol tartrate (LOPRESSOR) 25 MG tablet Take 1 tablet (25 mg total) by mouth 2 (two) times daily. 01/20/21 03/22/21 Yes Shahmehdi, Gemma Payor, MD  simvastatin (ZOCOR) 40 MG tablet  Take 1 tablet (40 mg total) by mouth every evening. 01/20/21 03/22/21 Yes Shahmehdi, Seyed A, MD  traMADol (ULTRAM) 50 MG tablet Take 50 mg by mouth 4 (four) times daily. 02/11/20  Yes [provider]  guaiFENesin-dextromethorphan (ROBITUSSIN DM) 100-10 MG/5ML syrup Take 10 mLs by mouth every 8 (eight) hours. Patient not taking: No sig reported 01/20/21   Kendell Bane, MD  methylPREDNISolone (MEDROL DOSEPAK) 4 MG TBPK tablet Medrol Dosepak take as instructed Patient not taking: Reported on 03/22/2021 01/20/21   Kendell Bane, MD    Allergies    Patient has no known allergies.  Review of Systems   Review of Systems  Unable to perform ROS: Mental status change  Gastrointestinal:   Positive for abdominal pain.   Physical Exam Updated Vital Signs BP (!) 151/84   Pulse 94   Temp 98.6 F (37 C) (Oral)   Resp 19   SpO2 95%   Physical Exam Vitals and nursing note reviewed.  Constitutional:      Appearance: She is well-developed.  HENT:     Head: Normocephalic.     Nose: Nose normal.  Eyes:     General: No scleral icterus.    Conjunctiva/sclera: Conjunctivae normal.  Neck:     Thyroid: No thyromegaly.  Cardiovascular:     Rate and Rhythm: Normal rate and regular rhythm.     Heart sounds: No murmur heard.   No friction rub. No gallop.  Pulmonary:     Breath sounds: No stridor. No wheezing or rales.  Chest:     Chest wall: No tenderness.  Abdominal:     General: There is no distension.     Tenderness: There is no abdominal tenderness. There is no rebound.  Musculoskeletal:        General: Normal range of motion.     Cervical back: Neck supple.     Comments: Tender swollen left wrist with good pulses  Lymphadenopathy:     Cervical: No cervical adenopathy.  Skin:    Findings: No erythema or rash.  Neurological:     Mental Status: She is alert.     Motor: No abnormal muscle tone.     Coordination: Coordination normal.     Comments: Patient wanted to person only  Psychiatric:        Behavior: Behavior normal.    ED Results / Procedures / Treatments   Labs (all labs ordered are listed, but only abnormal results are displayed) Labs Reviewed  CBC WITH DIFFERENTIAL/PLATELET - Abnormal; Notable for the following components:      Result Value   RBC 3.82 (*)    Hemoglobin 11.6 (*)    RDW 15.9 (*)    Platelets 117 (*)    All other components within normal limits  COMPREHENSIVE METABOLIC PANEL - Abnormal; Notable for the following components:   Potassium 2.6 (*)    Calcium 8.7 (*)    Total Bilirubin 1.7 (*)    All other components within normal limits  URINALYSIS, ROUTINE W REFLEX MICROSCOPIC  RAPID URINE DRUG SCREEN, HOSP PERFORMED     EKG None  Radiology DG Chest 2 View  Result Date: 03/22/2021 CLINICAL DATA:  Shortness of breath.  Fall. EXAM: CHEST - 2 VIEW COMPARISON:  January 17, 2021. FINDINGS: Stable cardiomegaly with aneurysmal dilatation of the thoracic aorta. Both lungs are clear. The visualized skeletal structures are unremarkable. IMPRESSION: Stable cardiomegaly with aneurysmal dilatation of thoracic aorta. CT scan is recommended for further evaluation. Electronically Signed  By: Lupita Raider M.D.   On: 03/22/2021 14:45   DG Pelvis 1-2 Views  Result Date: 03/22/2021 CLINICAL DATA:  Left hip pain after fall. EXAM: PELVIS - 1-2 VIEW COMPARISON:  January 03, 2020. FINDINGS: There is no evidence of acute pelvic fracture or diastasis. Status post surgical internal fixation of old proximal right femoral fracture. IMPRESSION: No acute abnormality seen. Electronically Signed   By: Lupita Raider M.D.   On: 03/22/2021 14:49   DG Forearm Left  Result Date: 03/22/2021 CLINICAL DATA:  Left forearm pain after fall. EXAM: LEFT FOREARM - 2 VIEW COMPARISON:  None. FINDINGS: Severely displaced and probably comminuted distal left radial fracture is noted. The ulna appears grossly unremarkable. IMPRESSION: Severely displaced and probably comminuted distal left radial fracture. Electronically Signed   By: Lupita Raider M.D.   On: 03/22/2021 14:47   CT Head Wo Contrast  Result Date: 03/22/2021 CLINICAL DATA:  An 87 year old female presents with altered mental status and potential fall. EXAM: CT HEAD WITHOUT CONTRAST TECHNIQUE: Contiguous axial images were obtained from the base of the skull through the vertex without intravenous contrast. COMPARISON:  December 31, 2019. FINDINGS: Brain: No evidence of acute infarction, hemorrhage, hydrocephalus, extra-axial collection or mass lesion/mass effect. Signs of chronic microvascular ischemic change with mild asymmetry not changed since July of 2021. Age related atrophy as before.  Vascular: No hyperdense vessel or unexpected calcification. Skull: Normal. Negative for fracture or focal lesion. Sinuses/Orbits: Visualized paranasal sinuses and orbits are unremarkable. Other: None. IMPRESSION: No acute intracranial abnormality. Signs of chronic microvascular ischemic change with mild asymmetry not changed since July of 2021. Electronically Signed   By: Donzetta Kohut M.D.   On: 03/22/2021 14:25    Procedures Procedures   Medications Ordered in ED Medications - No data to display  ED Course  I have reviewed the triage vital signs and the nursing notes.  Pertinent labs & imaging results that were available during my care of the patient were reviewed by me and considered in my medical decision making (see chart for details).  Patient with a distal radial fracture.  Orthopedics will be called.  Patient with hypokalemia she will be admitted to medicine.  Urinalysis pending MDM Rules/Calculators/A&P                           Hypokalemia distal radial fracture.  Patient will be admitted to medicine with orthopedic consult Final Clinical Impression(s) / ED Diagnoses Final diagnoses:  Fall    Rx / DC Orders ED Discharge Orders     None        Bethann Berkshire, MD 03/24/21 [id number]

## 2021-03-22 NOTE — H&P (Addendum)
History and Physical    ADASHA BOEHME RUE:[id number] DOB: [birth year] DOA: 03/22/2021  PCP: Ignatius Specking, MD   Patient coming from: Home  I have personally briefly reviewed patient's old medical records in [facility]  Chief Complaint: Confusion, left hand pain.  HPI: Denise Mendoza is a 87 y.o. female with medical history significant for hypertension, peripheral vascular disease. Patient was brought to the ED by family.  Member.  Patient lives alone, patient's son reported that patient has been confused and talking out of her head for 3 days.  Per triage notes, initially patient was able to tell place and date and year. At the time of my evaluation, patient is intermittently confused, sometimes able to answer questions appropriately.  Recent hospitalization 01/2021 for acute respiratory failure with hypoxia secondary to COVID-pneumonia, treated with remdesivir and steroids.  I called patient's son, Fredrik Cove who is HCPOA.  At baseline, patient is independent of all ADLs.  Patient ambulates with a cane. He is not aware of any memory problems.  Patient lives alone.  Last checked on patient is able on the 17th.  He does not know when patient fell or how she sustained a fracture to her wrist.  ED Course: Temperature 98.6.  Heart rate 83-108.  Respiratory rate 14-26, blood pressure systolic 1 34-1 58.  O2 sats greater than 92% on room air.  WBC 5.8.  Potassium 2.6.  UA shows rare bacteria only.  UDS unremarkable.   Chest x-ray showed stable cardiomegaly with aneurysmal dilation of the thoracic aorta, CT scan was recommended and was subsequently done with contrast5.5 cm ascending thoracic aortic aneurysm. Left forearm x-ray shows severely displaced and probably comminuted distal left radial fracture.  Pelvic x-ray without acute abnormality. -Potassium supplementation started, hospitalist called to admit , with plans to consult Ortho for comminuted wrist fracture.  Review of Systems: As per  HPI all other systems reviewed and negative.  Past Medical History:  Diagnosis Date   Hyperlipidemia    Hypertension    Lower back pain Oct 2013   Occlusive disease, arterial    Peripheral vascular disease [facility])    Stress Oct. 2013   Dr. Ignatius Specking    Past Surgical History:  Procedure Laterality Date   ABDOMINAL HYSTERECTOMY     BACK SURGERY     HIP PINNING,CANNULATED Right 01/03/2020   Procedure: OPEN TREATMENT INTERNAL FIXATION RIGHT HIP;  Surgeon: Vickki Hearing, MD;  Location: [facility];  Service: Orthopedics;  Laterality: Right;   NOSE SURGERY     TUMOR REMOVED FROM FINGER       reports that she has been smoking cigarettes. She has a 30.00 pack-year smoking history. She has never used smokeless tobacco. She reports that she does not drink alcohol and does not use drugs.  No Known Allergies  Family History  Problem Relation Age of Onset   Heart disease Mother    Other Father        MACULARDEGENERATION    Prior to Admission medications   Medication Sig Start Date End Date Taking? Authorizing Provider  ALPRAZolam Prudy Feeler) 0.5 MG tablet Take 1 mg by mouth 2 (two) times daily.   Yes [provider]  amitriptyline (ELAVIL) 10 MG tablet TAKE ONE TABLET BY MOUTH AT BEDTIME 01/04/21  Yes Vickki Hearing, MD  aspirin EC 325 MG EC tablet Take 1 tablet (325 mg total) by mouth daily with breakfast. 01/06/20  Yes Vassie Loll, MD  Ipratropium-Albuterol (COMBIVENT) 20-100 MCG/ACT  AERS respimat Inhale 1 puff into the lungs every 6 (six) hours as needed for wheezing or shortness of breath. 01/20/21 03/22/21 Yes Shahmehdi, Seyed A, MD  metoprolol tartrate (LOPRESSOR) 25 MG tablet Take 1 tablet (25 mg total) by mouth 2 (two) times daily. 01/20/21 03/22/21 Yes Shahmehdi, Gemma Payor, MD  simvastatin (ZOCOR) 40 MG tablet Take 1 tablet (40 mg total) by mouth every evening. 01/20/21 03/22/21 Yes Shahmehdi, Seyed A, MD  traMADol (ULTRAM) 50 MG tablet Take 50 mg by mouth 4 (four) times  daily. 02/11/20  Yes [provider]  guaiFENesin-dextromethorphan (ROBITUSSIN DM) 100-10 MG/5ML syrup Take 10 mLs by mouth every 8 (eight) hours. Patient not taking: No sig reported 01/20/21   Kendell Bane, MD  methylPREDNISolone (MEDROL DOSEPAK) 4 MG TBPK tablet Medrol Dosepak take as instructed Patient not taking: Reported on 03/22/2021 01/20/21   Kendell Bane, MD    Physical Exam: Vitals:   03/22/21 1700 03/22/21 1715 03/22/21 1800 03/22/21 1830  BP: (!) 152/77  (!) 151/71 (!) 158/76  Pulse: (!) 103 100 99 (!) 108  Resp: (!) 22 20 (!) 21 (!) 26  Temp:      TempSrc:      SpO2: 92% (!) 89% 93% 94%    Constitutional: Slightly agitated, but appears comfortable, follows some directions. Vitals:   03/22/21 1700 03/22/21 1715 03/22/21 1800 03/22/21 1830  BP: (!) 152/77  (!) 151/71 (!) 158/76  Pulse: (!) 103 100 99 (!) 108  Resp: (!) 22 20 (!) 21 (!) 26  Temp:      TempSrc:      SpO2: 92% (!) 89% 93% 94%   Eyes: PERRL, lids and conjunctivae normal ENMT: Mucous membranes are very dry Neck: normal, supple, no masses, no thyromegaly Respiratory: clear to auscultation bilaterally, no wheezing, no crackles. Normal respiratory effort. No accessory muscle use.  Cardiovascular: Regular rate and rhythm, no murmurs / rubs / gallops. No extremity edema. 2+ pedal pulses.   Abdomen: no tenderness, no masses palpated. No hepatosplenomegaly. Bowel sounds positive.  Musculoskeletal: Fracture to left wrist, with clean dressing,  Neurologic: No apparent cranial nerve abnormality, 4+5 strength in all extremities. Psychiatric: Awake, alert, speech is confused, she is not able to tell me where she is, or her name.  Labs on Admission: I have personally reviewed following labs and imaging studies  CBC: Recent Labs  Lab 03/22/21 1310  WBC 5.8  NEUTROABS 4.6  HGB 11.6*  HCT 36.7  MCV 96.1  PLT 117*   Basic Metabolic Panel: Recent Labs  Lab 03/22/21 1310  NA 138  K 2.6*   CL 102  CO2 25  GLUCOSE 81  BUN 14  CREATININE 0.61  CALCIUM 8.7*  MG 1.4*   Liver Function Tests: Recent Labs  Lab 03/22/21 1310  AST 24  ALT 12  ALKPHOS 58  BILITOT 1.7*  PROT 6.7  ALBUMIN 3.8   Urine analysis:    Component Value Date/Time   COLORURINE AMBER (A) 03/22/2021 1542   APPEARANCEUR HAZY (A) 03/22/2021 1542   LABSPEC 1.018 03/22/2021 1542   PHURINE 5.0 03/22/2021 1542   GLUCOSEU NEGATIVE 03/22/2021 1542   HGBUR MODERATE (A) 03/22/2021 1542   BILIRUBINUR NEGATIVE 03/22/2021 1542   KETONESUR 80 (A) 03/22/2021 1542   PROTEINUR 100 (A) 03/22/2021 1542   NITRITE NEGATIVE 03/22/2021 1542   LEUKOCYTESUR NEGATIVE 03/22/2021 1542    Radiological Exams on Admission: DG Chest 2 View  Result Date: 03/22/2021 CLINICAL DATA:  Shortness of breath.  Fall. EXAM: CHEST - 2 VIEW COMPARISON:  January 17, 2021. FINDINGS: Stable cardiomegaly with aneurysmal dilatation of the thoracic aorta. Both lungs are clear. The visualized skeletal structures are unremarkable. IMPRESSION: Stable cardiomegaly with aneurysmal dilatation of thoracic aorta. CT scan is recommended for further evaluation. Electronically Signed   By: Lupita Raider M.D.   On: 03/22/2021 14:45   DG Pelvis 1-2 Views  Result Date: 03/22/2021 CLINICAL DATA:  Left hip pain after fall. EXAM: PELVIS - 1-2 VIEW COMPARISON:  January 03, 2020. FINDINGS: There is no evidence of acute pelvic fracture or diastasis. Status post surgical internal fixation of old proximal right femoral fracture. IMPRESSION: No acute abnormality seen. Electronically Signed   By: Lupita Raider M.D.   On: 03/22/2021 14:49   DG Forearm Left  Result Date: 03/22/2021 CLINICAL DATA:  Left forearm pain after fall. EXAM: LEFT FOREARM - 2 VIEW COMPARISON:  None. FINDINGS: Severely displaced and probably comminuted distal left radial fracture is noted. The ulna appears grossly unremarkable. IMPRESSION: Severely displaced and probably comminuted distal  left radial fracture. Electronically Signed   By: Lupita Raider M.D.   On: 03/22/2021 14:47   CT Head Wo Contrast  Result Date: 03/22/2021 CLINICAL DATA:  An 87 year old female presents with altered mental status and potential fall. EXAM: CT HEAD WITHOUT CONTRAST TECHNIQUE: Contiguous axial images were obtained from the base of the skull through the vertex without intravenous contrast. COMPARISON:  December 31, 2019. FINDINGS: Brain: No evidence of acute infarction, hemorrhage, hydrocephalus, extra-axial collection or mass lesion/mass effect. Signs of chronic microvascular ischemic change with mild asymmetry not changed since July of 2021. Age related atrophy as before. Vascular: No hyperdense vessel or unexpected calcification. Skull: Normal. Negative for fracture or focal lesion. Sinuses/Orbits: Visualized paranasal sinuses and orbits are unremarkable. Other: None. IMPRESSION: No acute intracranial abnormality. Signs of chronic microvascular ischemic change with mild asymmetry not changed since July of 2021. Electronically Signed   By: Donzetta Kohut M.D.   On: 03/22/2021 14:25   CT Chest W Contrast  Result Date: 03/22/2021 CLINICAL DATA:  Chest pain. EXAM: CT CHEST WITH CONTRAST TECHNIQUE: Multidetector CT imaging of the chest was performed during intravenous contrast administration. CONTRAST:  55mL OMNIPAQUE IOHEXOL 350 MG/ML SOLN COMPARISON:  Radiograph of same day. FINDINGS: Cardiovascular: 5.5 cm ascending thoracic aortic aneurysm is noted. No dissection is noted. Atherosclerosis of thoracic aorta is noted. 4.7 cm aneurysm is seen involving the distal portion of the transverse aortic arch. Mild cardiomegaly is noted. No pericardial effusion is noted. Mediastinum/Nodes: No enlarged mediastinal, hilar, or axillary lymph nodes. Thyroid gland, trachea, and esophagus demonstrate no significant findings. Lungs/Pleura: No pneumothorax or pleural effusion is noted. Mild right apical scarring is noted. Left  lung is unremarkable. Upper Abdomen: No acute abnormality. Musculoskeletal: Multiple old upper thoracic vertebral body compression fractures are noted. No acute abnormality is noted. IMPRESSION: 5.5 cm ascending thoracic aortic aneurysm. Cardiothoracic surgery consultation recommended due to increased risk of rupture for arch aneurysm ? 5.5 cm. This recommendation follows 2010 ACCF/AHA/AATS/ACR/ASA/SCA/SCAI/SIR/STS/SVM Guidelines for the Diagnosis and Management of Patients With Thoracic Aortic Disease. Circulation. 2010; 121: O175-Z025. Aortic aneurysm NOS (ICD10-I71.9). 4.7 cm aneurysm is seen involving distal portion of transverse aortic arch. Aortic Atherosclerosis (ICD10-I70.0). Electronically Signed   By: Lupita Raider M.D.   On: 03/22/2021 19:39    EKG: None  Assessment/Plan Principal Problem:   Acute metabolic encephalopathy Active Problems:   Essential hypertension   Hyperlipidemia   Pulmonary emphysema (  HCC)   Left wrist fracture, closed, initial encounter   Acute metabolic encephalopathy-confusion, becoming more agitated in the ED.She appears very dehydrated, this together with uncontrolled pain may be causing her encephalopathy.  Blood sugar also low 81 > 69. Head CT without acute abnormality.  No focal neurologic deficits.  Vital stable.  Afebrile no leukocytosis.  UA with rare bacteria only, chest x-ray unremarkable. - D50 x 1 - bolus, continue D5 N/s + 40 KCL 100cc/hr x 15hrs -Ativan 0.5 mg as needed x 2 doses  Left wrist fracture-likely from fall.  Pressure severely displaced and probably comminuted distal left radial fracture. -IV morphine 2 mg as needed - Talked to Ortho, patient to be seen in the morning, he had requested that the wrist be reduced and  splinted. -Will obtain x-ray. -NPO midnight, pending Ortho evaluation  Hypokalemia, hypomagnesemia- potassium 2.6, magnesium 1.4.  -Replete  Hypertension-systolic 130s to 132G. -Resume home metoprolol  Pulmonary  emphysema-stable.  Chest x-ray clear. -Resume home bronchodilator regimen  Thoracic aortic aneurysm - 5.5 cm ascending thoracic aortic aneurysm. Cardiothoracic surgery consultation recommended due to increased risk of rupture for arch aneurysm ? 5.5 cm. -I talked with CTS surgeon- Dr. Dorris Fetch, patient should follow-up with cardiothoracic surgery at some point, but this does not need to be done urgently and should not affect her left wrist surgery.   DVT prophylaxis: SCDS Code Status: DNR.  Confirmed with patient's son Fredrik Cove on the phone. Family Communication:  Called patients Son Fredrik Cove  ( Who is HCPOA) and his wife on the phone. Disposition Plan: > 2 days Consults called:  Ortho Admission status: Inpt, tele I certify that at the point of admission it is my clinical judgment that the patient will require inpatient hospital care spanning beyond 2 midnights from the point of admission due to high intensity of service, high risk for further deterioration and high frequency of surveillance required.   Onnie Boer MD [facility]  03/22/2021, 10:40 PM

## 2021-03-22 NOTE — ED Notes (Signed)
Pt confusion has increased, fidgety in bed, trying to get up, attempting to take off splint, and vital sign adapters. Tech attempting to calm pt down.

## 2021-03-22 NOTE — ED Triage Notes (Signed)
Family member states she is not talking right. Left hand pain

## 2021-03-23 ENCOUNTER — Other Ambulatory Visit: Payer: Self-pay

## 2021-03-23 ENCOUNTER — Inpatient Hospital Stay (HOSPITAL_COMMUNITY): Payer: Medicare Other

## 2021-03-23 DIAGNOSIS — G9341 Metabolic encephalopathy: Secondary | ICD-10-CM | POA: Diagnosis not present

## 2021-03-23 LAB — CBC
HCT: 33.8 % — ABNORMAL LOW (ref 36.0–46.0)
Hemoglobin: 10.3 g/dL — ABNORMAL LOW (ref 12.0–15.0)
MCH: 30.4 pg (ref 26.0–34.0)
MCHC: 30.5 g/dL (ref 30.0–36.0)
MCV: 99.7 fL (ref 80.0–100.0)
Platelets: 100 10*3/uL — ABNORMAL LOW (ref 150–400)
RBC: 3.39 MIL/uL — ABNORMAL LOW (ref 3.87–5.11)
RDW: 15.9 % — ABNORMAL HIGH (ref 11.5–15.5)
WBC: 6.2 10*3/uL (ref 4.0–10.5)
nRBC: 0 % (ref 0.0–0.2)

## 2021-03-23 LAB — BASIC METABOLIC PANEL
Anion gap: 9 (ref 5–15)
BUN: 8 mg/dL (ref 8–23)
CO2: 22 mmol/L (ref 22–32)
Calcium: 7.6 mg/dL — ABNORMAL LOW (ref 8.9–10.3)
Chloride: 109 mmol/L (ref 98–111)
Creatinine, Ser: 0.38 mg/dL — ABNORMAL LOW (ref 0.44–1.00)
GFR, Estimated: >60 mL/min (ref >60)
Glucose, Bld: 123 mg/dL — ABNORMAL HIGH (ref 70–99)
Potassium: 3.8 mmol/L (ref 3.5–5.1)
Sodium: 140 mmol/L (ref 135–145)

## 2021-03-23 MED ORDER — LIDOCAINE-EPINEPHRINE (PF) 2 %-1:200000 IJ SOLN
10.0000 mL | Freq: Once | INTRAMUSCULAR | Status: AC
  Administered 2021-03-23: 10 mL via INTRADERMAL
  Filled 2021-03-23: qty 20

## 2021-03-23 MED ORDER — DEXTROSE-NACL 5-0.45 % IV SOLN
INTRAVENOUS | Status: AC
Start: 2021-03-23 — End: 2021-03-24

## 2021-03-23 MED ORDER — SODIUM CHLORIDE 0.9 % IV SOLN
1.0000 g | INTRAVENOUS | Status: DC
  Administered 2021-03-23 – 2021-03-25 (×3): 1 g via INTRAVENOUS
  Filled 2021-03-23 (×3): qty 10

## 2021-03-23 MED ORDER — POVIDONE-IODINE 10 % EX SOLN
CUTANEOUS | Status: AC
  Filled 2021-03-23: qty 15

## 2021-03-23 NOTE — Progress Notes (Signed)
Knows name,birthdate, son's name, that she is at [facility] and what year it is.

## 2021-03-23 NOTE — ED Notes (Signed)
Dr. Genevieve Norlander, orthopedist at bedside at this time

## 2021-03-23 NOTE — Consult Note (Signed)
ORTHOPAEDIC CONSULTATION  REQUESTING PHYSICIAN: Roxan Hockey, MD  ASSESSMENT AND PLAN: 87 y.o. female with the following: Left distal radius fracture   Fracture was reduced yesterday in the ED and a splint was placed.   She remained in the ED over night, waiting for bed placement.  Early this morning, nursing noted that her fingers were bruised and swollen, and that there was a rapid increase in the swelling to her fingers.  I was asked to come and see the patient urgently due to concern for a developing compartment syndrome.  Patient was evaluated in the ED with the assistance of the nurse starting her shift.  Upon removal of the splint, the patient had swelling and bruising to the wrist area and into her hand.  All compartments were soft, and although the patient is altered and does not respond to simple question, she was comfortable with passive stretch of her fingers.    Dr. Doren Custard in the ED planned to proceed with repeat reduction and a new splint.  Post reduction XR demonstrates improved alignment.  Will continue to follow patient.  Will see the patient in follow up in clinic.    - Weight Bearing Status/Activity: NWB LUE, keep splint clean, dry and intact.  Elevate that hand as much as possible to aid in swelling of the hand.   - Additional recommended labs/tests: None  -VTE Prophylaxis: discretion of medicine team  - Pain control: tylenol, elevation of hand.  Avoid sedating medications, including narcotics, if possible.   - Follow-up plan: 2 weeks in clinic.  If remains in hospital, recommend repeat XR in 1 week  -Procedures: closed reduction in ED  Chief Complaint: Left wrist injury.   HPI: Denise Mendoza is a 87 y.o. female who presented to the emergency department yesterday, with her son, due to some confusion.  The patient son also noted that she was complaining of left wrist pain.  Source of the confusion has not been determined.  X-rays demonstrated a displaced distal  radius fracture.  Her wrist was reduced in the emergency department yesterday, and she was placed in a splint.  Her care was discussed with the admitting provider yesterday, and post reduction x-rays demonstrated improved alignment.  Initially, my plan was to evaluate the patient this morning, and make appropriate recommendations as needed.  On my way to the hospital this morning to evaluate the patient, I was asked to urgently evaluate her, due to concerns for compartment syndrome.  She remained in the emergency department awaiting bed placement.  She remains altered and confused.  No family was available at the bedside.  She appears comfortable except for when we move her arm.  Past Medical History:  Diagnosis Date   Hyperlipidemia    Hypertension    Lower back pain Oct 2013   Occlusive disease, arterial    Peripheral vascular disease [facility])    Stress Oct. 2013   Dr. Glenda Chroman   Past Surgical History:  Procedure Laterality Date   ABDOMINAL HYSTERECTOMY     BACK SURGERY     HIP PINNING,CANNULATED Right 01/03/2020   Procedure: OPEN TREATMENT INTERNAL FIXATION RIGHT HIP;  Surgeon: Carole Civil, MD;  Location: [facility];  Service: Orthopedics;  Laterality: Right;   NOSE SURGERY     TUMOR REMOVED FROM FINGER     Social History   Socioeconomic History   Marital status: Widowed    Spouse name: Not on file   Number of children: Not on  file   Years of education: Not on file   Highest education level: Not on file  Occupational History   Not on file  Tobacco Use   Smoking status: Every Day    Packs/day: 1.00    Years: 30.00    Pack years: 30.00    Types: Cigarettes   Smokeless tobacco: Never  Substance and Sexual Activity   Alcohol use: No   Drug use: No   Sexual activity: Not on file  Other Topics Concern   Not on file  Social History Narrative   Not on file   Social Determinants of Health   Financial Resource Strain: Not on file  Food Insecurity: Not on file   Transportation Needs: Not on file  Physical Activity: Not on file  Stress: Not on file  Social Connections: Not on file   Family History  Problem Relation Age of Onset   Heart disease Mother    Other Father        MACULARDEGENERATION   No Known Allergies Prior to Admission medications   Medication Sig Start Date End Date Taking? Authorizing Provider  ALPRAZolam Duanne Moron) 0.5 MG tablet Take 1 mg by mouth 2 (two) times daily.   Yes [provider]  amitriptyline (ELAVIL) 10 MG tablet TAKE ONE TABLET BY MOUTH AT BEDTIME 01/04/21  Yes Carole Civil, MD  aspirin EC 325 MG EC tablet Take 1 tablet (325 mg total) by mouth daily with breakfast. 01/06/20  Yes Barton Dubois, MD  Ipratropium-Albuterol (COMBIVENT) 20-100 MCG/ACT AERS respimat Inhale 1 puff into the lungs every 6 (six) hours as needed for wheezing or shortness of breath. 01/20/21 03/22/21 Yes Shahmehdi, Valeria Batman, MD  metoprolol tartrate (LOPRESSOR) 25 MG tablet Take 1 tablet (25 mg total) by mouth 2 (two) times daily. 01/20/21 03/22/21 Yes Shahmehdi, Valeria Batman, MD  simvastatin (ZOCOR) 40 MG tablet Take 1 tablet (40 mg total) by mouth every evening. 01/20/21 03/22/21 Yes Shahmehdi, Seyed A, MD  traMADol (ULTRAM) 50 MG tablet Take 50 mg by mouth 4 (four) times daily. 02/11/20  Yes [provider]  guaiFENesin-dextromethorphan (ROBITUSSIN DM) 100-10 MG/5ML syrup Take 10 mLs by mouth every 8 (eight) hours. Patient not taking: No sig reported 01/20/21   Deatra James, MD  methylPREDNISolone (MEDROL DOSEPAK) 4 MG TBPK tablet Medrol Dosepak take as instructed Patient not taking: Reported on 03/22/2021 01/20/21   Deatra James, MD   DG Chest 2 View  Result Date: 03/22/2021 CLINICAL DATA:  Shortness of breath.  Fall. EXAM: CHEST - 2 VIEW COMPARISON:  January 17, 2021. FINDINGS: Stable cardiomegaly with aneurysmal dilatation of the thoracic aorta. Both lungs are clear. The visualized skeletal structures are unremarkable.  IMPRESSION: Stable cardiomegaly with aneurysmal dilatation of thoracic aorta. CT scan is recommended for further evaluation. Electronically Signed   By: Marijo Conception M.D.   On: 03/22/2021 14:45   DG Pelvis 1-2 Views  Result Date: 03/22/2021 CLINICAL DATA:  Left hip pain after fall. EXAM: PELVIS - 1-2 VIEW COMPARISON:  January 03, 2020. FINDINGS: There is no evidence of acute pelvic fracture or diastasis. Status post surgical internal fixation of old proximal right femoral fracture. IMPRESSION: No acute abnormality seen. Electronically Signed   By: Marijo Conception M.D.   On: 03/22/2021 14:49   DG Forearm Left  Result Date: 03/22/2021 CLINICAL DATA:  Left forearm pain after fall. EXAM: LEFT FOREARM - 2 VIEW COMPARISON:  None. FINDINGS: Severely displaced and probably comminuted distal left radial fracture  is noted. The ulna appears grossly unremarkable. IMPRESSION: Severely displaced and probably comminuted distal left radial fracture. Electronically Signed   By: Marijo Conception M.D.   On: 03/22/2021 14:47   DG Wrist 2 Views Left  Result Date: 03/23/2021 CLINICAL DATA:  87 year old female left wrist fracture post reduction. EXAM: LEFT WRIST - 2 VIEW COMPARISON:  03/22/2021. FINDINGS: Two views with cast material in place. Improved alignment at the comminuted distal left radius fracture. Decreased impaction and dorsal angulation since the splint images yesterday. Probable ulnar styloid fracture. Underlying osteopenia. No new osseous abnormality identified. IMPRESSION: Improved and satisfactory alignment at the comminuted distal left radius fracture. Electronically Signed   By: Genevie Ann M.D.   On: 03/23/2021 09:33   DG Wrist 2 Views Left  Result Date: 03/22/2021 CLINICAL DATA:  Left wrist fracture EXAM: LEFT WRIST - 2 VIEW COMPARISON:  Left forearm radiograph dated 03/22/2021 FINDINGS: Comminuted distal radial fracture with intra-articular extension. Mild apex volar angulation. Soft tissue swelling.  Overlying cast obscures fine osseous detail. IMPRESSION: Comminuted distal radial fracture, as above.  Overlying cast. Electronically Signed   By: Julian Hy M.D.   On: 03/22/2021 22:26   CT Head Wo Contrast  Result Date: 03/22/2021 CLINICAL DATA:  An 87 year old female presents with altered mental status and potential fall. EXAM: CT HEAD WITHOUT CONTRAST TECHNIQUE: Contiguous axial images were obtained from the base of the skull through the vertex without intravenous contrast. COMPARISON:  December 31, 2019. FINDINGS: Brain: No evidence of acute infarction, hemorrhage, hydrocephalus, extra-axial collection or mass lesion/mass effect. Signs of chronic microvascular ischemic change with mild asymmetry not changed since July of 2021. Age related atrophy as before. Vascular: No hyperdense vessel or unexpected calcification. Skull: Normal. Negative for fracture or focal lesion. Sinuses/Orbits: Visualized paranasal sinuses and orbits are unremarkable. Other: None. IMPRESSION: No acute intracranial abnormality. Signs of chronic microvascular ischemic change with mild asymmetry not changed since July of 2021. Electronically Signed   By: Zetta Bills M.D.   On: 03/22/2021 14:25   CT Chest W Contrast  Result Date: 03/22/2021 CLINICAL DATA:  Chest pain. EXAM: CT CHEST WITH CONTRAST TECHNIQUE: Multidetector CT imaging of the chest was performed during intravenous contrast administration. CONTRAST:  17m OMNIPAQUE IOHEXOL 350 MG/ML SOLN COMPARISON:  Radiograph of same day. FINDINGS: Cardiovascular: 5.5 cm ascending thoracic aortic aneurysm is noted. No dissection is noted. Atherosclerosis of thoracic aorta is noted. 4.7 cm aneurysm is seen involving the distal portion of the transverse aortic arch. Mild cardiomegaly is noted. No pericardial effusion is noted. Mediastinum/Nodes: No enlarged mediastinal, hilar, or axillary lymph nodes. Thyroid gland, trachea, and esophagus demonstrate no significant findings.  Lungs/Pleura: No pneumothorax or pleural effusion is noted. Mild right apical scarring is noted. Left lung is unremarkable. Upper Abdomen: No acute abnormality. Musculoskeletal: Multiple old upper thoracic vertebral body compression fractures are noted. No acute abnormality is noted. IMPRESSION: 5.5 cm ascending thoracic aortic aneurysm. Cardiothoracic surgery consultation recommended due to increased risk of rupture for arch aneurysm ? 5.5 cm. This recommendation follows 2010 ACCF/AHA/AATS/ACR/ASA/SCA/SCAI/SIR/STS/SVM Guidelines for the Diagnosis and Management of Patients With Thoracic Aortic Disease. Circulation. 2010; 121:: [id number] Aortic aneurysm NOS (ICD10-I71.9). 4.7 cm aneurysm is seen involving distal portion of transverse aortic arch. Aortic Atherosclerosis (ICD10-I70.0). Electronically Signed   By: JMarijo ConceptionM.D.   On: 03/22/2021 19:39    Family History Reviewed and non-contributory, no pertinent history of problems with bleeding or anesthesia    Review of Systems Unable to assess  OBJECTIVE  Vitals:Patient Vitals for the past 8 hrs:  BP Temp Pulse Resp SpO2  03/23/21 1101 (!) 137/99 98.7 F (37.1 C) 95 20 92 %  03/23/21 1030 117/75 -- 80 (!) 30 (!) 88 %  03/23/21 0900 (!) 121/55 -- 86 (!) 29 90 %  03/23/21 0430 (!) 142/76 -- 91 (!) 25 90 %   General: Confused.  Does not respond to questions.  Appears comfortable. Cardiovascular: Warm extremities noted Respiratory: No cyanosis, no use of accessory musculature Neurologic: Responds to light touch Psychiatric: Patient is unable to answer questions appropriately at this time Lymphatic: No swelling obvious and reported other than the area involved in the exam below Extremities  Evaluation of the left upper extremity, upon removal of the splint demonstrates swelling and bruising over the distal forearm.  She also has bruising and swelling into her left hand.  All compartments are soft.  She tolerates passive extension of all  fingers.  2+ radial pulse.  She does responded to attempted manipulation of the left wrist.  Fingers are warm and well-perfused.  2+ radial pulse.    Test Results Imaging X-rays of the left wrist demonstrates a displaced fracture of the distal radius.  Fracture line appears to be extra-articular.  There may be intra-articular involvement.  Postreduction x-rays demonstrates adequate overall alignment.  The tilt is improved to neutral.  Radial height has been restored.  Labs cbc Recent Labs    03/22/21 1310 03/23/21 0338  WBC 5.8 6.2  HGB 11.6* 10.3*  HCT 36.7 33.8*  PLT 117* 100*    Labs inflam No results for input(s): CRP in the last 72 hours.  Invalid input(s): ESR  Labs coag No results for input(s): INR, PTT in the last 72 hours.  Invalid input(s): PT  Recent Labs    03/22/21 1310 03/23/21 0338  NA 138 140  K 2.6* 3.8  CL 102 109  CO2 25 22  GLUCOSE 81 123*  BUN 14 8  CREATININE 0.61 0.38*  CALCIUM 8.7* 7.6*

## 2021-03-23 NOTE — ED Provider Notes (Signed)
.  Ortho Injury Treatment  Date/Time: 03/23/2021 8:30 AM Performed by: Gloris Manchester, MD Authorized by: Gloris Manchester, MD  Injury location: forearm Location details: left forearm Injury type: fracture Fracture type: distal radius Pre-procedure neurovascular assessment: neurovascularly intact Pre-procedure distal perfusion: normal Pre-procedure neurological function: normal Pre-procedure range of motion: normal Anesthesia: hematoma block  Anesthesia: Local anesthesia used: yes Local Anesthetic: lidocaine 2% with epinephrine Anesthetic total: 10 mL  Patient sedated: NoManipulation performed: yes Skin traction used: yes Reduction successful: yes X-ray confirmed reduction: yes Immobilization: splint Splint type: sugar tong Splint Applied by: ED Nurse Post-procedure neurovascular assessment: post-procedure neurovascularly intact Post-procedure distal perfusion: normal Post-procedure neurological function: normal Post-procedure range of motion: normal      Gloris Manchester, MD 03/23/21 1031

## 2021-03-23 NOTE — ED Notes (Signed)
ED TO INPATIENT HANDOFF REPORT  ED Nurse Name and Phone #:   S Name/Age/Gender Denise Mendoza 87 y.o. female Room/Bed: APA03/APA03  Code Status   Code Status: DNR  Home/SNF/Other Nursing Home Patient oriented to: self Is this baseline? Yes      Chief Complaint AMS (altered mental status) [R41.82] Left wrist fracture [S62.102A]  Triage Note Family member states she is not talking right. Left hand pain   Allergies No Known Allergies  Level of Care/Admitting Diagnosis ED Disposition     ED Disposition  Admit   Condition  --   Comment  Hospital Area: [facility] [[id number]]  Level of Care: Telemetry [5]  Covid Evaluation: Asymptomatic Screening Protocol (No Symptoms)  Diagnosis: Left wrist fracture [427062]  Admitting Physician: Onnie Boer [[id number]]  Attending Physician: Onnie Boer [phone]  Estimated length of stay: past midnight tomorrow  Certification:: I certify this patient will need inpatient services for at least 2 midnights          B Medical/Surgery History Past Medical History:  Diagnosis Date   Hyperlipidemia    Hypertension    Lower back pain Oct 2013   Occlusive disease, arterial    Peripheral vascular disease (HCC)    Stress Oct. 2013   Dr. Ignatius Specking   Past Surgical History:  Procedure Laterality Date   ABDOMINAL HYSTERECTOMY     BACK SURGERY     HIP PINNING,CANNULATED Right 01/03/2020   Procedure: OPEN TREATMENT INTERNAL FIXATION RIGHT HIP;  Surgeon: Vickki Hearing, MD;  Location: [facility];  Service: Orthopedics;  Laterality: Right;   NOSE SURGERY     TUMOR REMOVED FROM FINGER       A IV Location/Drains/Wounds Patient Lines/Drains/Airways Status     Active Line/Drains/Airways     Name Placement date Placement time Site Days   Peripheral IV 03/22/21 22 G Posterior;Right Forearm 03/22/21  1601  Forearm  1   External Urinary Catheter 03/22/21  1331  --  1   Incision (Closed) 01/03/20 Hip Right  01/03/20  1446  -- 445            Intake/Output Last 24 hours  Intake/Output Summary (Last 24 hours) at 03/23/2021 0958 Last data filed at 03/23/2021 1761 Gross per 24 hour  Intake 1739.99 ml  Output --  Net 1739.99 ml    Labs/Imaging Results for orders placed or performed during the hospital encounter of 03/22/21 (from the past 48 hour(s))  CBC with Differential/Platelet     Status: Abnormal   Collection Time: 03/22/21  1:10 PM  Result Value Ref Range   WBC 5.8 4.0 - 10.5 K/uL   RBC 3.82 (L) 3.87 - 5.11 MIL/uL   Hemoglobin 11.6 (L) 12.0 - 15.0 g/dL   HCT 60.7 37.1 - 06.2 %   MCV 96.1 80.0 - 100.0 fL   MCH 30.4 26.0 - 34.0 pg   MCHC 31.6 30.0 - 36.0 g/dL   RDW 69.4 (H) 85.4 - 62.7 %   Platelets 117 (L) 150 - 400 K/uL    Comment: SPECIMEN CHECKED FOR CLOTS REPEATED TO VERIFY PLATELET COUNT CONFIRMED BY SMEAR    nRBC 0.0 0.0 - 0.2 %   Neutrophils Relative % 79 %   Neutro Abs 4.6 1.7 - 7.7 K/uL   Lymphocytes Relative 13 %   Lymphs Abs 0.8 0.7 - 4.0 K/uL   Monocytes Relative 7 %   Monocytes Absolute 0.4 0.1 - 1.0 K/uL   Eosinophils Relative 0 %  Eosinophils Absolute 0.0 0.0 - 0.5 K/uL   Basophils Relative 1 %   Basophils Absolute 0.0 0.0 - 0.1 K/uL   Smear Review MORPHOLOGY UNREMARKABLE    Immature Granulocytes 0 %   Abs Immature Granulocytes 0.01 0.00 - 0.07 K/uL   Ovalocytes PRESENT     Comment: Performed at [facility], [address], [place], [place] [address]  Comprehensive metabolic panel     Status: Abnormal   Collection Time: 03/22/21  1:10 PM  Result Value Ref Range   Sodium 138 135 - 145 mmol/L   Potassium 2.6 (LL) 3.5 - 5.1 mmol/L    Comment: CRITICAL RESULT CALLED TO, READ BACK BY AND VERIFIED WITH: L LONG RN [id number] K FORSYTH    Chloride 102 98 - 111 mmol/L   CO2 25 22 - 32 mmol/L   Glucose, Bld 81 70 - 99 mg/dL    Comment: Glucose reference range applies only to samples taken after fasting for at least 8 hours.   BUN 14 8 - 23 mg/dL    Creatinine, Ser 3.15 0.44 - 1.00 mg/dL   Calcium 8.7 (L) 8.9 - 10.3 mg/dL   Total Protein 6.7 6.5 - 8.1 g/dL   Albumin 3.8 3.5 - 5.0 g/dL   AST 24 15 - 41 U/L   ALT 12 0 - 44 U/L   Alkaline Phosphatase 58 38 - 126 U/L   Total Bilirubin 1.7 (H) 0.3 - 1.2 mg/dL   GFR, Estimated >40 >08 mL/min    Comment: (NOTE) Calculated using the CKD-EPI Creatinine Equation (2021)    Anion gap 11 5 - 15    Comment: Performed at [facility], [address], [place], [place] [address]  Magnesium     Status: Abnormal   Collection Time: 03/22/21  1:10 PM  Result Value Ref Range   Magnesium 1.4 (L) 1.7 - 2.4 mg/dL    Comment: Performed at [facility], [address] [place], [place] [address]  Urinalysis, Routine w reflex microscopic     Status: Abnormal   Collection Time: 03/22/21  3:42 PM  Result Value Ref Range   Color, Urine AMBER (A) YELLOW    Comment: BIOCHEMICALS MAY BE AFFECTED BY COLOR   APPearance HAZY (A) CLEAR   Specific Gravity, Urine 1.018 1.005 - 1.030   pH 5.0 5.0 - 8.0   Glucose, UA NEGATIVE NEGATIVE mg/dL   Hgb urine dipstick MODERATE (A) NEGATIVE   Bilirubin Urine NEGATIVE NEGATIVE   Ketones, ur 80 (A) NEGATIVE mg/dL   Protein, ur 671 (A) NEGATIVE mg/dL   Nitrite NEGATIVE NEGATIVE   Leukocytes,Ua NEGATIVE NEGATIVE   RBC / HPF 0-5 0 - 5 RBC/hpf   WBC, UA 11-20 0 - 5 WBC/hpf   Bacteria, UA RARE (A) NONE SEEN   Mucus PRESENT     Comment: Performed at [facility], [address], [place], [place] [address]  Rapid urine drug screen (hospital performed)     Status: None   Collection Time: 03/22/21  3:42 PM  Result Value Ref Range   Opiates NONE DETECTED NONE DETECTED   Cocaine NONE DETECTED NONE DETECTED   Benzodiazepines NONE DETECTED NONE DETECTED   Amphetamines NONE DETECTED NONE DETECTED   Tetrahydrocannabinol NONE DETECTED NONE DETECTED   Barbiturates NONE DETECTED NONE DETECTED    Comment: (NOTE) DRUG SCREEN FOR MEDICAL PURPOSES ONLY.  IF CONFIRMATION IS NEEDED FOR  ANY PURPOSE, NOTIFY LAB WITHIN 5 DAYS.  LOWEST DETECTABLE LIMITS FOR URINE DRUG SCREEN Drug Class  Cutoff (ng/mL) Amphetamine and metabolites    1000 Barbiturate and metabolites    200 Benzodiazepine                 200 Tricyclics and metabolites     300 Opiates and metabolites        300 Cocaine and metabolites        300 THC                            50 Performed at [facility], [address], [place], [place] [address]   Resp Panel by RT-PCR (Flu A&B, Covid) Nasopharyngeal Swab     Status: None   Collection Time: 03/22/21  5:32 PM   Specimen: Nasopharyngeal Swab; Nasopharyngeal(NP) swabs in vial transport medium  Result Value Ref Range   SARS Coronavirus 2 by RT PCR NEGATIVE NEGATIVE    Comment: (NOTE) SARS-CoV-2 target nucleic acids are NOT DETECTED.  The SARS-CoV-2 RNA is generally detectable in upper respiratory specimens during the acute phase of infection. The lowest concentration of SARS-CoV-2 viral copies this assay can detect is 138 copies/mL. A negative result does not preclude SARS-Cov-2 infection and should not be used as the sole basis for treatment or other patient management decisions. A negative result may occur with  improper specimen collection/handling, submission of specimen other than nasopharyngeal swab, presence of viral mutation(s) within the areas targeted by this assay, and inadequate number of viral copies(<138 copies/mL). A negative result must be combined with clinical observations, patient history, and epidemiological information. The expected result is Negative.  Fact Sheet for Patients:  BloggerCourse.com  Fact Sheet for Healthcare Providers:  SeriousBroker.it  This test is no t yet approved or cleared by the [place] FDA and  has been authorized for detection and/or diagnosis of SARS-CoV-2 by FDA under an Emergency Use Authorization (EUA). This EUA will remain  in  effect (meaning this test can be used) for the duration of the COVID-19 declaration under Section 564(b)(1) of the Act, 21 U.S.C.section 360bbb-3(b)(1), unless the authorization is terminated  or revoked sooner.       Influenza A by PCR NEGATIVE NEGATIVE   Influenza B by PCR NEGATIVE NEGATIVE    Comment: (NOTE) The Xpert Xpress SARS-CoV-2/FLU/RSV plus assay is intended as an aid in the diagnosis of influenza from Nasopharyngeal swab specimens and should not be used as a sole basis for treatment. Nasal washings and aspirates are unacceptable for Xpert Xpress SARS-CoV-2/FLU/RSV testing.  Fact Sheet for Patients: BloggerCourse.com  Fact Sheet for Healthcare Providers: SeriousBroker.it  This test is not yet approved or cleared by the [place] FDA and has been authorized for detection and/or diagnosis of SARS-CoV-2 by FDA under an Emergency Use Authorization (EUA). This EUA will remain in effect (meaning this test can be used) for the duration of the COVID-19 declaration under Section 564(b)(1) of the Act, 21 U.S.C. section 360bbb-3(b)(1), unless the authorization is terminated or revoked.  Performed at [facility], [address], [place], [place] [address]   CBG monitoring, ED     Status: Abnormal   Collection Time: 03/22/21 10:22 PM  Result Value Ref Range   Glucose-Capillary 69 (L) 70 - 99 mg/dL    Comment: Glucose reference range applies only to samples taken after fasting for at least 8 hours.  Basic metabolic panel     Status: Abnormal   Collection Time: 03/23/21  3:38 AM  Result Value Ref Range  Sodium 140 135 - 145 mmol/L   Potassium 3.8 3.5 - 5.1 mmol/L    Comment: DELTA CHECK NOTED   Chloride 109 98 - 111 mmol/L   CO2 22 22 - 32 mmol/L   Glucose, Bld 123 (H) 70 - 99 mg/dL    Comment: Glucose reference range applies only to samples taken after fasting for at least 8 hours.   BUN 8 8 - 23 mg/dL   Creatinine,  Ser 4.09 (L) 0.44 - 1.00 mg/dL   Calcium 7.6 (L) 8.9 - 10.3 mg/dL   GFR, Estimated >81 >19 mL/min    Comment: (NOTE) Calculated using the CKD-EPI Creatinine Equation (2021)    Anion gap 9 5 - 15    Comment: Performed at [facility], [address], [place], [place] [address]  CBC     Status: Abnormal   Collection Time: 03/23/21  3:38 AM  Result Value Ref Range   WBC 6.2 4.0 - 10.5 K/uL   RBC 3.39 (L) 3.87 - 5.11 MIL/uL   Hemoglobin 10.3 (L) 12.0 - 15.0 g/dL   HCT 95.6 (L) 21.3 - 08.6 %   MCV 99.7 80.0 - 100.0 fL   MCH 30.4 26.0 - 34.0 pg   MCHC 30.5 30.0 - 36.0 g/dL   RDW 57.8 (H) 46.9 - 62.9 %   Platelets 100 (L) 150 - 400 K/uL    Comment: Immature Platelet Fraction may be clinically indicated, consider ordering this additional test [id number] CONSISTENT WITH PREVIOUS RESULT REPEATED TO VERIFY    nRBC 0.0 0.0 - 0.2 %    Comment: Performed at [facility], [address] [place], [place] [address]   DG Chest 2 View  Result Date: 03/22/2021 CLINICAL DATA:  Shortness of breath.  Fall. EXAM: CHEST - 2 VIEW COMPARISON:  January 17, 2021. FINDINGS: Stable cardiomegaly with aneurysmal dilatation of the thoracic aorta. Both lungs are clear. The visualized skeletal structures are unremarkable. IMPRESSION: Stable cardiomegaly with aneurysmal dilatation of thoracic aorta. CT scan is recommended for further evaluation. Electronically Signed   By: Lupita Raider M.D.   On: 03/22/2021 14:45   DG Pelvis 1-2 Views  Result Date: 03/22/2021 CLINICAL DATA:  Left hip pain after fall. EXAM: PELVIS - 1-2 VIEW COMPARISON:  January 03, 2020. FINDINGS: There is no evidence of acute pelvic fracture or diastasis. Status post surgical internal fixation of old proximal right femoral fracture. IMPRESSION: No acute abnormality seen. Electronically Signed   By: Lupita Raider M.D.   On: 03/22/2021 14:49   DG Forearm Left  Result Date: 03/22/2021 CLINICAL DATA:  Left forearm pain after fall. EXAM: LEFT  FOREARM - 2 VIEW COMPARISON:  None. FINDINGS: Severely displaced and probably comminuted distal left radial fracture is noted. The ulna appears grossly unremarkable. IMPRESSION: Severely displaced and probably comminuted distal left radial fracture. Electronically Signed   By: Lupita Raider M.D.   On: 03/22/2021 14:47   DG Wrist 2 Views Left  Result Date: 03/23/2021 CLINICAL DATA:  87 year old female left wrist fracture post reduction. EXAM: LEFT WRIST - 2 VIEW COMPARISON:  03/22/2021. FINDINGS: Two views with cast material in place. Improved alignment at the comminuted distal left radius fracture. Decreased impaction and dorsal angulation since the splint images yesterday. Probable ulnar styloid fracture. Underlying osteopenia. No new osseous abnormality identified. IMPRESSION: Improved and satisfactory alignment at the comminuted distal left radius fracture. Electronically Signed   By: Odessa Fleming M.D.   On: 03/23/2021 09:33   DG Wrist 2  Views Left  Result Date: 03/22/2021 CLINICAL DATA:  Left wrist fracture EXAM: LEFT WRIST - 2 VIEW COMPARISON:  Left forearm radiograph dated 03/22/2021 FINDINGS: Comminuted distal radial fracture with intra-articular extension. Mild apex volar angulation. Soft tissue swelling. Overlying cast obscures fine osseous detail. IMPRESSION: Comminuted distal radial fracture, as above.  Overlying cast. Electronically Signed   By: Charline Bills M.D.   On: 03/22/2021 22:26   CT Head Wo Contrast  Result Date: 03/22/2021 CLINICAL DATA:  An 87 year old female presents with altered mental status and potential fall. EXAM: CT HEAD WITHOUT CONTRAST TECHNIQUE: Contiguous axial images were obtained from the base of the skull through the vertex without intravenous contrast. COMPARISON:  December 31, 2019. FINDINGS: Brain: No evidence of acute infarction, hemorrhage, hydrocephalus, extra-axial collection or mass lesion/mass effect. Signs of chronic microvascular ischemic change with mild  asymmetry not changed since July of 2021. Age related atrophy as before. Vascular: No hyperdense vessel or unexpected calcification. Skull: Normal. Negative for fracture or focal lesion. Sinuses/Orbits: Visualized paranasal sinuses and orbits are unremarkable. Other: None. IMPRESSION: No acute intracranial abnormality. Signs of chronic microvascular ischemic change with mild asymmetry not changed since July of 2021. Electronically Signed   By: Donzetta Kohut M.D.   On: 03/22/2021 14:25   CT Chest W Contrast  Result Date: 03/22/2021 CLINICAL DATA:  Chest pain. EXAM: CT CHEST WITH CONTRAST TECHNIQUE: Multidetector CT imaging of the chest was performed during intravenous contrast administration. CONTRAST:  21mL OMNIPAQUE IOHEXOL 350 MG/ML SOLN COMPARISON:  Radiograph of same day. FINDINGS: Cardiovascular: 5.5 cm ascending thoracic aortic aneurysm is noted. No dissection is noted. Atherosclerosis of thoracic aorta is noted. 4.7 cm aneurysm is seen involving the distal portion of the transverse aortic arch. Mild cardiomegaly is noted. No pericardial effusion is noted. Mediastinum/Nodes: No enlarged mediastinal, hilar, or axillary lymph nodes. Thyroid gland, trachea, and esophagus demonstrate no significant findings. Lungs/Pleura: No pneumothorax or pleural effusion is noted. Mild right apical scarring is noted. Left lung is unremarkable. Upper Abdomen: No acute abnormality. Musculoskeletal: Multiple old upper thoracic vertebral body compression fractures are noted. No acute abnormality is noted. IMPRESSION: 5.5 cm ascending thoracic aortic aneurysm. Cardiothoracic surgery consultation recommended due to increased risk of rupture for arch aneurysm ? 5.5 cm. This recommendation follows 2010 ACCF/AHA/AATS/ACR/ASA/SCA/SCAI/SIR/STS/SVM Guidelines for the Diagnosis and Management of Patients With Thoracic Aortic Disease. Circulation. 2010; 121: K025-K270. Aortic aneurysm NOS (ICD10-I71.9). 4.7 cm aneurysm is seen  involving distal portion of transverse aortic arch. Aortic Atherosclerosis (ICD10-I70.0). Electronically Signed   By: Lupita Raider M.D.   On: 03/22/2021 19:39    Pending Labs Unresulted Labs (From admission, onward)    None       Vitals/Pain Today's Vitals   03/23/21 0330 03/23/21 0400 03/23/21 0430 03/23/21 0900  BP: 101/64 (!) 137/92 (!) 142/76 (!) 121/55  Pulse: 90 86 91 86  Resp: (!) 23 20 (!) 25 (!) 29  Temp:      TempSrc:      SpO2: 91% 92% 90% 90%  PainSc:        Isolation Precautions No active isolations  Medications Medications  morphine 2 MG/ML injection 2 mg (2 mg Intravenous Given 03/23/21 0708)  metoprolol tartrate (LOPRESSOR) tablet 25 mg (25 mg Oral Given 03/22/21 2307)  Ipratropium-Albuterol (COMBIVENT) respimat 1 puff (has no administration in time range)  acetaminophen (TYLENOL) tablet 650 mg (has no administration in time range)    Or  acetaminophen (TYLENOL) suppository 650 mg (has no administration in time  range)  ondansetron (ZOFRAN) tablet 4 mg (has no administration in time range)    Or  ondansetron (ZOFRAN) injection 4 mg (has no administration in time range)  polyethylene glycol (MIRALAX / GLYCOLAX) packet 17 g (has no administration in time range)  dextrose 5 % and 0.9 % NaCl with KCl 40 mEq/L infusion (0 mLs Intravenous Stopped 03/23/21 0926)  povidone-iodine (BETADINE) 10 % external solution (  Not Given 03/23/21 0928)  potassium chloride 10 mEq in 100 mL IVPB (0 mEq Intravenous Stopped 03/22/21 1718)  magnesium sulfate IVPB 2 g 50 mL (0 g Intravenous Stopped 03/22/21 1824)  potassium chloride 10 mEq in 100 mL IVPB (0 mEq Intravenous Stopped 03/22/21 1824)  morphine 2 MG/ML injection 2 mg (2 mg Intravenous Given 03/22/21 1848)  iohexol (OMNIPAQUE) 350 MG/ML injection 60 mL (60 mLs Intravenous Contrast Given 03/22/21 1911)  potassium chloride 10 mEq in 100 mL IVPB (0 mEq Intravenous Stopped 03/23/21 0113)  sodium chloride 0.9 % bolus 500 mL  (0 mLs Intravenous Stopped 03/22/21 2314)  LORazepam (ATIVAN) injection 0.5 mg (0.5 mg Intravenous Given 03/23/21 0708)  dextrose 50 % solution 50 mL (50 mLs Intravenous Given 03/22/21 2253)  lidocaine-EPINEPHrine (XYLOCAINE W/EPI) 2 %-1:200000 (PF) injection 10 mL (10 mLs Intradermal Given 03/23/21 0900)    Mobility non-ambulatory High fall risk   Focused Assessments    R Recommendations: See Admitting Provider Note  Report given to:   Additional Notes:

## 2021-03-23 NOTE — Progress Notes (Signed)
Patient Demographics:    Denise Mendoza, is a 87 y.o. female, DOB - 1931-06-04, CXK:[id number]  Admit date - 03/22/2021   Admitting Physician Ejiroghene Wendall Stade, MD  Outpatient Primary MD for the patient is Ignatius Specking, MD  LOS - 1   Chief Complaint  Patient presents with   Altered Mental Status        Subjective:    Ignatius Specking today has no fevers, no emesis,  No chest pain,   Lt arm swelling noted -Poor historian due to advanced dementia  Assessment  & Plan :    Principal Problem:   Acute metabolic encephalopathy Active Problems:   Essential hypertension   Hyperlipidemia   Pulmonary emphysema (HCC)   Left wrist fracture, closed, initial encounter   Left wrist fracture  Brief Summary:- 87 y.o. female with medical history significant for hypertension, peripheral vascular disease admitted on 03/22/2021 with left wrist fracture, electrolyte abnormalities and acute metabolic encephalopathy superimposed on underlying dementia  A/p 1) hypokalemia/hypomagnesemia/duration/hypoglycemia-- -potassium is up to 3.8 from 2.6 after replacement -Magnesium was 1.4,  repeat -Continue D5 1/2 NS IV fluids until oral intake is more reliable  2) acute metabolic encephalopathy superimposed on dementia- UA may be suspicious for UTI Vs contaminated- -Give IV Rocephin pending urine culture  3) left wrist fracture status post   fall--EDP reduced fracture with imaging suggesting better alignment orthopedic consult appreciated with recommendations as follows:-NWB LUE, keep splint clean, dry and intact.  Elevate that hand as much as possible to aid in swelling of the hand.  -Continue as needed pain meds  4) thoracic aortic aneurysm--- originally ascending thoracic aortic system with 5.5 cm -Admitting physician discussed with CTS surgeon- Dr. Dorris Fetch, patient should follow-up with cardiothoracic surgery  at some point, but this does not need to be done urgently  - 5)COPD--- continue bronchodilators  6)HTN--continue metoprolol  7)Socia/Ethics--- admitting provider confirmed DNR status with patient's son Fredrik Cove over the phone  Disposition/Need for in-Hospital Stay- patient unable to be discharged at this time due to metabolic encephalopathy possible UTI requiring IV antibiotics, dehydration with hypoglycemia electrolytes are normal at this requiring IV fluids -Possible discharge after resolution of metabolic encephalopathy and improvement in electrolyte derangement and dehydration*  Status is: Inpatient  Remains inpatient appropriate because: Please see disposition as above  Disposition: The patient is from: SNF              Anticipated d/c is to: SNF              Anticipated d/c date is: 2 days              Patient currently is not medically stable to d/c. Barriers: Not Clinically Stable-   Code Status :  -  Code Status: DNR   Family Communication:    Son Fredrik Cove  is Product manager) Consults  :  ortho  DVT Prophylaxis  :   - SCDs  SCDs Start: 03/22/21 2208    Lab Results  Component Value Date   PLT 100 (L) 03/23/2021    Inpatient Medications  Scheduled Meds:  metoprolol tartrate  25 mg Oral BID   povidone-iodine       Continuous Infusions: PRN Meds:.acetaminophen **OR** acetaminophen, Ipratropium-Albuterol, morphine injection, ondansetron **OR**  ondansetron (ZOFRAN) IV, polyethylene glycol    Anti-infectives (From admission, onward)    None         Objective:   Vitals:   03/23/21 0900 03/23/21 1030 03/23/21 1101 03/23/21 1425  BP: (!) 121/55 117/75 (!) 137/99 (!) 162/54  Pulse: 86 80 95 (!) 105  Resp: (!) 29 (!) 30 20 20   Temp:   98.7 F (37.1 C) 97.8 F (36.6 C)  TempSrc:      SpO2: 90% (!) 88% 92% 93%    Wt Readings from Last 3 Encounters:  01/17/21 46.2 kg  03/13/20 48.1 kg  01/01/20 48.8 kg     Intake/Output Summary (Last 24 hours) at 03/23/2021  1618 Last data filed at 03/23/2021 1300 Gross per 24 hour  Intake 1739.99 ml  Output --  Net 1739.99 ml     Physical Exam  Gen:- Awake Alert,  in no apparent distress  HEENT:- [place].AT, No sclera icterus Neck-Supple Neck,No JVD,.  Lungs-  CTAB , fair symmetrical air movement CV- S1, S2 normal, regular  Abd-  +ve B.Sounds, Abd Soft, No tenderness,    Extremity/Skin:- No  edema, pedal pulses present  Psych-pleasantly confused and disoriented  neuro-generalized weakness, no new focal deficits, no tremors MSK-left wrist/upper extremity in splint, swollen fingers with cap refill and no evidence of compartment syndrome clinically   Data Review:   Micro Results Recent Results (from the past 240 hour(s))  Resp Panel by RT-PCR (Flu A&B, Covid) Nasopharyngeal Swab     Status: None   Collection Time: 03/22/21  5:32 PM   Specimen: Nasopharyngeal Swab; Nasopharyngeal(NP) swabs in vial transport medium  Result Value Ref Range Status   SARS Coronavirus 2 by RT PCR NEGATIVE NEGATIVE Final    Comment: (NOTE) SARS-CoV-2 target nucleic acids are NOT DETECTED.  The SARS-CoV-2 RNA is generally detectable in upper respiratory specimens during the acute phase of infection. The lowest concentration of SARS-CoV-2 viral copies this assay can detect is 138 copies/mL. A negative result does not preclude SARS-Cov-2 infection and should not be used as the sole basis for treatment or other patient management decisions. A negative result may occur with  improper specimen collection/handling, submission of specimen other than nasopharyngeal swab, presence of viral mutation(s) within the areas targeted by this assay, and inadequate number of viral copies(<138 copies/mL). A negative result must be combined with clinical observations, patient history, and epidemiological information. The expected result is Negative.  Fact Sheet for Patients:  03/24/21  Fact Sheet for  Healthcare Providers:  BloggerCourse.com  This test is no t yet approved or cleared by the [employer] FDA and  has been authorized for detection and/or diagnosis of SARS-CoV-2 by FDA under an Emergency Use Authorization (EUA). This EUA will remain  in effect (meaning this test can be used) for the duration of the COVID-19 declaration under Section 564(b)(1) of the Act, 21 U.S.C.section 360bbb-3(b)(1), unless the authorization is terminated  or revoked sooner.       Influenza A by PCR NEGATIVE NEGATIVE Final   Influenza B by PCR NEGATIVE NEGATIVE Final    Comment: (NOTE) The Xpert Xpress SARS-CoV-2/FLU/RSV plus assay is intended as an aid in the diagnosis of influenza from Nasopharyngeal swab specimens and should not be used as a sole basis for treatment. Nasal washings and aspirates are unacceptable for Xpert Xpress SARS-CoV-2/FLU/RSV testing.  Fact Sheet for Patients: [place]  Fact Sheet for Healthcare Providers: BloggerCourse.com  This test is not yet approved or cleared by the [employer]  States FDA and has been authorized for detection and/or diagnosis of SARS-CoV-2 by FDA under an Emergency Use Authorization (EUA). This EUA will remain in effect (meaning this test can be used) for the duration of the COVID-19 declaration under Section 564(b)(1) of the Act, 21 U.S.C. section 360bbb-3(b)(1), unless the authorization is terminated or revoked.  Performed at [facility], [address], [place], [place] [address]     Radiology Reports DG Chest 2 View  Result Date: 03/22/2021 CLINICAL DATA:  Shortness of breath.  Fall. EXAM: CHEST - 2 VIEW COMPARISON:  January 17, 2021. FINDINGS: Stable cardiomegaly with aneurysmal dilatation of the thoracic aorta. Both lungs are clear. The visualized skeletal structures are unremarkable. IMPRESSION: Stable cardiomegaly with aneurysmal dilatation of thoracic  aorta. CT scan is recommended for further evaluation. Electronically Signed   By: Lupita Raider M.D.   On: 03/22/2021 14:45   DG Pelvis 1-2 Views  Result Date: 03/22/2021 CLINICAL DATA:  Left hip pain after fall. EXAM: PELVIS - 1-2 VIEW COMPARISON:  January 03, 2020. FINDINGS: There is no evidence of acute pelvic fracture or diastasis. Status post surgical internal fixation of old proximal right femoral fracture. IMPRESSION: No acute abnormality seen. Electronically Signed   By: Lupita Raider M.D.   On: 03/22/2021 14:49   DG Forearm Left  Result Date: 03/22/2021 CLINICAL DATA:  Left forearm pain after fall. EXAM: LEFT FOREARM - 2 VIEW COMPARISON:  None. FINDINGS: Severely displaced and probably comminuted distal left radial fracture is noted. The ulna appears grossly unremarkable. IMPRESSION: Severely displaced and probably comminuted distal left radial fracture. Electronically Signed   By: Lupita Raider M.D.   On: 03/22/2021 14:47   DG Wrist 2 Views Left  Result Date: 03/23/2021 CLINICAL DATA:  87 year old female left wrist fracture post reduction. EXAM: LEFT WRIST - 2 VIEW COMPARISON:  03/22/2021. FINDINGS: Two views with cast material in place. Improved alignment at the comminuted distal left radius fracture. Decreased impaction and dorsal angulation since the splint images yesterday. Probable ulnar styloid fracture. Underlying osteopenia. No new osseous abnormality identified. IMPRESSION: Improved and satisfactory alignment at the comminuted distal left radius fracture. Electronically Signed   By: Odessa Fleming M.D.   On: 03/23/2021 09:33   DG Wrist 2 Views Left  Result Date: 03/22/2021 CLINICAL DATA:  Left wrist fracture EXAM: LEFT WRIST - 2 VIEW COMPARISON:  Left forearm radiograph dated 03/22/2021 FINDINGS: Comminuted distal radial fracture with intra-articular extension. Mild apex volar angulation. Soft tissue swelling. Overlying cast obscures fine osseous detail. IMPRESSION: Comminuted  distal radial fracture, as above.  Overlying cast. Electronically Signed   By: Charline Bills M.D.   On: 03/22/2021 22:26   CT Head Wo Contrast  Result Date: 03/22/2021 CLINICAL DATA:  An 87 year old female presents with altered mental status and potential fall. EXAM: CT HEAD WITHOUT CONTRAST TECHNIQUE: Contiguous axial images were obtained from the base of the skull through the vertex without intravenous contrast. COMPARISON:  December 31, 2019. FINDINGS: Brain: No evidence of acute infarction, hemorrhage, hydrocephalus, extra-axial collection or mass lesion/mass effect. Signs of chronic microvascular ischemic change with mild asymmetry not changed since July of 2021. Age related atrophy as before. Vascular: No hyperdense vessel or unexpected calcification. Skull: Normal. Negative for fracture or focal lesion. Sinuses/Orbits: Visualized paranasal sinuses and orbits are unremarkable. Other: None. IMPRESSION: No acute intracranial abnormality. Signs of chronic microvascular ischemic change with mild asymmetry not changed since July of 2021. Electronically Signed   By: Donzetta Kohut  M.D.   On: 03/22/2021 14:25   CT Chest W Contrast  Result Date: 03/22/2021 CLINICAL DATA:  Chest pain. EXAM: CT CHEST WITH CONTRAST TECHNIQUE: Multidetector CT imaging of the chest was performed during intravenous contrast administration. CONTRAST:  54mL OMNIPAQUE IOHEXOL 350 MG/ML SOLN COMPARISON:  Radiograph of same day. FINDINGS: Cardiovascular: 5.5 cm ascending thoracic aortic aneurysm is noted. No dissection is noted. Atherosclerosis of thoracic aorta is noted. 4.7 cm aneurysm is seen involving the distal portion of the transverse aortic arch. Mild cardiomegaly is noted. No pericardial effusion is noted. Mediastinum/Nodes: No enlarged mediastinal, hilar, or axillary lymph nodes. Thyroid gland, trachea, and esophagus demonstrate no significant findings. Lungs/Pleura: No pneumothorax or pleural effusion is noted. Mild right  apical scarring is noted. Left lung is unremarkable. Upper Abdomen: No acute abnormality. Musculoskeletal: Multiple old upper thoracic vertebral body compression fractures are noted. No acute abnormality is noted. IMPRESSION: 5.5 cm ascending thoracic aortic aneurysm. Cardiothoracic surgery consultation recommended due to increased risk of rupture for arch aneurysm ? 5.5 cm. This recommendation follows 2010 ACCF/AHA/AATS/ACR/ASA/SCA/SCAI/SIR/STS/SVM Guidelines for the Diagnosis and Management of Patients With Thoracic Aortic Disease. Circulation. 2010; 121: S923-R007. Aortic aneurysm NOS (ICD10-I71.9). 4.7 cm aneurysm is seen involving distal portion of transverse aortic arch. Aortic Atherosclerosis (ICD10-I70.0). Electronically Signed   By: Lupita Raider M.D.   On: 03/22/2021 19:39     CBC Recent Labs  Lab 03/22/21 1310 03/23/21 0338  WBC 5.8 6.2  HGB 11.6* 10.3*  HCT 36.7 33.8*  PLT 117* 100*  MCV 96.1 99.7  MCH 30.4 30.4  MCHC 31.6 30.5  RDW 15.9* 15.9*  LYMPHSABS 0.8  --   MONOABS 0.4  --   EOSABS 0.0  --   BASOSABS 0.0  --     Chemistries  Recent Labs  Lab 03/22/21 1310 03/23/21 0338  NA 138 140  K 2.6* 3.8  CL 102 109  CO2 25 22  GLUCOSE 81 123*  BUN 14 8  CREATININE 0.61 0.38*  CALCIUM 8.7* 7.6*  MG 1.4*  --   AST 24  --   ALT 12  --   ALKPHOS 58  --   BILITOT 1.7*  --    ------------------------------------------------------------------------------------------------------------------ No results for input(s): CHOL, HDL, LDLCALC, TRIG, CHOLHDL, LDLDIRECT in the last 72 hours.  No results found for: HGBA1C ------------------------------------------------------------------------------------------------------------------ No results for input(s): TSH, T4TOTAL, T3FREE, THYROIDAB in the last 72 hours.  Invalid input(s): FREET3 ------------------------------------------------------------------------------------------------------------------ No results for input(s):  VITAMINB12, FOLATE, FERRITIN, TIBC, IRON, RETICCTPCT in the last 72 hours.  Coagulation profile No results for input(s): INR, PROTIME in the last 168 hours.  No results for input(s): DDIMER in the last 72 hours.  Cardiac Enzymes No results for input(s): CKMB, TROPONINI, MYOGLOBIN in the last 168 hours.  Invalid input(s): CK ------------------------------------------------------------------------------------------------------------------    Component Value Date/Time   BNP 756.0 (H) 01/04/2020 6226     Shon Hale M.D on 03/23/2021 at 4:18 PM  Go to [email or web address] - for contact info  [facility] - Office  [phone]

## 2021-03-24 DIAGNOSIS — G9341 Metabolic encephalopathy: Secondary | ICD-10-CM | POA: Diagnosis not present

## 2021-03-24 LAB — RENAL FUNCTION PANEL
Albumin: 3.1 g/dL — ABNORMAL LOW (ref 3.5–5.0)
Anion gap: 7 (ref 5–15)
BUN: 7 mg/dL — ABNORMAL LOW (ref 8–23)
CO2: 27 mmol/L (ref 22–32)
Calcium: 8 mg/dL — ABNORMAL LOW (ref 8.9–10.3)
Chloride: 104 mmol/L (ref 98–111)
Creatinine, Ser: 0.43 mg/dL — ABNORMAL LOW (ref 0.44–1.00)
GFR, Estimated: >60 mL/min (ref >60)
Glucose, Bld: 111 mg/dL — ABNORMAL HIGH (ref 70–99)
Phosphorus: 2.3 mg/dL — ABNORMAL LOW (ref 2.5–4.6)
Potassium: 3.1 mmol/L — ABNORMAL LOW (ref 3.5–5.1)
Sodium: 138 mmol/L (ref 135–145)

## 2021-03-24 LAB — CBC
HCT: 35.1 % — ABNORMAL LOW (ref 36.0–46.0)
Hemoglobin: 11 g/dL — ABNORMAL LOW (ref 12.0–15.0)
MCH: 30.3 pg (ref 26.0–34.0)
MCHC: 31.3 g/dL (ref 30.0–36.0)
MCV: 96.7 fL (ref 80.0–100.0)
Platelets: 92 10*3/uL — ABNORMAL LOW (ref 150–400)
RBC: 3.63 MIL/uL — ABNORMAL LOW (ref 3.87–5.11)
RDW: 15.8 % — ABNORMAL HIGH (ref 11.5–15.5)
WBC: 7.5 10*3/uL (ref 4.0–10.5)
nRBC: 0 % (ref 0.0–0.2)

## 2021-03-24 MED ORDER — SIMVASTATIN 20 MG PO TABS
20.0000 mg | ORAL_TABLET | Freq: Every day | ORAL | Status: DC
  Administered 2021-03-24 – 2021-03-25 (×2): 20 mg via ORAL
  Filled 2021-03-24 (×3): qty 1

## 2021-03-24 MED ORDER — ALPRAZOLAM 0.25 MG PO TABS
0.2500 mg | ORAL_TABLET | Freq: Once | ORAL | Status: AC
  Administered 2021-03-24: 0.25 mg via ORAL
  Filled 2021-03-24: qty 1

## 2021-03-24 MED ORDER — ALPRAZOLAM 0.5 MG PO TABS
0.5000 mg | ORAL_TABLET | Freq: Every day | ORAL | Status: DC
  Administered 2021-03-24: 0.5 mg via ORAL
  Filled 2021-03-24: qty 1

## 2021-03-24 MED ORDER — METOPROLOL SUCCINATE ER 50 MG PO TB24
50.0000 mg | ORAL_TABLET | Freq: Every day | ORAL | Status: DC
  Administered 2021-03-24 – 2021-03-25 (×2): 50 mg via ORAL
  Filled 2021-03-24 (×3): qty 1

## 2021-03-24 MED ORDER — ASPIRIN EC 81 MG PO TBEC
81.0000 mg | DELAYED_RELEASE_TABLET | Freq: Every day | ORAL | Status: DC
  Administered 2021-03-25 – 2021-03-26 (×2): 81 mg via ORAL
  Filled 2021-03-24 (×2): qty 1

## 2021-03-24 NOTE — Progress Notes (Signed)
Patient Demographics:    Denise Mendoza, is a 87 y.o. female, DOB - 1932-02-06, OZH:[id number]  Admit date - 03/22/2021   Admitting Physician Ejiroghene Wendall Stade, MD  Outpatient Primary MD for the patient is Ignatius Specking, MD  LOS - 2   Chief Complaint  Patient presents with   Altered Mental Status        Subjective:    [name] today has no fevers, no emesis,  No chest pain,   Lt arm swelling noted -More coherent today, able to answer questions -Requesting Xanax for anxiety and sleep  Assessment  & Plan :    Principal Problem:   Acute metabolic encephalopathy Active Problems:   Essential hypertension   Hyperlipidemia   Pulmonary emphysema (HCC)   Left wrist fracture, closed, initial encounter   Left wrist fracture  Brief Summary:- 87 y.o. female with medical history significant for hypertension, peripheral vascular disease admitted on 03/22/2021 with left wrist fracture, electrolyte abnormalities and acute metabolic encephalopathy superimposed on underlying dementia  A/p 1) hypokalemia/hypomagnesemia//hypoglycemia-- Potassium remains low will replace -Magnesium was 1.4,  -replaced -No further hypoglycemia, D5 solution discontinued  2) acute metabolic encephalopathy superimposed on dementia- -Overall much improved mentation with hydration and IV Rocephin UA may be suspicious for UTI Vs contaminated- -Continue IV Rocephin pending urine culture  3) left wrist fracture status post   fall--EDP reduced fracture with imaging suggesting better alignment orthopedic consult appreciated with recommendations as follows:-NWB LUE, keep splint clean, dry and intact.  Elevate that hand as much as possible to aid in swelling of the hand.  -Continue as needed pain meds  4) thoracic aortic aneurysm---  ascending thoracic aortic system with 5.5 cm -Admitting physician discussed with CTS  surgeon- Dr. Dorris Fetch, patient should follow-up with cardiothoracic surgery at some point, but this does Not need to be done urgently  - 5)COPD/emphysema --- no acute exacerbation at this time, continue bronchodilators  6)HTN--change metoprolol to Toprol-XL 50 mg daily for better BP and heart rate control  7)Socia/Ethics--- admitting provider confirmed DNR status with patient's son Fredrik Cove over the phone  8) anemia/thrombocytopenia--- no bleeding concerns at this time, monitor closely and transfuse as clinically indicated  9) anxiety disorder and insomnia--- Xanax as needed  Disposition/Need for in-Hospital Stay- patient unable to be discharged at this time due to metabolic encephalopathy possible UTI requiring IV antibiotics, dehydration with hypoglycemia and electrolyte abnormalities at this requiring IV fluids -Possible discharge in 1 to 2 days after resolution of  electrolyte derangement and dehydration, pending urine culture findings  Status is: Inpatient  Remains inpatient appropriate because: Please see disposition as above  Disposition: The patient is from: SNF              Anticipated d/c is to: SNF              Anticipated d/c date is: 2 days              Patient currently is not medically stable to d/c. Barriers: Not Clinically Stable-   Code Status :  -  Code Status: DNR   Family Communication:    Son Fredrik Cove  is Product manager) Consults  :  ortho  DVT Prophylaxis  :   - SCDs  SCDs Start: 03/22/21 2208  Lab Results  Component Value Date   PLT 92 (L) 03/24/2021    Inpatient Medications  Scheduled Meds:  ALPRAZolam  0.25 mg Oral Once   ALPRAZolam  0.5 mg Oral QHS   [START ON 03/25/2021] aspirin EC  81 mg Oral Q breakfast   metoprolol succinate  50 mg Oral Daily   simvastatin  20 mg Oral q1800   Continuous Infusions:  cefTRIAXone (ROCEPHIN)  IV 1 g (03/23/21 1716)   PRN Meds:.acetaminophen **OR** acetaminophen, Ipratropium-Albuterol, morphine injection, ondansetron  **OR** ondansetron (ZOFRAN) IV, polyethylene glycol    Anti-infectives (From admission, onward)    Start     Dose/Rate Route Frequency Ordered Stop   03/23/21 1715  cefTRIAXone (ROCEPHIN) 1 g in sodium chloride 0.9 % 100 mL IVPB        1 g 200 mL/hr over 30 Minutes Intravenous Every 24 hours 03/23/21 1622           Objective:   Vitals:   03/23/21 1101 03/23/21 1425 03/23/21 1823 03/24/21 0458  BP: (!) 137/99 (!) 162/54 (!) 168/72 (!) 153/67  Pulse: 95 (!) 105 92 (!) 101  Resp: 20 20 18 18   Temp: 98.7 F (37.1 C) 97.8 F (36.6 C) 98.4 F (36.9 C) 98.4 F (36.9 C)  TempSrc:   Oral Oral  SpO2: 92% 93% 93% 96%    Wt Readings from Last 3 Encounters:  01/17/21 46.2 kg  03/13/20 48.1 kg  01/01/20 48.8 kg     Intake/Output Summary (Last 24 hours) at 03/24/2021 1332 Last data filed at 03/24/2021 1300 Gross per 24 hour  Intake 1605.27 ml  Output 100 ml  Net 1505.27 ml    Physical Exam  Gen:- Awake Alert,  in no apparent distress  HEENT:- [place].AT, No sclera icterus Neck-Supple Neck,No JVD,.  Lungs-  CTAB , fair symmetrical air movement CV- S1, S2 normal, regular  Abd-  +ve B.Sounds, Abd Soft, No tenderness, no CVA area tenderness Extremity/Skin:- No  edema, pedal pulses present  Psych-affect is appropriate, alert and oriented x3 today neuro-generalized weakness, no new focal deficits, no tremors MSK-left wrist/upper extremity in splint, swollen fingers with cap refill and no evidence of compartment syndrome clinically   Data Review:   Micro Results Recent Results (from the past 240 hour(s))  Resp Panel by RT-PCR (Flu A&B, Covid) Nasopharyngeal Swab     Status: None   Collection Time: 03/22/21  5:32 PM   Specimen: Nasopharyngeal Swab; Nasopharyngeal(NP) swabs in vial transport medium  Result Value Ref Range Status   SARS Coronavirus 2 by RT PCR NEGATIVE NEGATIVE Final    Comment: (NOTE) SARS-CoV-2 target nucleic acids are NOT DETECTED.  The SARS-CoV-2 RNA is  generally detectable in upper respiratory specimens during the acute phase of infection. The lowest concentration of SARS-CoV-2 viral copies this assay can detect is 138 copies/mL. A negative result does not preclude SARS-Cov-2 infection and should not be used as the sole basis for treatment or other patient management decisions. A negative result may occur with  improper specimen collection/handling, submission of specimen other than nasopharyngeal swab, presence of viral mutation(s) within the areas targeted by this assay, and inadequate number of viral copies(<138 copies/mL). A negative result must be combined with clinical observations, patient history, and epidemiological information. The expected result is Negative.  Fact Sheet for Patients:  03/24/21  Fact Sheet for Healthcare Providers:  BloggerCourse.com  This test is no t yet approved or cleared by the [place] FDA  and  has been authorized for detection and/or diagnosis of SARS-CoV-2 by FDA under an Emergency Use Authorization (EUA). This EUA will remain  in effect (meaning this test can be used) for the duration of the COVID-19 declaration under Section 564(b)(1) of the Act, 21 U.S.C.section 360bbb-3(b)(1), unless the authorization is terminated  or revoked sooner.       Influenza A by PCR NEGATIVE NEGATIVE Final   Influenza B by PCR NEGATIVE NEGATIVE Final    Comment: (NOTE) The Xpert Xpress SARS-CoV-2/FLU/RSV plus assay is intended as an aid in the diagnosis of influenza from Nasopharyngeal swab specimens and should not be used as a sole basis for treatment. Nasal washings and aspirates are unacceptable for Xpert Xpress SARS-CoV-2/FLU/RSV testing.  Fact Sheet for Patients: BloggerCourse.com  Fact Sheet for Healthcare Providers: [employer]ious[employer]ker.it  This test is not yet approved or cleared by the [place] FDA and has been authorized for detection and/or diagnosis of SARS-CoV-2 by FDA under an Emergency Use Authorization (EUA). This EUA will remain in effect (meaning this test can be used) for the duration of the COVID-19 declaration under Section 564(b)(1) of the Act, 21 U.S.C. section 360bbb-3(b)(1), unless the authorization is terminated or revoked.  Performed at [facility], [address] [place], [place] [address]     Radiology Reports DG Chest 2 View  Result Date: 03/22/2021 CLINICAL DATA:  Shortness of breath.  Fall. EXAM: CHEST - 2 VIEW COMPARISON:  January 17, 2021. FINDINGS: Stable cardiomegaly with aneurysmal dilatation of the thoracic aorta. Both lungs are clear. The visualized skeletal structures are unremarkable. IMPRESSION: Stable cardiomegaly with aneurysmal dilatation of thoracic aorta. CT scan is recommended for further evaluation. Electronically Signed   By: Lupita Raider M.D.   On: 03/22/2021 14:45   DG Pelvis 1-2 Views  Result Date: 03/22/2021 CLINICAL DATA:  Left hip pain after fall. EXAM: PELVIS - 1-2 VIEW COMPARISON:  January 03, 2020. FINDINGS: There is no evidence of acute pelvic fracture or diastasis. Status post surgical internal fixation of old proximal right femoral fracture. IMPRESSION: No acute abnormality seen. Electronically Signed   By: Lupita Raider M.D.   On: 03/22/2021 14:49   DG Forearm Left  Result Date: 03/22/2021 CLINICAL DATA:  Left forearm pain after fall. EXAM: LEFT FOREARM - 2 VIEW COMPARISON:  None. FINDINGS: Severely displaced and probably comminuted distal left radial fracture is noted. The ulna appears grossly unremarkable. IMPRESSION: Severely displaced and probably comminuted distal left radial fracture. Electronically Signed   By: Lupita Raider M.D.   On: 03/22/2021 14:47   DG Wrist 2 Views Left  Result Date: 03/23/2021 CLINICAL DATA:  87 year old female left wrist fracture post reduction. EXAM: LEFT WRIST - 2 VIEW  COMPARISON:  03/22/2021. FINDINGS: Two views with cast material in place. Improved alignment at the comminuted distal left radius fracture. Decreased impaction and dorsal angulation since the splint images yesterday. Probable ulnar styloid fracture. Underlying osteopenia. No new osseous abnormality identified. IMPRESSION: Improved and satisfactory alignment at the comminuted distal left radius fracture. Electronically Signed   By: Odessa Fleming M.D.   On: 03/23/2021 09:33   DG Wrist 2 Views Left  Result Date: 03/22/2021 CLINICAL DATA:  Left wrist fracture EXAM: LEFT WRIST - 2 VIEW COMPARISON:  Left forearm radiograph dated 03/22/2021 FINDINGS: Comminuted distal radial fracture with intra-articular extension. Mild apex volar angulation. Soft tissue swelling. Overlying cast obscures fine osseous detail. IMPRESSION: Comminuted distal radial fracture, as above.  Overlying cast. Electronically Signed  By: Charline Bills M.D.   On: 03/22/2021 22:26   CT Head Wo Contrast  Result Date: 03/22/2021 CLINICAL DATA:  An 87 year old female presents with altered mental status and potential fall. EXAM: CT HEAD WITHOUT CONTRAST TECHNIQUE: Contiguous axial images were obtained from the base of the skull through the vertex without intravenous contrast. COMPARISON:  December 31, 2019. FINDINGS: Brain: No evidence of acute infarction, hemorrhage, hydrocephalus, extra-axial collection or mass lesion/mass effect. Signs of chronic microvascular ischemic change with mild asymmetry not changed since July of 2021. Age related atrophy as before. Vascular: No hyperdense vessel or unexpected calcification. Skull: Normal. Negative for fracture or focal lesion. Sinuses/Orbits: Visualized paranasal sinuses and orbits are unremarkable. Other: None. IMPRESSION: No acute intracranial abnormality. Signs of chronic microvascular ischemic change with mild asymmetry not changed since July of 2021. Electronically Signed   By: Donzetta Kohut M.D.   On:  03/22/2021 14:25   CT Chest W Contrast  Result Date: 03/22/2021 CLINICAL DATA:  Chest pain. EXAM: CT CHEST WITH CONTRAST TECHNIQUE: Multidetector CT imaging of the chest was performed during intravenous contrast administration. CONTRAST:  33mL OMNIPAQUE IOHEXOL 350 MG/ML SOLN COMPARISON:  Radiograph of same day. FINDINGS: Cardiovascular: 5.5 cm ascending thoracic aortic aneurysm is noted. No dissection is noted. Atherosclerosis of thoracic aorta is noted. 4.7 cm aneurysm is seen involving the distal portion of the transverse aortic arch. Mild cardiomegaly is noted. No pericardial effusion is noted. Mediastinum/Nodes: No enlarged mediastinal, hilar, or axillary lymph nodes. Thyroid gland, trachea, and esophagus demonstrate no significant findings. Lungs/Pleura: No pneumothorax or pleural effusion is noted. Mild right apical scarring is noted. Left lung is unremarkable. Upper Abdomen: No acute abnormality. Musculoskeletal: Multiple old upper thoracic vertebral body compression fractures are noted. No acute abnormality is noted. IMPRESSION: 5.5 cm ascending thoracic aortic aneurysm. Cardiothoracic surgery consultation recommended due to increased risk of rupture for arch aneurysm ? 5.5 cm. This recommendation follows 2010 ACCF/AHA/AATS/ACR/ASA/SCA/SCAI/SIR/STS/SVM Guidelines for the Diagnosis and Management of Patients With Thoracic Aortic Disease. Circulation. 2010; 121: F790-W409. Aortic aneurysm NOS (ICD10-I71.9). 4.7 cm aneurysm is seen involving distal portion of transverse aortic arch. Aortic Atherosclerosis (ICD10-I70.0). Electronically Signed   By: Lupita Raider M.D.   On: 03/22/2021 19:39     CBC Recent Labs  Lab 03/22/21 1310 03/23/21 0338 03/24/21 0556  WBC 5.8 6.2 7.5  HGB 11.6* 10.3* 11.0*  HCT 36.7 33.8* 35.1*  PLT 117* 100* 92*  MCV 96.1 99.7 96.7  MCH 30.4 30.4 30.3  MCHC 31.6 30.5 31.3  RDW 15.9* 15.9* 15.8*  LYMPHSABS 0.8  --   --   MONOABS 0.4  --   --   EOSABS 0.0  --   --    BASOSABS 0.0  --   --     Chemistries  Recent Labs  Lab 03/22/21 1310 03/23/21 0338 03/24/21 0556  NA 138 140 138  K 2.6* 3.8 3.1*  CL 102 109 104  CO2 25 22 27   GLUCOSE 81 123* 111*  BUN 14 8 7*  CREATININE 0.61 0.38* 0.43*  CALCIUM 8.7* 7.6* 8.0*  MG 1.4*  --   --   AST 24  --   --   ALT 12  --   --   ALKPHOS 58  --   --   BILITOT 1.7*  --   --    ------------------------------------------------------------------------------------------------------------------ No results for input(s): CHOL, HDL, LDLCALC, TRIG, CHOLHDL, LDLDIRECT in the last 72 hours.  No results found for: HGBA1C ------------------------------------------------------------------------------------------------------------------ No  results for input(s): TSH, T4TOTAL, T3FREE, THYROIDAB in the last 72 hours.  Invalid input(s): FREET3 ------------------------------------------------------------------------------------------------------------------ No results for input(s): VITAMINB12, FOLATE, FERRITIN, TIBC, IRON, RETICCTPCT in the last 72 hours.  Coagulation profile No results for input(s): INR, PROTIME in the last 168 hours.  No results for input(s): DDIMER in the last 72 hours.  Cardiac Enzymes No results for input(s): CKMB, TROPONINI, MYOGLOBIN in the last 168 hours.  Invalid input(s): CK ------------------------------------------------------------------------------------------------------------------    Component Value Date/Time   BNP 756.0 (H) 01/04/2020 0156     Shon Hale M.D on 03/24/2021 at 1:32 PM  Go to [email or web address] - for contact info  [facility] - Office  [phone]

## 2021-03-25 DIAGNOSIS — E876 Hypokalemia: Secondary | ICD-10-CM

## 2021-03-25 DIAGNOSIS — I10 Essential (primary) hypertension: Secondary | ICD-10-CM | POA: Diagnosis not present

## 2021-03-25 DIAGNOSIS — G9341 Metabolic encephalopathy: Secondary | ICD-10-CM | POA: Diagnosis not present

## 2021-03-25 DIAGNOSIS — J439 Emphysema, unspecified: Secondary | ICD-10-CM

## 2021-03-25 DIAGNOSIS — R5381 Other malaise: Secondary | ICD-10-CM

## 2021-03-25 DIAGNOSIS — S62102S Fracture of unspecified carpal bone, left wrist, sequela: Secondary | ICD-10-CM | POA: Diagnosis not present

## 2021-03-25 DIAGNOSIS — N390 Urinary tract infection, site not specified: Secondary | ICD-10-CM

## 2021-03-25 LAB — CBC
HCT: 34.6 % — ABNORMAL LOW (ref 36.0–46.0)
Hemoglobin: 10.8 g/dL — ABNORMAL LOW (ref 12.0–15.0)
MCH: 30.7 pg (ref 26.0–34.0)
MCHC: 31.2 g/dL (ref 30.0–36.0)
MCV: 98.3 fL (ref 80.0–100.0)
Platelets: 83 10*3/uL — ABNORMAL LOW (ref 150–400)
RBC: 3.52 MIL/uL — ABNORMAL LOW (ref 3.87–5.11)
RDW: 15.7 % — ABNORMAL HIGH (ref 11.5–15.5)
WBC: 5.5 10*3/uL (ref 4.0–10.5)
nRBC: 0 % (ref 0.0–0.2)

## 2021-03-25 MED ORDER — ALPRAZOLAM 0.5 MG PO TABS
0.5000 mg | ORAL_TABLET | Freq: Two times a day (BID) | ORAL | Status: DC | PRN
  Administered 2021-03-25 – 2021-03-26 (×2): 0.5 mg via ORAL
  Filled 2021-03-25 (×2): qty 1

## 2021-03-25 MED ORDER — POTASSIUM CHLORIDE CRYS ER 20 MEQ PO TBCR
20.0000 meq | EXTENDED_RELEASE_TABLET | Freq: Every day | ORAL | Status: DC
  Administered 2021-03-25 – 2021-03-26 (×2): 20 meq via ORAL
  Filled 2021-03-25 (×2): qty 1

## 2021-03-25 MED ORDER — TRAMADOL HCL 50 MG PO TABS
50.0000 mg | ORAL_TABLET | Freq: Three times a day (TID) | ORAL | Status: DC | PRN
  Administered 2021-03-25 – 2021-03-26 (×3): 50 mg via ORAL
  Filled 2021-03-25 (×3): qty 1

## 2021-03-25 MED ORDER — MORPHINE SULFATE (PF) 2 MG/ML IV SOLN
2.0000 mg | Freq: Four times a day (QID) | INTRAVENOUS | Status: DC | PRN
  Administered 2021-03-25 – 2021-03-26 (×2): 2 mg via INTRAVENOUS
  Filled 2021-03-25 (×2): qty 1

## 2021-03-25 NOTE — Progress Notes (Signed)
Patient Demographics:    Denise Mendoza, is a 87 y.o. female, DOB - 08-20-1931, FMB:[id number]  Admit date - 03/22/2021   Admitting Physician Ejiroghene Wendall Stade, MD  Outpatient Primary MD for the patient is Ignatius Specking, MD  LOS - 3   Chief Complaint  Patient presents with   Altered Mental Status        Subjective:    Denise Mendoza today has no chest pain, shortness of breath, nausea or vomiting.  Reports pain in her left upper extremity, shoulder and back.  Chronically ill in appearance, weak and deconditioned.    Assessment  & Plan :    Principal Problem:   Acute metabolic encephalopathy Active Problems:   Essential hypertension   Hyperlipidemia   Pulmonary emphysema (HCC)   Left wrist fracture, closed, initial encounter   Left wrist fracture  Brief Summary:- 87 y.o. female with medical history significant for hypertension, peripheral vascular disease admitted on 03/22/2021 with left wrist fracture, electrolyte abnormalities and acute metabolic encephalopathy superimposed on underlying dementia  A/p 1) hypokalemia/hypomagnesemia//hypoglycemia-- -potassium has also been repleted; currently 3.1.  Starting daily maintenance supplementation. -Magnesium repleted; will continue to follow trend intermittently. -No further hypoglycemia appreciated; patient advised to maintain adequate hydration and nutrition.  2) acute metabolic encephalopathy superimposed on dementia- -Overall much improved mentation with hydration and IV Rocephin -UA may be suspicious for UTI Vs contaminated- -Continue IV Rocephin pending urine culture. -Advised to maintain adequate hydration.  3) left wrist fracture status post   fall--EDP reduced fracture with imaging suggesting better alignment orthopedic consult appreciated with recommendations as follows:-NWB LUE, keep splint clean, dry and intact.  Elevate that  hand as much as possible to aid in swelling of the hand.  -Continue as needed pain meds. -Tramadol will be added for moderate pain and breakthrough.  4) thoracic aortic aneurysm---  ascending thoracic aortic system with 5.5 cm -Admitting physician discussed with CTS surgeon- Dr. Dorris Fetch, patient should follow-up with cardiothoracic surgery at some point, but this does Not need to be done urgently.  5)COPD/emphysema --- no acute exacerbation at this time, continue bronchodilators. -No wheezing appreciated on exam.  6)HTN--change metoprolol to Toprol-XL 50 mg daily for better BP and heart rate control. -Low-sodium diet discussed with patient.  7)Socia/Ethics--- admitting provider confirmed DNR status with patient's son Fredrik Cove over the phone.  8) anemia/thrombocytopenia--- no bleeding concerns at this time, monitor closely and transfuse as clinically indicated. -Repeat CBC in a.m.  9) anxiety disorder and insomnia---  -Continue adjusted dose of Xanax as needed  Disposition/Need for in-Hospital Stay- patient unable to be discharged at this time due to metabolic encephalopathy possible UTI requiring IV antibiotics, dehydration with hypoglycemia and electrolyte abnormalities at this requiring IV fluids -Possible discharge in 1 to 2 days after resolution of  electrolyte derangement and dehydration, pending urine culture findings  Status is: Inpatient  Remains inpatient appropriate because: Please see disposition as above  Disposition: The patient is from: SNF              Anticipated d/c is to: SNF              Anticipated d/c date is: 2 days  Patient currently is not medically stable to d/c. Barriers: Not Clinically Stable-   Code Status :  -  Code Status: DNR   Family Communication:    Son Fredrik Cove  is HCPOA) Consults  :  ortho  DVT Prophylaxis  :   - SCDs  SCDs Start: 03/22/21 2208  Lab Results  Component Value Date   PLT 83 (L) 03/25/2021    Inpatient  Medications  Scheduled Meds:  aspirin EC  81 mg Oral Q breakfast   metoprolol succinate  50 mg Oral Daily   simvastatin  20 mg Oral q1800   Continuous Infusions:  cefTRIAXone (ROCEPHIN)  IV 1 g (03/25/21 1612)   PRN Meds:.acetaminophen **OR** acetaminophen, ALPRAZolam, Ipratropium-Albuterol, morphine injection, ondansetron **OR** ondansetron (ZOFRAN) IV, polyethylene glycol, traMADol    Anti-infectives (From admission, onward)    Start     Dose/Rate Route Frequency Ordered Stop   03/23/21 1715  cefTRIAXone (ROCEPHIN) 1 g in sodium chloride 0.9 % 100 mL IVPB        1 g 200 mL/hr over 30 Minutes Intravenous Every 24 hours 03/23/21 1622           Objective:   Vitals:   03/24/21 1333 03/24/21 2102 03/25/21 0547 03/25/21 1347  BP: 121/66 (!) 140/54 122/65 105/70  Pulse: 79 69 69 84  Resp: 18 18 18    Temp: 98.2 F (36.8 C) 99.1 F (37.3 C) 98.2 F (36.8 C) 98 F (36.7 C)  TempSrc: Oral Oral Oral Oral  SpO2: 95% 98% 93% 96%    Wt Readings from Last 3 Encounters:  01/17/21 46.2 kg  03/13/20 48.1 kg  01/01/20 48.8 kg     Intake/Output Summary (Last 24 hours) at 03/25/2021 1653 Last data filed at 03/25/2021 1612 Gross per 24 hour  Intake 1580 ml  Output --  Net 1580 ml    Physical Exam General exam: Alert, awake, oriented x 3 today; in no major distress following commands appropriately and expressing some pain in her shoulder, lower back and left upper extremity. Respiratory system: No using accessory muscles; good saturation on room air.  Normal respiratory effort Cardiovascular system: S1 and S2, no rubs, no gallops, no JVD. Gastrointestinal system: Abdomen is nondistended, soft and nontender. No organomegaly or masses felt. Normal bowel sounds heard. Central nervous system: No focal deficits. Extremities: No cyanosis; left upper extremity with dressing/splint protecting her wrist in place.  Swelling hand and fingers appreciated on exam.  No clubbing, good  capillary refill and no signs or evidence of compartment syndrome appreciated on exam. Skin: No petechiae. Psychiatry: Judgement and insight appear normal. Mood & affect appropriate.     Data Review:   Micro Results Recent Results (from the past 240 hour(s))  Resp Panel by RT-PCR (Flu A&B, Covid) Nasopharyngeal Swab     Status: None   Collection Time: 03/22/21  5:32 PM   Specimen: Nasopharyngeal Swab; Nasopharyngeal(NP) swabs in vial transport medium  Result Value Ref Range Status   SARS Coronavirus 2 by RT PCR NEGATIVE NEGATIVE Final    Comment: (NOTE) SARS-CoV-2 target nucleic acids are NOT DETECTED.  The SARS-CoV-2 RNA is generally detectable in upper respiratory specimens during the acute phase of infection. The lowest concentration of SARS-CoV-2 viral copies this assay can detect is 138 copies/mL. A negative result does not preclude SARS-Cov-2 infection and should not be used as the sole basis for treatment or other patient management decisions. A negative result may occur with  improper specimen collection/handling, submission of specimen  other than nasopharyngeal swab, presence of viral mutation(s) within the areas targeted by this assay, and inadequate number of viral copies(<138 copies/mL). A negative result must be combined with clinical observations, patient history, and epidemiological information. The expected result is Negative.  Fact Sheet for Patients:  BloggerCourse.com  Fact Sheet for Healthcare Providers:  SeriousBroker.it  This test is no t yet approved or cleared by the [place] FDA and  has been authorized for detection and/or diagnosis of SARS-CoV-2 by FDA under an Emergency Use Authorization (EUA). This EUA will remain  in effect (meaning this test can be used) for the duration of the COVID-19 declaration under Section 564(b)(1) of the Act, 21 U.S.C.section 360bbb-3(b)(1), unless the authorization  is terminated  or revoked sooner.       Influenza A by PCR NEGATIVE NEGATIVE Final   Influenza B by PCR NEGATIVE NEGATIVE Final    Comment: (NOTE) The Xpert Xpress SARS-CoV-2/FLU/RSV plus assay is intended as an aid in the diagnosis of influenza from Nasopharyngeal swab specimens and should not be used as a sole basis for treatment. Nasal washings and aspirates are unacceptable for Xpert Xpress SARS-CoV-2/FLU/RSV testing.  Fact Sheet for Patients: BloggerCourse.com  Fact Sheet for Healthcare Providers: SeriousBroker.it  This test is not yet approved or cleared by the [place] FDA and has been authorized for detection and/or diagnosis of SARS-CoV-2 by FDA under an Emergency Use Authorization (EUA). This EUA will remain in effect (meaning this test can be used) for the duration of the COVID-19 declaration under Section 564(b)(1) of the Act, 21 U.S.C. section 360bbb-3(b)(1), unless the authorization is terminated or revoked.  Performed at [facility], [address], [place], [place] [address]   Urine Culture     Status: Abnormal (Preliminary result)   Collection Time: 03/23/21  5:20 PM   Specimen: Urine, Clean Catch  Result Value Ref Range Status   Specimen Description   Final    URINE, CLEAN CATCH Performed at [facility], [address], [place], [place] [address]    Special Requests   Final    NONE Performed at [facility], [address] [place], [place] [address]    Culture (A)  Final    80,000 COLONIES/mL ESCHERICHIA COLI SUSCEPTIBILITIES TO FOLLOW Performed at [facility], [address], [place], [place] [address]    Report Status PENDING  Incomplete   Radiology Reports DG Chest 2 View  Result Date: 03/22/2021 CLINICAL DATA:  Shortness of breath.  Fall. EXAM: CHEST - 2 VIEW COMPARISON:  January 17, 2021. FINDINGS: Stable cardiomegaly with aneurysmal dilatation of the thoracic aorta. Both lungs are  clear. The visualized skeletal structures are unremarkable. IMPRESSION: Stable cardiomegaly with aneurysmal dilatation of thoracic aorta. CT scan is recommended for further evaluation. Electronically Signed   By: Lupita Raider M.D.   On: 03/22/2021 14:45   DG Pelvis 1-2 Views  Result Date: 03/22/2021 CLINICAL DATA:  Left hip pain after fall. EXAM: PELVIS - 1-2 VIEW COMPARISON:  January 03, 2020. FINDINGS: There is no evidence of acute pelvic fracture or diastasis. Status post surgical internal fixation of old proximal right femoral fracture. IMPRESSION: No acute abnormality seen. Electronically Signed   By: Lupita Raider M.D.   On: 03/22/2021 14:49   DG Forearm Left  Result Date: 03/22/2021 CLINICAL DATA:  Left forearm pain after fall. EXAM: LEFT FOREARM - 2 VIEW COMPARISON:  None. FINDINGS: Severely displaced and probably comminuted distal left radial fracture is noted. The ulna  appears grossly unremarkable. IMPRESSION: Severely displaced and probably comminuted distal left radial fracture. Electronically Signed   By: Lupita Raider M.D.   On: 03/22/2021 14:47   DG Wrist 2 Views Left  Result Date: 03/23/2021 CLINICAL DATA:  87 year old female left wrist fracture post reduction. EXAM: LEFT WRIST - 2 VIEW COMPARISON:  03/22/2021. FINDINGS: Two views with cast material in place. Improved alignment at the comminuted distal left radius fracture. Decreased impaction and dorsal angulation since the splint images yesterday. Probable ulnar styloid fracture. Underlying osteopenia. No new osseous abnormality identified. IMPRESSION: Improved and satisfactory alignment at the comminuted distal left radius fracture. Electronically Signed   By: Odessa Fleming M.D.   On: 03/23/2021 09:33   DG Wrist 2 Views Left  Result Date: 03/22/2021 CLINICAL DATA:  Left wrist fracture EXAM: LEFT WRIST - 2 VIEW COMPARISON:  Left forearm radiograph dated 03/22/2021 FINDINGS: Comminuted distal radial fracture with intra-articular  extension. Mild apex volar angulation. Soft tissue swelling. Overlying cast obscures fine osseous detail. IMPRESSION: Comminuted distal radial fracture, as above.  Overlying cast. Electronically Signed   By: Charline Bills M.D.   On: 03/22/2021 22:26   CT Head Wo Contrast  Result Date: 03/22/2021 CLINICAL DATA:  An 87 year old female presents with altered mental status and potential fall. EXAM: CT HEAD WITHOUT CONTRAST TECHNIQUE: Contiguous axial images were obtained from the base of the skull through the vertex without intravenous contrast. COMPARISON:  December 31, 2019. FINDINGS: Brain: No evidence of acute infarction, hemorrhage, hydrocephalus, extra-axial collection or mass lesion/mass effect. Signs of chronic microvascular ischemic change with mild asymmetry not changed since July of 2021. Age related atrophy as before. Vascular: No hyperdense vessel or unexpected calcification. Skull: Normal. Negative for fracture or focal lesion. Sinuses/Orbits: Visualized paranasal sinuses and orbits are unremarkable. Other: None. IMPRESSION: No acute intracranial abnormality. Signs of chronic microvascular ischemic change with mild asymmetry not changed since July of 2021. Electronically Signed   By: Donzetta Kohut M.D.   On: 03/22/2021 14:25   CT Chest W Contrast  Result Date: 03/22/2021 CLINICAL DATA:  Chest pain. EXAM: CT CHEST WITH CONTRAST TECHNIQUE: Multidetector CT imaging of the chest was performed during intravenous contrast administration. CONTRAST:  17mL OMNIPAQUE IOHEXOL 350 MG/ML SOLN COMPARISON:  Radiograph of same day. FINDINGS: Cardiovascular: 5.5 cm ascending thoracic aortic aneurysm is noted. No dissection is noted. Atherosclerosis of thoracic aorta is noted. 4.7 cm aneurysm is seen involving the distal portion of the transverse aortic arch. Mild cardiomegaly is noted. No pericardial effusion is noted. Mediastinum/Nodes: No enlarged mediastinal, hilar, or axillary lymph nodes. Thyroid gland,  trachea, and esophagus demonstrate no significant findings. Lungs/Pleura: No pneumothorax or pleural effusion is noted. Mild right apical scarring is noted. Left lung is unremarkable. Upper Abdomen: No acute abnormality. Musculoskeletal: Multiple old upper thoracic vertebral body compression fractures are noted. No acute abnormality is noted. IMPRESSION: 5.5 cm ascending thoracic aortic aneurysm. Cardiothoracic surgery consultation recommended due to increased risk of rupture for arch aneurysm ? 5.5 cm. This recommendation follows 2010 ACCF/AHA/AATS/ACR/ASA/SCA/SCAI/SIR/STS/SVM Guidelines for the Diagnosis and Management of Patients With Thoracic Aortic Disease. Circulation. 2010; 121: J856-D149. Aortic aneurysm NOS (ICD10-I71.9). 4.7 cm aneurysm is seen involving distal portion of transverse aortic arch. Aortic Atherosclerosis (ICD10-I70.0). Electronically Signed   By: Lupita Raider M.D.   On: 03/22/2021 19:39     CBC Recent Labs  Lab 03/22/21 1310 03/23/21 0338 03/24/21 0556 03/25/21 0510  WBC 5.8 6.2 7.5 5.5  HGB 11.6* 10.3* 11.0* 10.8*  HCT 36.7 33.8* 35.1* 34.6*  PLT 117* 100* 92* 83*  MCV 96.1 99.7 96.7 98.3  MCH 30.4 30.4 30.3 30.7  MCHC 31.6 30.5 31.3 31.2  RDW 15.9* 15.9* 15.8* 15.7*  LYMPHSABS 0.8  --   --   --   MONOABS 0.4  --   --   --   EOSABS 0.0  --   --   --   BASOSABS 0.0  --   --   --     Chemistries  Recent Labs  Lab 03/22/21 1310 03/23/21 0338 03/24/21 0556  NA 138 140 138  K 2.6* 3.8 3.1*  CL 102 109 104  CO2 25 22 27   GLUCOSE 81 123* 111*  BUN 14 8 7*  CREATININE 0.61 0.38* 0.43*  CALCIUM 8.7* 7.6* 8.0*  MG 1.4*  --   --   AST 24  --   --   ALT 12  --   --   ALKPHOS 58  --   --   BILITOT 1.7*  --   --    ------------------------------------------------------------------------------------------------------------------    Component Value Date/Time   BNP 756.0 (H) 01/04/2020 03/05/2020     9432 M.D on 03/25/2021 at 4:53 PM  Go to  [email or web address] - for contact info  [facility] - Office  [phone]

## 2021-03-26 DIAGNOSIS — E785 Hyperlipidemia, unspecified: Secondary | ICD-10-CM

## 2021-03-26 DIAGNOSIS — W19XXXA Unspecified fall, initial encounter: Secondary | ICD-10-CM

## 2021-03-26 DIAGNOSIS — G9341 Metabolic encephalopathy: Secondary | ICD-10-CM | POA: Diagnosis not present

## 2021-03-26 DIAGNOSIS — I10 Essential (primary) hypertension: Secondary | ICD-10-CM | POA: Diagnosis not present

## 2021-03-26 DIAGNOSIS — W19XXXD Unspecified fall, subsequent encounter: Secondary | ICD-10-CM | POA: Diagnosis not present

## 2021-03-26 DIAGNOSIS — S62102S Fracture of unspecified carpal bone, left wrist, sequela: Secondary | ICD-10-CM | POA: Diagnosis not present

## 2021-03-26 LAB — URINE CULTURE: Culture: 80000 — AB

## 2021-03-26 LAB — BASIC METABOLIC PANEL
Anion gap: 5 (ref 5–15)
BUN: 18 mg/dL (ref 8–23)
CO2: 25 mmol/L (ref 22–32)
Calcium: 8 mg/dL — ABNORMAL LOW (ref 8.9–10.3)
Chloride: 108 mmol/L (ref 98–111)
Creatinine, Ser: 0.5 mg/dL (ref 0.44–1.00)
GFR, Estimated: >60 mL/min (ref >60)
Glucose, Bld: 98 mg/dL (ref 70–99)
Potassium: 3.3 mmol/L — ABNORMAL LOW (ref 3.5–5.1)
Sodium: 138 mmol/L (ref 135–145)

## 2021-03-26 LAB — CBC
HCT: 34.8 % — ABNORMAL LOW (ref 36.0–46.0)
Hemoglobin: 10.5 g/dL — ABNORMAL LOW (ref 12.0–15.0)
MCH: 29.9 pg (ref 26.0–34.0)
MCHC: 30.2 g/dL (ref 30.0–36.0)
MCV: 99.1 fL (ref 80.0–100.0)
Platelets: 92 10*3/uL — ABNORMAL LOW (ref 150–400)
RBC: 3.51 MIL/uL — ABNORMAL LOW (ref 3.87–5.11)
RDW: 15.3 % (ref 11.5–15.5)
WBC: 5.2 10*3/uL (ref 4.0–10.5)
nRBC: 0 % (ref 0.0–0.2)

## 2021-03-26 LAB — MAGNESIUM: Magnesium: 1.6 mg/dL — ABNORMAL LOW (ref 1.7–2.4)

## 2021-03-26 MED ORDER — TRAMADOL HCL 50 MG PO TABS: 50.0000 mg | ORAL_TABLET | Freq: Three times a day (TID) | ORAL | 0 refills | Status: AC | PRN

## 2021-03-26 MED ORDER — METOPROLOL SUCCINATE ER 50 MG PO TB24: 50.0000 mg | ORAL_TABLET | Freq: Every day | ORAL | 2 refills | Status: AC

## 2021-03-26 MED ORDER — CEFDINIR 300 MG PO CAPS: 300.0000 mg | ORAL_CAPSULE | Freq: Two times a day (BID) | ORAL | 0 refills | Status: AC

## 2021-03-26 MED ORDER — ACETAMINOPHEN 325 MG PO TABS: 650.0000 mg | ORAL_TABLET | Freq: Four times a day (QID) | ORAL | Status: AC | PRN

## 2021-03-26 MED ORDER — ASPIRIN 81 MG PO TBEC: 81.0000 mg | DELAYED_RELEASE_TABLET | Freq: Every day | ORAL | 11 refills | Status: AC

## 2021-03-26 NOTE — Plan of Care (Addendum)
  Problem: Acute Rehab PT Goals(only PT should resolve) Goal: Pt will Roll Supine to Side Outcome: Progressing Flowsheets (Taken 03/26/2021 1256) Pt will Roll Supine to Side: with min assist Goal: Pt Will Go Supine/Side To Sit Outcome: Progressing Flowsheets (Taken 03/26/2021 1256) Pt will go Supine/Side to Sit: with minimal assist Goal: Pt Will Go Sit To Supine/Side Outcome: Progressing Flowsheets (Taken 03/26/2021 1256) Pt will go Sit to Supine/Side: with minimal assist Goal: Patient Will Perform Sitting Balance Outcome: Progressing Flowsheets (Taken 03/26/2021 1256) Patient will perform sitting balance:  with modified independence  Independently Goal: Patient Will Transfer Sit To/From Stand Outcome: Progressing Flowsheets (Taken 03/26/2021 1256) Patient will transfer sit to/from stand: with modified independence Goal: Pt Will Transfer Bed To Chair/Chair To Bed Outcome: Progressing Flowsheets (Taken 03/26/2021 1256) Pt will Transfer Bed to Chair/Chair to Bed: with modified independence Goal: Pt Will Perform Standing Balance Or Pre-Gait Outcome: Progressing Flowsheets (Taken 03/26/2021 1256) Pt will perform standing balance or pre-gait: with Modified Independent Goal: Pt Will Ambulate Outcome: Progressing Flowsheets (Taken 03/26/2021 1256) Pt will Ambulate:  > 125 feet  with supervision  with modified independence  with cane  Phyllis Ginger, SPT   During this treatment session, the therapist was present, participating in and directing the treatment.  2:28 PM, 03/26/21 Ocie Bob, MPT Physical Therapist with [facility] [phone] office [phone] mobile phone

## 2021-03-26 NOTE — Care Management Important Message (Signed)
Important Message  Patient Details  Name: MARDELLE PANDOLFI MRN: [id number] Date of Birth: [birth year]   Medicare Important Message Given:  Yes     Corey Harold 03/26/2021, 4:09 PM

## 2021-03-26 NOTE — Discharge Summary (Signed)
Physician Discharge Summary  Denise Mendoza XLK:[id number] DOB: 04-May-1932 DOA: 03/22/2021  PCP: Denise Specking, MD  Admit date: 03/22/2021 Discharge date: 03/26/2021  Time spent: 35 minutes  Recommendations for Outpatient Follow-up:  Repeat CBC to follow platelets and Hgb count. Repeat BMET and Mg level to follow renal function electrolytes. Reassess BP and adjust antihypertensive regimen as needed.   Discharge Diagnoses:  Principal Problem:   Acute metabolic encephalopathy Active Problems:   Essential hypertension   Hyperlipidemia   Pulmonary emphysema (HCC)   Left wrist fracture, closed, initial encounter   Left wrist fracture   Fall   Discharge Condition: Stable and improved. Patient discharged home with [facility] services and family care. Outpatient follow up with orthopedic service.  Code status: DNR  Diet recommendation: heart healthy diet   History of present illness:  As per H&P written by Dr. Mariea Mendoza on 03/22/21 Denise Mendoza is a 87 y.o. female with medical history significant for hypertension, peripheral vascular disease. Patient was brought to the ED by family.  Member.  Patient lives alone, patient's son reported that patient has been confused and talking out of her head for 3 days.  Per triage notes, initially patient was able to tell place and date and year. At the time of my evaluation, patient is intermittently confused, sometimes able to answer questions appropriately.   Recent hospitalization 01/2021 for acute respiratory failure with hypoxia secondary to COVID-pneumonia, treated with remdesivir and steroids.   I called patient's son, Denise Mendoza who is HCPOA.  At baseline, patient is independent of all ADLs.  Patient ambulates with a cane. He is not aware of any memory problems.  Patient lives alone.  Last checked on patient is able on the 17th.  He does not know when patient fell or how she sustained a fracture to her wrist.   ED Course: Temperature 98.6.   Heart rate 83-108.  Respiratory rate 14-26, blood pressure systolic 1 34-1 58.  O2 sats greater than 92% on room air.  WBC 5.8.  Potassium 2.6.  UA shows rare bacteria only.  UDS unremarkable.   Chest x-ray showed stable cardiomegaly with aneurysmal dilation of the thoracic aorta, CT scan was recommended and was subsequently done with contrast5.5 cm ascending thoracic aortic aneurysm. Left forearm x-ray shows severely displaced and probably comminuted distal left radial fracture.  Pelvic x-ray without acute abnormality. -Potassium supplementation started, hospitalist called to admit , with plans to consult Ortho for comminuted wrist fracture.  Hospital Course:  1) hypokalemia/hypomagnesemia//hypoglycemia-- -potassium has also been repleted; currently 3.6.   -Started daily maintenance supplementation. -Magnesium repleted; will continue to follow trend intermittently. -No further hypoglycemia appreciated; patient advised to maintain adequate hydration and nutrition.   2) acute metabolic encephalopathy superimposed on dementia/E. Coli UTI. -Overall much improved mentation and back to baseline at discharge. -E. Coli UTI treated with IV rocephin and subsequent oral cefdinir at discharge.  -Advised to maintain adequate hydration.   3) left wrist fracture status post   fall--EDP reduced fracture with imaging suggesting better alignment orthopedic consult appreciated with recommendations as follows:-NWB LUE, keep splint clean, dry and intact.  Elevate that hand as much as possible to aid in swelling of the hand.  -Continue as needed pain meds. -Tylenol and tramadol for analgesic medication regimen.   4) thoracic aortic aneurysm--- ascending thoracic aortic system with 5.5 cm -Admitting physician discussed with CTS surgeon- Dr. Dorris Fetch, patient should follow-up with cardiothoracic surgery at some point, but this does Not need to be  done urgently.   5)COPD/emphysema --- no acute exacerbation at  this time, continue bronchodilators. -No wheezing appreciated on exam at this time.   6)HTN--change metoprolol to Toprol-XL 50 mg daily for better BP and heart rate control. -Low-sodium diet discussed with patient.   7)Socia/Ethics--- admitting provider confirmed DNR status with patient's son Denise Mendoza over the phone. -Patient will be discharged back home with home health services and family care.   8) anemia/thrombocytopenia--- no bleeding concerns at this time, monitor closely and transfuse as clinically indicated. -Repeat CBC at follow-up visit to reassess stability.   9) anxiety disorder and insomnia---  -Continue adjusted dose of Xanax as needed  Procedures: See below for x-ray reports.  Consultations: Orthopedic service.  Discharge Exam: Vitals:   03/26/21 1019 03/26/21 1352  BP: (!) 100/47 (!) 115/59  Pulse: 87 97  Resp:  16  Temp:  98.2 F (36.8 C)  SpO2:  98%   General exam: Alert, awake, oriented x 3 today (demonstrating easy forgetfulness); in no major distress following commands appropriately and expressing some pain in her shoulder, lower back and left upper extremity. Respiratory system: No using accessory muscles; good saturation on room air.  Normal respiratory effort Cardiovascular system: S1 and S2, no rubs, no gallops, no JVD. Gastrointestinal system: Abdomen is nondistended, soft and nontender. No organomegaly or masses felt. Normal bowel sounds heard. Central nervous system: No focal deficits. Extremities: No cyanosis; left upper extremity with dressing/splint protecting her wrist in place.  Swelling hand and fingers appreciated on exam.  No clubbing, good capillary refill and no signs or evidence of compartment syndrome appreciated on exam. Skin: No petechiae. Psychiatry: Mood & affect appropriate.     Discharge Instructions   Discharge Instructions     Diet - low sodium heart healthy   Complete by: As directed    Discharge instructions   Complete by:  As directed    Take medications as prescribed No weightbearing on left upper extremity Outpatient follow-up with orthopedic service as instructed Follow-up with PCP in 2 weeks Maintain adequate hydration Follow heart healthy diet.      Allergies as of 03/26/2021   No Known Allergies      Medication List     STOP taking these medications    methylPREDNISolone 4 MG Tbpk tablet Commonly known as: MEDROL DOSEPAK   metoprolol tartrate 25 MG tablet Commonly known as: LOPRESSOR       TAKE these medications    acetaminophen 325 MG tablet Commonly known as: TYLENOL Take 2 tablets (650 mg total) by mouth every 6 (six) hours as needed for mild pain or headache (or Fever >/= 101).   ALPRAZolam 0.5 MG tablet Commonly known as: XANAX Take 1 mg by mouth 2 (two) times daily.   amitriptyline 10 MG tablet Commonly known as: ELAVIL TAKE ONE TABLET BY MOUTH AT BEDTIME   aspirin 81 MG EC tablet Take 1 tablet (81 mg total) by mouth daily with breakfast. Swallow whole. Start taking on: March 27, 2021 What changed:  medication strength how much to take additional instructions   cefdinir 300 MG capsule Commonly known as: OMNICEF Take 1 capsule (300 mg total) by mouth 2 (two) times daily for 2 days.   guaiFENesin-dextromethorphan 100-10 MG/5ML syrup Commonly known as: ROBITUSSIN DM Take 10 mLs by mouth every 8 (eight) hours.   Ipratropium-Albuterol 20-100 MCG/ACT Aers respimat Commonly known as: COMBIVENT Inhale 1 puff into the lungs every 6 (six) hours as needed for wheezing or shortness of breath.  metoprolol succinate 50 MG 24 hr tablet Commonly known as: TOPROL-XL Take 1 tablet (50 mg total) by mouth daily. Take with or immediately following a meal. Start taking on: March 27, 2021   simvastatin 40 MG tablet Commonly known as: ZOCOR Take 1 tablet (40 mg total) by mouth every evening.   traMADol 50 MG tablet Commonly known as: ULTRAM Take 1 tablet (50 mg total)  by mouth every 8 (eight) hours as needed for up to 7 days for severe pain. What changed:  when to take this reasons to take this       No Known Allergies  Follow-up Information     [facility] Follow up.   Specialty: Home Health Services Why: PT/OT will call to schedule your first home visit.        Denise Specking, MD. Schedule an appointment as soon as possible for a visit in 2 week(s).   Specialty: Internal Medicine Contact information: [address] [place] [address] [phone]         Oliver Barre, MD. Schedule an appointment as soon as possible for a visit in 10 day(s).   Specialties: Orthopedic Surgery, Sports Medicine Contact information: [address] [place] [address] [phone]                 The results of significant diagnostics from this hospitalization (including imaging, microbiology, ancillary and laboratory) are listed below for reference.    Significant Diagnostic Studies: DG Chest 2 View  Result Date: 03/22/2021 CLINICAL DATA:  Shortness of breath.  Fall. EXAM: CHEST - 2 VIEW COMPARISON:  January 17, 2021. FINDINGS: Stable cardiomegaly with aneurysmal dilatation of the thoracic aorta. Both lungs are clear. The visualized skeletal structures are unremarkable. IMPRESSION: Stable cardiomegaly with aneurysmal dilatation of thoracic aorta. CT scan is recommended for further evaluation. Electronically Signed   By: Lupita Raider M.D.   On: 03/22/2021 14:45   DG Pelvis 1-2 Views  Result Date: 03/22/2021 CLINICAL DATA:  Left hip pain after fall. EXAM: PELVIS - 1-2 VIEW COMPARISON:  January 03, 2020. FINDINGS: There is no evidence of acute pelvic fracture or diastasis. Status post surgical internal fixation of old proximal right femoral fracture. IMPRESSION: No acute abnormality seen. Electronically Signed   By: Lupita Raider M.D.   On: 03/22/2021 14:49   DG Forearm Left  Result Date: 03/22/2021 CLINICAL DATA:  Left  forearm pain after fall. EXAM: LEFT FOREARM - 2 VIEW COMPARISON:  None. FINDINGS: Severely displaced and probably comminuted distal left radial fracture is noted. The ulna appears grossly unremarkable. IMPRESSION: Severely displaced and probably comminuted distal left radial fracture. Electronically Signed   By: Lupita Raider M.D.   On: 03/22/2021 14:47   DG Wrist 2 Views Left  Result Date: 03/23/2021 CLINICAL DATA:  87 year old female left wrist fracture post reduction. EXAM: LEFT WRIST - 2 VIEW COMPARISON:  03/22/2021. FINDINGS: Two views with cast material in place. Improved alignment at the comminuted distal left radius fracture. Decreased impaction and dorsal angulation since the splint images yesterday. Probable ulnar styloid fracture. Underlying osteopenia. No new osseous abnormality identified. IMPRESSION: Improved and satisfactory alignment at the comminuted distal left radius fracture. Electronically Signed   By: Odessa Fleming M.D.   On: 03/23/2021 09:33   DG Wrist 2 Views Left  Result Date: 03/22/2021 CLINICAL DATA:  Left wrist fracture EXAM: LEFT WRIST - 2 VIEW COMPARISON:  Left forearm radiograph dated 03/22/2021 FINDINGS: Comminuted distal  radial fracture with intra-articular extension. Mild apex volar angulation. Soft tissue swelling. Overlying cast obscures fine osseous detail. IMPRESSION: Comminuted distal radial fracture, as above.  Overlying cast. Electronically Signed   By: Charline Bills M.D.   On: 03/22/2021 22:26   CT Head Wo Contrast  Result Date: 03/22/2021 CLINICAL DATA:  An 87 year old female presents with altered mental status and potential fall. EXAM: CT HEAD WITHOUT CONTRAST TECHNIQUE: Contiguous axial images were obtained from the base of the skull through the vertex without intravenous contrast. COMPARISON:  December 31, 2019. FINDINGS: Brain: No evidence of acute infarction, hemorrhage, hydrocephalus, extra-axial collection or mass lesion/mass effect. Signs of chronic  microvascular ischemic change with mild asymmetry not changed since July of 2021. Age related atrophy as before. Vascular: No hyperdense vessel or unexpected calcification. Skull: Normal. Negative for fracture or focal lesion. Sinuses/Orbits: Visualized paranasal sinuses and orbits are unremarkable. Other: None. IMPRESSION: No acute intracranial abnormality. Signs of chronic microvascular ischemic change with mild asymmetry not changed since July of 2021. Electronically Signed   By: Donzetta Kohut M.D.   On: 03/22/2021 14:25   CT Chest W Contrast  Result Date: 03/22/2021 CLINICAL DATA:  Chest pain. EXAM: CT CHEST WITH CONTRAST TECHNIQUE: Multidetector CT imaging of the chest was performed during intravenous contrast administration. CONTRAST:  60mL OMNIPAQUE IOHEXOL 350 MG/ML SOLN COMPARISON:  Radiograph of same day. FINDINGS: Cardiovascular: 5.5 cm ascending thoracic aortic aneurysm is noted. No dissection is noted. Atherosclerosis of thoracic aorta is noted. 4.7 cm aneurysm is seen involving the distal portion of the transverse aortic arch. Mild cardiomegaly is noted. No pericardial effusion is noted. Mediastinum/Nodes: No enlarged mediastinal, hilar, or axillary lymph nodes. Thyroid gland, trachea, and esophagus demonstrate no significant findings. Lungs/Pleura: No pneumothorax or pleural effusion is noted. Mild right apical scarring is noted. Left lung is unremarkable. Upper Abdomen: No acute abnormality. Musculoskeletal: Multiple old upper thoracic vertebral body compression fractures are noted. No acute abnormality is noted. IMPRESSION: 5.5 cm ascending thoracic aortic aneurysm. Cardiothoracic surgery consultation recommended due to increased risk of rupture for arch aneurysm ? 5.5 cm. This recommendation follows 2010 ACCF/AHA/AATS/ACR/ASA/SCA/SCAI/SIR/STS/SVM Guidelines for the Diagnosis and Management of Patients With Thoracic Aortic Disease. Circulation. 2010; 121: Z610-R604. Aortic aneurysm NOS  (ICD10-I71.9). 4.7 cm aneurysm is seen involving distal portion of transverse aortic arch. Aortic Atherosclerosis (ICD10-I70.0). Electronically Signed   By: Lupita Raider M.D.   On: 03/22/2021 19:39    Microbiology: Recent Results (from the past 240 hour(s))  Resp Panel by RT-PCR (Flu A&B, Covid) Nasopharyngeal Swab     Status: None   Collection Time: 03/22/21  5:32 PM   Specimen: Nasopharyngeal Swab; Nasopharyngeal(NP) swabs in vial transport medium  Result Value Ref Range Status   SARS Coronavirus 2 by RT PCR NEGATIVE NEGATIVE Final    Comment: (NOTE) SARS-CoV-2 target nucleic acids are NOT DETECTED.  The SARS-CoV-2 RNA is generally detectable in upper respiratory specimens during the acute phase of infection. The lowest concentration of SARS-CoV-2 viral copies this assay can detect is 138 copies/mL. A negative result does not preclude SARS-Cov-2 infection and should not be used as the sole basis for treatment or other patient management decisions. A negative result may occur with  improper specimen collection/handling, submission of specimen other than nasopharyngeal swab, presence of viral mutation(s) within the areas targeted by this assay, and inadequate number of viral copies(<138 copies/mL). A negative result must be combined with clinical observations, patient history, and epidemiological information. The expected result is Negative.  Fact Sheet for Patients:  BloggerCourse.com  Fact Sheet for Healthcare Providers:  SeriousBroker.it  This test is no t yet approved or cleared by the [place] FDA and  has been authorized for detection and/or diagnosis of SARS-CoV-2 by FDA under an Emergency Use Authorization (EUA). This EUA will remain  in effect (meaning this test can be used) for the duration of the COVID-19 declaration under Section 564(b)(1) of the Act, 21 U.S.C.section 360bbb-3(b)(1), unless the authorization is  terminated  or revoked sooner.       Influenza A by PCR NEGATIVE NEGATIVE Final   Influenza B by PCR NEGATIVE NEGATIVE Final    Comment: (NOTE) The Xpert Xpress SARS-CoV-2/FLU/RSV plus assay is intended as an aid in the diagnosis of influenza from Nasopharyngeal swab specimens and should not be used as a sole basis for treatment. Nasal washings and aspirates are unacceptable for Xpert Xpress SARS-CoV-2/FLU/RSV testing.  Fact Sheet for Patients: BloggerCourse.com  Fact Sheet for Healthcare Providers: SeriousBroker.it  This test is not yet approved or cleared by the [place] FDA and has been authorized for detection and/or diagnosis of SARS-CoV-2 by FDA under an Emergency Use Authorization (EUA). This EUA will remain in effect (meaning this test can be used) for the duration of the COVID-19 declaration under Section 564(b)(1) of the Act, 21 U.S.C. section 360bbb-3(b)(1), unless the authorization is terminated or revoked.  Performed at [facility], [address] [place], [place] [address]   Urine Culture     Status: Abnormal   Collection Time: 03/23/21  5:20 PM   Specimen: Urine, Clean Catch  Result Value Ref Range Status   Specimen Description   Final    URINE, CLEAN CATCH Performed at [facility], [address] [place], [place] [address]    Special Requests   Final    NONE Performed at [facility], [address] [place], [place] [address]    Culture 80,000 COLONIES/mL ESCHERICHIA COLI (A)  Final   Report Status 03/26/2021 FINAL  Final   Organism ID, Bacteria ESCHERICHIA COLI (A)  Final      Susceptibility   Escherichia coli - MIC*    AMPICILLIN >=32 RESISTANT Resistant     CEFAZOLIN <=4 SENSITIVE Sensitive     CEFEPIME <=0.12 SENSITIVE Sensitive     CEFTRIAXONE <=0.25 SENSITIVE Sensitive     CIPROFLOXACIN >=4 RESISTANT Resistant     GENTAMICIN <=1 SENSITIVE Sensitive     IMIPENEM <=0.25 SENSITIVE Sensitive      NITROFURANTOIN 128 RESISTANT Resistant     TRIMETH/SULFA <=20 SENSITIVE Sensitive     AMPICILLIN/SULBACTAM >=32 RESISTANT Resistant     PIP/TAZO <=4 SENSITIVE Sensitive     * 80,000 COLONIES/mL ESCHERICHIA COLI     Labs: Basic Metabolic Panel: Recent Labs  Lab 03/22/21 1310 03/23/21 0338 03/24/21 0556 03/26/21 0419  NA 138 140 138 138  K 2.6* 3.8 3.1* 3.3*  CL 102 109 104 108  CO2 25 22 27 25   GLUCOSE 81 123* 111* 98  BUN 14 8 7* 18  CREATININE 0.61 0.38* 0.43* 0.50  CALCIUM 8.7* 7.6* 8.0* 8.0*  MG 1.4*  --   --  1.6*  PHOS  --   --  2.3*  --    Liver Function Tests: Recent Labs  Lab 03/22/21 1310 03/24/21 0556  AST 24  --   ALT 12  --   ALKPHOS 58  --   BILITOT 1.7*  --   PROT 6.7  --   ALBUMIN 3.8  3.1*   CBC: Recent Labs  Lab 03/22/21 1310 03/23/21 0338 03/24/21 0556 03/25/21 0510 03/26/21 0419  WBC 5.8 6.2 7.5 5.5 5.2  NEUTROABS 4.6  --   --   --   --   HGB 11.6* 10.3* 11.0* 10.8* 10.5*  HCT 36.7 33.8* 35.1* 34.6* 34.8*  MCV 96.1 99.7 96.7 98.3 99.1  PLT 117* 100* 92* 83* 92*   CBG: Recent Labs  Lab 03/22/21 2222  GLUCAP 69*   Signed:  Vassie Loll MD.  [facility] 03/26/2021, 3:18 PM

## 2021-03-26 NOTE — Progress Notes (Signed)
Nsg Discharge Note  Admit Date:  03/22/2021 Discharge date: 03/26/2021   Denise Mendoza to be D/C'd Home per MD order.  AVS completed.  Copy for chart, and copy for patient signed, and dated. Patient/caregiver able to verbalize understanding.  Discharge Medication: Allergies as of 03/26/2021   No Known Allergies      Medication List     STOP taking these medications    methylPREDNISolone 4 MG Tbpk tablet Commonly known as: MEDROL DOSEPAK   metoprolol tartrate 25 MG tablet Commonly known as: LOPRESSOR       TAKE these medications    acetaminophen 325 MG tablet Commonly known as: TYLENOL Take 2 tablets (650 mg total) by mouth every 6 (six) hours as needed for mild pain or headache (or Fever >/= 101).   ALPRAZolam 0.5 MG tablet Commonly known as: XANAX Take 1 mg by mouth 2 (two) times daily.   amitriptyline 10 MG tablet Commonly known as: ELAVIL TAKE ONE TABLET BY MOUTH AT BEDTIME   aspirin 81 MG EC tablet Take 1 tablet (81 mg total) by mouth daily with breakfast. Swallow whole. Start taking on: March 27, 2021 What changed:  medication strength how much to take additional instructions   cefdinir 300 MG capsule Commonly known as: OMNICEF Take 1 capsule (300 mg total) by mouth 2 (two) times daily for 2 days.   guaiFENesin-dextromethorphan 100-10 MG/5ML syrup Commonly known as: ROBITUSSIN DM Take 10 mLs by mouth every 8 (eight) hours.   Ipratropium-Albuterol 20-100 MCG/ACT Aers respimat Commonly known as: COMBIVENT Inhale 1 puff into the lungs every 6 (six) hours as needed for wheezing or shortness of breath.   metoprolol succinate 50 MG 24 hr tablet Commonly known as: TOPROL-XL Take 1 tablet (50 mg total) by mouth daily. Take with or immediately following a meal. Start taking on: March 27, 2021   simvastatin 40 MG tablet Commonly known as: ZOCOR Take 1 tablet (40 mg total) by mouth every evening.   traMADol 50 MG tablet Commonly known as:  ULTRAM Take 1 tablet (50 mg total) by mouth every 8 (eight) hours as needed for up to 7 days for severe pain. What changed:  when to take this reasons to take this        Discharge Assessment: Vitals:   03/26/21 1019 03/26/21 1352  BP: (!) 100/47 (!) 115/59  Pulse: 87 97  Resp:  16  Temp:  98.2 F (36.8 C)  SpO2:  98%   Skin clean, dry and intact without evidence of skin break down, no evidence of skin tears noted. IV catheter discontinued intact. Site without signs and symptoms of complications - no redness or edema noted at insertion site, patient denies c/o pain - only slight tenderness at site.  Dressing with slight pressure applied.  D/c Instructions-Education: Discharge instructions given to patient/family with verbalized understanding. D/c education completed with patient/family including follow up instructions, medication list, d/c activities limitations if indicated, with other d/c instructions as indicated by MD - patient able to verbalize understanding, all questions fully answered. Patient instructed to return to ED, call 911, or call MD for any changes in condition.  Patient escorted via WC, and D/C home via private auto.  Brandy Hale, RN 03/26/2021 3:48 PM

## 2021-03-26 NOTE — Evaluation (Addendum)
Physical Therapy Evaluation Patient Details Name: Denise Mendoza MRN: [id number] DOB: 23-Apr-1932 Today's Date: 03/26/2021  History of Present Illness  Denise Mendoza is a 87 y.o. female with medical history significant for hypertension, peripheral vascular disease.  Patient was brought to the ED by family.  Member.  Patient lives alone, patient's son reported that patient has been confused and talking out of her head for 3 days.   Per triage notes, initially patient was able to tell place and date and year. At the time of my evaluation, patient is intermittently confused, sometimes able to answer questions appropriately.     Recent hospitalization 01/2021 for acute respiratory failure with hypoxia secondary to COVID-pneumonia, treated with remdesivir and steroids.     I called patient's son, Fredrik Cove who is HCPOA.  At baseline, patient is independent of all ADLs.  Patient ambulates with a cane. He is not aware of any memory problems.  Patient lives alone.  Last checked on patient is able on the 17th.  He does not know when patient fell or how she sustained a fracture to her wrist.   Clinical Impression  Patient in bed awake, alert, and agreeable for therapy. Patient required assistance for transitioning from supine to long sitting, was limited d/t L UE NWB precaution but was able to scoot to EOB w/ increased time. Patient demonstrated the ability to transition to and use the [facility] w/ supervision/Mig guard A. Patient demonstrated occasional unsteadiness using cane, no loss of balance, distance limited d/t fatigue. Patient tolerated sitting up in chair after therapy w/ granddaughter at bedside. Patient will benefit from continued physical therapy in hospital and recommended venue below to increase strength, balance, endurance for safe ADLs and gait.      Recommendations for follow up therapy are one component of a multi-disciplinary discharge planning process, led by the attending physician.   Recommendations may be updated based on patient status, additional functional criteria and insurance authorization.  Follow Up Recommendations Skilled nursing-short term rehab (<3 hours/day)    Assistance Recommended at Discharge Intermittent Supervision/Assistance  Functional Status Assessment Patient has had a recent decline in their functional status and demonstrates the ability to make significant improvements in function in a reasonable and predictable amount of time.  Equipment Recommendations  None recommended by PT    Recommendations for Other Services       Precautions / Restrictions Precautions Precautions: Fall Restrictions Weight Bearing Restrictions: Yes LUE Weight Bearing: Non weight bearing      Mobility  Bed Mobility Overal bed mobility: Needs Assistance Bed Mobility: Supine to Sit     Supine to sit: Min assist;Mod assist     General bed mobility comments: HOB flat, patient unable to use L UE due to NWB precautions    Transfers Overall transfer level: Needs assistance Equipment used: None Transfers: Sit to/from [employer] Sit to Stand: Min guard;Supervision Stand pivot transfers: Supervision         General transfer comment: increased time    Ambulation/Gait Ambulation/Gait assistance: Min guard;Supervision Gait Distance (Feet): 100 Feet Assistive device: Straight cane Gait Pattern/deviations: Step-through pattern;Decreased step length - right;Decreased step length - left;Decreased stride length Gait velocity: Decreased   General Gait Details: occasional slight unsteadiness, limited d/t fatigue  Stairs            Wheelchair Mobility    Modified Rankin (Stroke Patients Only)       Balance Overall balance assessment: Needs assistance Sitting-balance support: Feet supported;No upper extremity supported  Sitting balance-Leahy Scale: Good Sitting balance - Comments: Seated at EOB   Standing balance support: Single  extremity supported;During functional activity Standing balance-Leahy Scale: Fair Standing balance comment: good/fair using cane                             Pertinent Vitals/Pain Pain Assessment: Faces Faces Pain Scale: Hurts a little bit Breathing: normal Negative Vocalization: none Facial Expression: smiling or inexpressive Body Language: relaxed Consolability: no need to console PAINAD Score: 0 Pain Location: L UE Pain Descriptors / Indicators: Sore;Discomfort Pain Intervention(s): Limited activity within patient's tolerance;Monitored during session    Home Living Family/patient expects to be discharged to:: Private residence Living Arrangements: Alone Available Help at Discharge: Family;Available PRN/intermittently;Skilled Nursing Facility Type of Home: House Home Access: Stairs to enter Entrance Stairs-Rails: None Entrance Stairs-Number of Steps: 1   Home Layout: One level Home Equipment: Cane - single Librarian, academic (2 wheels);BSC      Prior Function Prior Level of Function : Independent/Modified Independent             Mobility Comments: Reports using cane at baseline for household and community ambulation ADLs Comments: Reports that she is independent w/ ADLs, does not drive, receives meals from [employer] Whe[employer]     Hand Dominance   Dominant Hand: Right    Extremity/Trunk Assessment   Upper Extremity Assessment Upper Extremity Assessment: LUE deficits/detail LUE Deficits / Details: Wearing a forearm cast, presents w/ swelling and bruising LUE: Unable to fully assess due to immobilization;Unable to fully assess due to pain    Lower Extremity Assessment Lower Extremity Assessment: Generalized weakness    Cervical / Trunk Assessment Cervical / Trunk Assessment: Normal  Communication   Communication: No difficulties  Cognition Arousal/Alertness: Awake/alert Behavior During Therapy: WFL for tasks assessed/performed Overall Cognitive  Status: Within Functional Limits for tasks assessed                                          General Comments      Exercises     Assessment/Plan    PT Assessment Patient needs continued PT services  PT Problem List Decreased strength;Decreased mobility;Decreased safety awareness;Decreased range of motion;Decreased coordination;Decreased activity tolerance;Decreased balance;Decreased knowledge of use of DME       PT Treatment Interventions DME instruction;Therapeutic exercise;Gait training;Balance training;Stair training;Functional mobility training;Therapeutic activities;Patient/family education    PT Goals (Current goals can be found in the Care Plan section)  Acute Rehab PT Goals Patient Stated Goal: Return home after rehab PT Goal Formulation: With patient Time For Goal Achievement: 04/09/21 Potential to Achieve Goals: Good    Frequency Min 3X/week   Barriers to discharge        Co-evaluation               AM-PAC PT "6 Clicks" Mobility  Outcome Measure Help needed turning from your back to your side while in a flat bed without using bedrails?: A Little Help needed moving from lying on your back to sitting on the side of a flat bed without using bedrails?: A Little Help needed moving to and from a bed to a chair (including a wheelchair)?: A Little Help needed standing up from a chair using your arms (e.g., wheelchair or bedside chair)?: A Little Help needed to walk in hospital room?: A Little Help needed climbing  3-5 steps with a railing? : A Lot 6 Click Score: 17    End of Session Equipment Utilized During Treatment: Gait belt Activity Tolerance: Patient tolerated treatment well;No increased pain;Patient limited by fatigue Patient left: in chair;with call bell/phone within reach;with chair alarm set;with family/visitor present Nurse Communication: Mobility status PT Visit Diagnosis: Unsteadiness on feet (R26.81);Other abnormalities of gait  and mobility (R26.89);Muscle weakness (generalized) (M62.81)    Time: 5638-9373 PT Time Calculation (min) (ACUTE ONLY): 30 min   Charges:     PT Treatments $Therapeutic Activity: 23-37 mins        Cassie Jones, SPT  During this treatment session, the therapist was present, participating in and directing the treatment.  2:25 PM, 03/26/21 Ocie Bob, MPT Physical Therapist with [facility] 336 [phone] office [phone] mobile phone

## 2021-03-26 NOTE — TOC Initial Note (Addendum)
Transition of Care [facility]) - Initial/Assessment Note    Patient Details  Name: Denise Mendoza MRN: [id number] Date of Birth: 04/18/1932  Transition of [facility] CM/SW Contact:    Leitha Bleak, RN Phone Number: 03/26/2021, 11:40 AM  Clinical Narrative:    Patient admitted with acute metabolic encephalopathy. PT is recommending SNF. Grand daughter at the bedside. She states patients son will stay home with her, she has all equipment needed. She is requesting HHPT/OT. Asked MD for orders. Linda with [facility] accepted the referral.    Addendum: MD adding SW to [facility] orders to assess for other needs in the home.               Expected Discharge Plan: Home w Home Health Services Barriers to Discharge: Continued Medical Work up  Patient Goals and CMS Choice Patient states their goals for this hospitalization and ongoing recovery are:: to go home. CMS Medicare.gov Compare Post Acute Care list provided to:: Patient Represenative (must comment) Choice offered to / list presented to : Adult Children  Expected Discharge Plan and Services Expected Discharge Plan: Home w Home Health Services      Living arrangements for the past 2 months: Single Family Home                    HH Arranged: PT, OT HH Agency: [facility] Date HH Agency Contacted: 03/26/21 Time HH Agency Contacted: 1140 Representative spoke with at [facility] Agency: Bonita Quin  Prior Living Arrangements/Services Living arrangements for the past 2 months: Single Family Home Lives with:: Adult Children Patient language and need for interpreter reviewed:: Yes        Need for Family Participation in Patient Care: Yes (Comment) Care giver support system in place?: Yes (comment)   Criminal Activity/Legal Involvement Pertinent to Current Situation/Hospitalization: No - Comment as needed  Activities of Daily Living Home Assistive Devices/Equipment: Cane (specify quad or straight) ADL Screening (condition  at time of admission) Patient's cognitive ability adequate to safely complete daily activities?: Yes Is the patient deaf or have difficulty hearing?: No Does the patient have difficulty seeing, even when wearing glasses/contacts?: No Does the patient have difficulty concentrating, remembering, or making decisions?: Yes Patient able to express need for assistance with ADLs?: Yes Does the patient have difficulty dressing or bathing?: Yes Independently performs ADLs?: Yes (appropriate for developmental age) Does the patient have difficulty walking or climbing stairs?: Yes Weakness of Legs: Both Weakness of Arms/Hands: None  Permission Sought/Granted       Permission granted to share info w Relationship: Grand daughter     Emotional Assessment     Alcohol / Substance Use: Not Applicable Psych Involvement: No (comment)  Admission diagnosis:  Wrist fracture [S62.109A] Fall [W19.XXXA] Left wrist fracture [S62.102A] Closed fracture of left wrist, sequela [S62.102S] Altered mental status, unspecified altered mental status type [R41.82] Closed fracture of distal end of radius, unspecified fracture morphology, unspecified laterality, initial encounter [S52.509A] AMS (altered mental status) [R41.82] Patient Active Problem List   Diagnosis Date Noted   Acute metabolic encephalopathy 03/22/2021   Left wrist fracture, closed, initial encounter 03/22/2021   Left wrist fracture 03/22/2021   Pneumonia due to COVID-19 virus 01/17/2021   Acute cystitis without hematuria    Pulmonary emphysema (HCC)    Closed displaced fracture of right femoral neck with routine healing 01/01/2020   Hypokalemia 01/01/2020   Dehydration 01/01/2020   Respiratory failure with hypoxia (HCC) 01/01/2020   Vitamin B12 deficiency  01/01/2020   Thrombocytopenia (HCC) 01/01/2020   Leukocytosis 01/01/2020   Acute lower UTI 01/01/2020   Essential hypertension 01/01/2020   Hyperlipidemia 01/01/2020   Altered mental  status 12/31/2019   Bilateral leg pain 05/12/2012   Hip pain 05/12/2012   PCP:  Ignatius Specking, MD Pharmacy:   [facility] - [place], [place] - [address] [place] [address] Phone: [phone] Fax: [phone]   Readmission Risk Interventions Readmission Risk Prevention Plan 03/26/2021  Transportation Screening Complete  Home Care Screening Complete  Medication Review (RN CM) Complete  Some recent data might be hidden

## 2021-03-27 DIAGNOSIS — F0390 Unspecified dementia without behavioral disturbance: Secondary | ICD-10-CM | POA: Diagnosis not present

## 2021-03-27 DIAGNOSIS — J439 Emphysema, unspecified: Secondary | ICD-10-CM | POA: Diagnosis not present

## 2021-03-27 DIAGNOSIS — Z79891 Long term (current) use of opiate analgesic: Secondary | ICD-10-CM | POA: Diagnosis not present

## 2021-03-27 DIAGNOSIS — D649 Anemia, unspecified: Secondary | ICD-10-CM | POA: Diagnosis not present

## 2021-03-27 DIAGNOSIS — F419 Anxiety disorder, unspecified: Secondary | ICD-10-CM | POA: Diagnosis not present

## 2021-03-27 DIAGNOSIS — G4700 Insomnia, unspecified: Secondary | ICD-10-CM | POA: Diagnosis not present

## 2021-03-27 DIAGNOSIS — Z8701 Personal history of pneumonia (recurrent): Secondary | ICD-10-CM | POA: Diagnosis not present

## 2021-03-27 DIAGNOSIS — I739 Peripheral vascular disease, unspecified: Secondary | ICD-10-CM | POA: Diagnosis not present

## 2021-03-27 DIAGNOSIS — S52501D Unspecified fracture of the lower end of right radius, subsequent encounter for closed fracture with routine healing: Secondary | ICD-10-CM | POA: Diagnosis not present

## 2021-03-27 DIAGNOSIS — I712 Thoracic aortic aneurysm, without rupture, unspecified: Secondary | ICD-10-CM | POA: Diagnosis not present

## 2021-03-27 DIAGNOSIS — Z8616 Personal history of COVID-19: Secondary | ICD-10-CM | POA: Diagnosis not present

## 2021-03-27 DIAGNOSIS — M549 Dorsalgia, unspecified: Secondary | ICD-10-CM | POA: Diagnosis not present

## 2021-03-27 DIAGNOSIS — Z9181 History of falling: Secondary | ICD-10-CM | POA: Diagnosis not present

## 2021-03-27 DIAGNOSIS — G9341 Metabolic encephalopathy: Secondary | ICD-10-CM | POA: Diagnosis not present

## 2021-03-27 DIAGNOSIS — E785 Hyperlipidemia, unspecified: Secondary | ICD-10-CM | POA: Diagnosis not present

## 2021-03-27 DIAGNOSIS — B9620 Unspecified Escherichia coli [E. coli] as the cause of diseases classified elsewhere: Secondary | ICD-10-CM | POA: Diagnosis not present

## 2021-03-27 DIAGNOSIS — I10 Essential (primary) hypertension: Secondary | ICD-10-CM | POA: Diagnosis not present

## 2021-03-27 DIAGNOSIS — N390 Urinary tract infection, site not specified: Secondary | ICD-10-CM | POA: Diagnosis not present

## 2021-03-27 DIAGNOSIS — E876 Hypokalemia: Secondary | ICD-10-CM | POA: Diagnosis not present

## 2021-03-27 DIAGNOSIS — Z7982 Long term (current) use of aspirin: Secondary | ICD-10-CM | POA: Diagnosis not present

## 2021-03-27 DIAGNOSIS — W19XXXD Unspecified fall, subsequent encounter: Secondary | ICD-10-CM | POA: Diagnosis not present

## 2021-03-27 DIAGNOSIS — D696 Thrombocytopenia, unspecified: Secondary | ICD-10-CM | POA: Diagnosis not present

## 2021-03-28 DIAGNOSIS — I10 Essential (primary) hypertension: Secondary | ICD-10-CM | POA: Diagnosis not present

## 2021-03-28 DIAGNOSIS — S52501D Unspecified fracture of the lower end of right radius, subsequent encounter for closed fracture with routine healing: Secondary | ICD-10-CM | POA: Diagnosis not present

## 2021-03-28 DIAGNOSIS — G9341 Metabolic encephalopathy: Secondary | ICD-10-CM | POA: Diagnosis not present

## 2021-03-28 DIAGNOSIS — N390 Urinary tract infection, site not specified: Secondary | ICD-10-CM | POA: Diagnosis not present

## 2021-03-28 DIAGNOSIS — B9620 Unspecified Escherichia coli [E. coli] as the cause of diseases classified elsewhere: Secondary | ICD-10-CM | POA: Diagnosis not present

## 2021-03-28 DIAGNOSIS — J439 Emphysema, unspecified: Secondary | ICD-10-CM | POA: Diagnosis not present

## 2021-03-30 DIAGNOSIS — S52501D Unspecified fracture of the lower end of right radius, subsequent encounter for closed fracture with routine healing: Secondary | ICD-10-CM | POA: Diagnosis not present

## 2021-03-30 DIAGNOSIS — I10 Essential (primary) hypertension: Secondary | ICD-10-CM | POA: Diagnosis not present

## 2021-03-30 DIAGNOSIS — N390 Urinary tract infection, site not specified: Secondary | ICD-10-CM | POA: Diagnosis not present

## 2021-03-30 DIAGNOSIS — G9341 Metabolic encephalopathy: Secondary | ICD-10-CM | POA: Diagnosis not present

## 2021-03-30 DIAGNOSIS — J439 Emphysema, unspecified: Secondary | ICD-10-CM | POA: Diagnosis not present

## 2021-03-30 DIAGNOSIS — B9620 Unspecified Escherichia coli [E. coli] as the cause of diseases classified elsewhere: Secondary | ICD-10-CM | POA: Diagnosis not present

## 2021-04-02 DIAGNOSIS — S52501D Unspecified fracture of the lower end of right radius, subsequent encounter for closed fracture with routine healing: Secondary | ICD-10-CM | POA: Diagnosis not present

## 2021-04-02 DIAGNOSIS — G9341 Metabolic encephalopathy: Secondary | ICD-10-CM | POA: Diagnosis not present

## 2021-04-02 DIAGNOSIS — N390 Urinary tract infection, site not specified: Secondary | ICD-10-CM | POA: Diagnosis not present

## 2021-04-02 DIAGNOSIS — I10 Essential (primary) hypertension: Secondary | ICD-10-CM | POA: Diagnosis not present

## 2021-04-02 DIAGNOSIS — B9620 Unspecified Escherichia coli [E. coli] as the cause of diseases classified elsewhere: Secondary | ICD-10-CM | POA: Diagnosis not present

## 2021-04-02 DIAGNOSIS — J439 Emphysema, unspecified: Secondary | ICD-10-CM | POA: Diagnosis not present

## 2021-04-03 ENCOUNTER — Telehealth: Payer: Self-pay | Admitting: Orthopedic Surgery

## 2021-04-03 DIAGNOSIS — J439 Emphysema, unspecified: Secondary | ICD-10-CM | POA: Diagnosis not present

## 2021-04-03 DIAGNOSIS — G9341 Metabolic encephalopathy: Secondary | ICD-10-CM | POA: Diagnosis not present

## 2021-04-03 DIAGNOSIS — B9620 Unspecified Escherichia coli [E. coli] as the cause of diseases classified elsewhere: Secondary | ICD-10-CM | POA: Diagnosis not present

## 2021-04-03 DIAGNOSIS — I10 Essential (primary) hypertension: Secondary | ICD-10-CM | POA: Diagnosis not present

## 2021-04-03 DIAGNOSIS — N390 Urinary tract infection, site not specified: Secondary | ICD-10-CM | POA: Diagnosis not present

## 2021-04-03 DIAGNOSIS — S52501D Unspecified fracture of the lower end of right radius, subsequent encounter for closed fracture with routine healing: Secondary | ICD-10-CM | POA: Diagnosis not present

## 2021-04-03 NOTE — Telephone Encounter (Signed)
Reached patient and son - scheduled; aware of the appointment - per call from [facility].

## 2021-04-04 DIAGNOSIS — S52501D Unspecified fracture of the lower end of right radius, subsequent encounter for closed fracture with routine healing: Secondary | ICD-10-CM | POA: Diagnosis not present

## 2021-04-04 DIAGNOSIS — J439 Emphysema, unspecified: Secondary | ICD-10-CM | POA: Diagnosis not present

## 2021-04-04 DIAGNOSIS — N390 Urinary tract infection, site not specified: Secondary | ICD-10-CM | POA: Diagnosis not present

## 2021-04-04 DIAGNOSIS — G9341 Metabolic encephalopathy: Secondary | ICD-10-CM | POA: Diagnosis not present

## 2021-04-04 DIAGNOSIS — I10 Essential (primary) hypertension: Secondary | ICD-10-CM | POA: Diagnosis not present

## 2021-04-04 DIAGNOSIS — B9620 Unspecified Escherichia coli [E. coli] as the cause of diseases classified elsewhere: Secondary | ICD-10-CM | POA: Diagnosis not present

## 2021-04-05 DIAGNOSIS — F419 Anxiety disorder, unspecified: Secondary | ICD-10-CM | POA: Diagnosis not present

## 2021-04-05 DIAGNOSIS — I10 Essential (primary) hypertension: Secondary | ICD-10-CM | POA: Diagnosis not present

## 2021-04-05 DIAGNOSIS — W19XXXA Unspecified fall, initial encounter: Secondary | ICD-10-CM | POA: Diagnosis not present

## 2021-04-05 DIAGNOSIS — Z299 Encounter for prophylactic measures, unspecified: Secondary | ICD-10-CM | POA: Diagnosis not present

## 2021-04-05 DIAGNOSIS — Z681 Body mass index (BMI) 19 or less, adult: Secondary | ICD-10-CM | POA: Diagnosis not present

## 2021-04-06 DIAGNOSIS — G9341 Metabolic encephalopathy: Secondary | ICD-10-CM | POA: Diagnosis not present

## 2021-04-06 DIAGNOSIS — N390 Urinary tract infection, site not specified: Secondary | ICD-10-CM | POA: Diagnosis not present

## 2021-04-06 DIAGNOSIS — B9620 Unspecified Escherichia coli [E. coli] as the cause of diseases classified elsewhere: Secondary | ICD-10-CM | POA: Diagnosis not present

## 2021-04-06 DIAGNOSIS — S52501D Unspecified fracture of the lower end of right radius, subsequent encounter for closed fracture with routine healing: Secondary | ICD-10-CM | POA: Diagnosis not present

## 2021-04-06 DIAGNOSIS — J439 Emphysema, unspecified: Secondary | ICD-10-CM | POA: Diagnosis not present

## 2021-04-06 DIAGNOSIS — I10 Essential (primary) hypertension: Secondary | ICD-10-CM | POA: Diagnosis not present

## 2021-04-10 ENCOUNTER — Ambulatory Visit (INDEPENDENT_AMBULATORY_CARE_PROVIDER_SITE_OTHER): Payer: Medicare Other | Admitting: Orthopedic Surgery

## 2021-04-10 ENCOUNTER — Ambulatory Visit: Payer: Medicare Other

## 2021-04-10 ENCOUNTER — Other Ambulatory Visit: Payer: Self-pay

## 2021-04-10 ENCOUNTER — Encounter: Payer: Self-pay | Admitting: Orthopedic Surgery

## 2021-04-10 VITALS — Ht 63.0 in | Wt 101.0 lb

## 2021-04-10 DIAGNOSIS — N390 Urinary tract infection, site not specified: Secondary | ICD-10-CM | POA: Diagnosis not present

## 2021-04-10 DIAGNOSIS — S52532D Colles' fracture of left radius, subsequent encounter for closed fracture with routine healing: Secondary | ICD-10-CM | POA: Diagnosis not present

## 2021-04-10 DIAGNOSIS — B9620 Unspecified Escherichia coli [E. coli] as the cause of diseases classified elsewhere: Secondary | ICD-10-CM | POA: Diagnosis not present

## 2021-04-10 DIAGNOSIS — S62002D Unspecified fracture of navicular [scaphoid] bone of left wrist, subsequent encounter for fracture with routine healing: Secondary | ICD-10-CM

## 2021-04-10 DIAGNOSIS — G9341 Metabolic encephalopathy: Secondary | ICD-10-CM | POA: Diagnosis not present

## 2021-04-10 DIAGNOSIS — S52501D Unspecified fracture of the lower end of right radius, subsequent encounter for closed fracture with routine healing: Secondary | ICD-10-CM | POA: Diagnosis not present

## 2021-04-10 DIAGNOSIS — J439 Emphysema, unspecified: Secondary | ICD-10-CM | POA: Diagnosis not present

## 2021-04-10 DIAGNOSIS — I10 Essential (primary) hypertension: Secondary | ICD-10-CM | POA: Diagnosis not present

## 2021-04-10 NOTE — Patient Instructions (Signed)

## 2021-04-10 NOTE — Progress Notes (Signed)
New Patient Visit  Assessment: Denise Mendoza is a 87 y.o. female with the following: 1. Closed Colles' fracture of left radius with routine healing, subsequent encounter  Plan: Reviewed radiographs of the left wrist obtained in clinic today demonstrates dorsal angulation of the joint line.  However, on physical exam, she tolerates gentle range of motion.  There is no obvious deformity.  Overall, she appears to be healing in a less than ideal position, with reasonable function at this time.  Injury was sustained 3 weeks ago, we will continue with immobilization for an additional 2 weeks.  Likely transition to a removable wrist splint at the next visit.  Cast application - left short arm cast   Verbal consent was obtained and the correct extremity was identified. A well padded, appropriately molded short arm cast was applied to the left arm Fingers remained warm and well perfused.   There were no sharp edges Patient tolerated the procedure well Cast care instructions were provided    Follow-up: Return in about 2 weeks (around 04/24/2021).  Subjective:  Chief Complaint  Patient presents with   Fracture    Lt wrist DOI 03/22/21    History of Present Illness: Denise Mendoza is a 87 y.o. female who presents for repeat evaluation of a left wrist injury.  She sustained a fall, approximately 3 weeks ago.  She was seen in the emergency department, and subsequently admitted.  Since then, she has returned home.  She is tolerated the splint well.  She has had no issues.  The splint is remained in place.  Her pain is improved.  She has noted improved swelling in her fingers.  She is able to wiggle her fingers.  No numbness or tingling.   Review of Systems: No fevers or chills No numbness or tingling No chest pain No shortness of breath No bowel or bladder dysfunction No GI distress No headaches   Medical History:  Past Medical History:  Diagnosis Date   Hyperlipidemia     Hypertension    Lower back pain Oct 2013   Occlusive disease, arterial    Peripheral vascular disease [facility])    Stress Oct. 2013   Dr. Ignatius Specking    Past Surgical History:  Procedure Laterality Date   ABDOMINAL HYSTERECTOMY     BACK SURGERY     HIP PINNING,CANNULATED Right 01/03/2020   Procedure: OPEN TREATMENT INTERNAL FIXATION RIGHT HIP;  Surgeon: Vickki Hearing, MD;  Location: [facility];  Service: Orthopedics;  Laterality: Right;   NOSE SURGERY     TUMOR REMOVED FROM FINGER      Family History  Problem Relation Age of Onset   Heart disease Mother    Other Father        MACULARDEGENERATION   Social History   Tobacco Use   Smoking status: Some Days    Packs/day: 0.50    Years: 30.00    Pack years: 15.00    Types: Cigarettes   Smokeless tobacco: Never  Substance Use Topics   Alcohol use: No   Drug use: No    No Known Allergies  No outpatient medications have been marked as taking for the 04/10/21 encounter (Office Visit) with Oliver Barre, MD.    Objective: Ht 5\' 3"  (1.6 m)   Wt 101 lb (45.8 kg)   BMI 17.89 kg/m   Physical Exam:  General: Elderly female. and No acute distress. Gait: Ambulates with the assistance of a cane.  Evaluation of the  left wrist demonstrates no obvious deformity.  Minimal swelling is appreciated.  No swelling to her fingers.  There is some bruising, and senile purpura over the forearm.  There is a small scab on the radial forearm that is healthy in appearance.  She tolerates gentle range of motion without discomfort.  Fingers are warm and well-perfused.  Sensation is intact to the left hand.    IMAGING: I personally ordered and reviewed the following images  X-rays of the left wrist were obtained in clinic today.  There has been interval subsidence of the fracture.  The distal radius is now in a dorsal alignment.  Apex is volar.  No additional injuries are noted.  No obvious callus formation at the fracture site.  Impression:  Left distal radius fracture, with dorsal angulation of the joint line.  New Medications:  No orders of the defined types were placed in this encounter.     Oliver Barre, MD  04/10/2021 10:37 PM

## 2021-04-11 DIAGNOSIS — J439 Emphysema, unspecified: Secondary | ICD-10-CM | POA: Diagnosis not present

## 2021-04-11 DIAGNOSIS — N390 Urinary tract infection, site not specified: Secondary | ICD-10-CM | POA: Diagnosis not present

## 2021-04-11 DIAGNOSIS — B9620 Unspecified Escherichia coli [E. coli] as the cause of diseases classified elsewhere: Secondary | ICD-10-CM | POA: Diagnosis not present

## 2021-04-11 DIAGNOSIS — S52501D Unspecified fracture of the lower end of right radius, subsequent encounter for closed fracture with routine healing: Secondary | ICD-10-CM | POA: Diagnosis not present

## 2021-04-11 DIAGNOSIS — I10 Essential (primary) hypertension: Secondary | ICD-10-CM | POA: Diagnosis not present

## 2021-04-11 DIAGNOSIS — G9341 Metabolic encephalopathy: Secondary | ICD-10-CM | POA: Diagnosis not present

## 2021-04-12 DIAGNOSIS — G9341 Metabolic encephalopathy: Secondary | ICD-10-CM | POA: Diagnosis not present

## 2021-04-12 DIAGNOSIS — N390 Urinary tract infection, site not specified: Secondary | ICD-10-CM | POA: Diagnosis not present

## 2021-04-12 DIAGNOSIS — S52501D Unspecified fracture of the lower end of right radius, subsequent encounter for closed fracture with routine healing: Secondary | ICD-10-CM | POA: Diagnosis not present

## 2021-04-12 DIAGNOSIS — J439 Emphysema, unspecified: Secondary | ICD-10-CM | POA: Diagnosis not present

## 2021-04-12 DIAGNOSIS — B9620 Unspecified Escherichia coli [E. coli] as the cause of diseases classified elsewhere: Secondary | ICD-10-CM | POA: Diagnosis not present

## 2021-04-12 DIAGNOSIS — I10 Essential (primary) hypertension: Secondary | ICD-10-CM | POA: Diagnosis not present

## 2021-04-13 ENCOUNTER — Other Ambulatory Visit: Payer: Self-pay | Admitting: *Deleted

## 2021-04-13 DIAGNOSIS — I10 Essential (primary) hypertension: Secondary | ICD-10-CM | POA: Diagnosis not present

## 2021-04-13 DIAGNOSIS — J439 Emphysema, unspecified: Secondary | ICD-10-CM | POA: Diagnosis not present

## 2021-04-13 DIAGNOSIS — G9341 Metabolic encephalopathy: Secondary | ICD-10-CM | POA: Diagnosis not present

## 2021-04-13 DIAGNOSIS — S52501D Unspecified fracture of the lower end of right radius, subsequent encounter for closed fracture with routine healing: Secondary | ICD-10-CM | POA: Diagnosis not present

## 2021-04-13 DIAGNOSIS — B9620 Unspecified Escherichia coli [E. coli] as the cause of diseases classified elsewhere: Secondary | ICD-10-CM | POA: Diagnosis not present

## 2021-04-13 DIAGNOSIS — N390 Urinary tract infection, site not specified: Secondary | ICD-10-CM | POA: Diagnosis not present

## 2021-04-13 NOTE — Patient Outreach (Signed)
[facility]) Care Management  04/13/2021  JESSE HIRST [birth year] [id number]   [employer] outreach to post hospital referred patient    Mrs ADISEN BENNION was referred to [facility] on 03/27/21 for post hospital follow up  Insurance Medicare and blue cross and blue shield supplement  Transition of care services noted to be completed by primary care MD office staff Dr Delma Freeze internal medicine Transition of Care will be completed by primary care provider office who will refer to [facility] care management if needed.  Spoke with son Francee Piccolo when outreached to the listed preferred number in EPIC He referred RN CM to pt's home number He reports pt is doing better and has seen orthopedic for her left wrist fracture and will follow up in 2 weeks to check on "a shift" The cast was removed   Outreach to patient and she is doing well She is able to verify her HIPAA identifiers  She states she is improving  Her primary concerns this outreach are related to her noted frequent falls "every three to three and a half months because of my aneurysm"  She is able to confirm she has had 3 falls this year She inquires of the process and assistance with facility placement from the community She report she spoke with a SW about 6-9 months ago and she did not qualify for Medicaid at that time "missed it by forty dollars" She voices concern with having someone to be with her who can assist if she falls  She reports living alone since the passing of her spouse but does have her sister who lives in her neighborhood but is not always available. Her son, Francee Piccolo lives in another town but is supportive when needed She is legally blind RN CM answered various questions and provided education on FL2 completion, 21 day stay, difference in placement from community vs hospital levels of care She gives permission for RN CM to outreach to pcp prn for assist She was encouraged to inquire of pcp and pcp staff about  additional services of [facility] SW as she reports she has [facility]  She states she does not feel she would be able to tolerate a inpatient nor facility strenuous PT program   Patient Active Problem List   Diagnosis Date Noted   Fall    Acute metabolic encephalopathy 62/08/5595   Left wrist fracture, closed, initial encounter 03/22/2021   Left wrist fracture 03/22/2021   Pneumonia due to COVID-19 virus 01/17/2021   Acute cystitis without hematuria    Pulmonary emphysema ([place])    Closed displaced fracture of right femoral neck with routine healing 01/01/2020   Hypokalemia 01/01/2020   Dehydration 01/01/2020   Respiratory failure with hypoxia ([place]) 01/01/2020   Vitamin B12 deficiency 01/01/2020   Thrombocytopenia ([place]) 01/01/2020   Leukocytosis 01/01/2020   Acute lower UTI 01/01/2020   Essential hypertension 01/01/2020   Hyperlipidemia 01/01/2020   Altered mental status 12/31/2019   Bilateral leg pain 05/12/2012   Hip pain 05/12/2012     Care Plan : RN Care Manager Plan of Care  Updates made by Barbaraann Faster, RN since 04/13/2021 12:00 AM     Problem: Complex Care Coordination Needs and disease management in patient with HTN, aneurysm   Priority: High     Long-Range Goal: Establish Plan of Care for Management Complex SDOH Barriers, disease management and Care Coordination Needs in patient with HTN, aneurysm, falls   Start Date: 04/13/2021  This Visit's  Progress: Not on track  Priority: High  Note:   Current Barriers:  Knowledge Deficits related to plan of care for management of HTN and falls, aneurysm Care Coordination needs related to Level of care concerns  RN CM Clinical Goal(s):  Patient will work with pcp, pcp RN CM staff  to determine and coordinate preferred level of care  through collaboration with RN Care manager, provider, and care team.   Interventions: Follow up and outreach to pcp office staff and patient prn Inter-disciplinary care team collaboration (see  longitudinal plan of care) Evaluation of current treatment plan related to  self management and patient's adherence to plan as established by provider  Hypertension Interventions: Last practice recorded BP readings:  BP Readings from Last 3 Encounters:  03/26/21 (!) 115/59  01/20/21 (!) 148/63  01/06/20 (!) 150/65  Most recent eGFR/CrCl: No results found for: EGFR  No components found for: CRCL  Discussed plans with patient for ongoing care management follow up and provided patient with direct contact information for care management team;  Falls Interventions: Assessed for falls since last encounter; Advised patient to discuss level of care concerns, interest in [facility] SW referral with provider; Assessed social determinant of health barriers;   Patient Goals/Self-Care Activities: Patient will attend all scheduled provider appointments Patient will call provider office for new concerns or questions  Follow Up Plan:  The care management team will reach out to the patient again over the next 30 business days.      Emile Ringgenberg L. Lavina Hamman, RN, BSN, [place] Coordinator Office number [phone] Main [facility] number [phone] Fax number [phone]  Bartholomew. Lavina Hamman, RN, BSN, [place] Coordinator Office number [phone] Main [facility] number [phone] Fax number [phone]

## 2021-04-17 DIAGNOSIS — B9620 Unspecified Escherichia coli [E. coli] as the cause of diseases classified elsewhere: Secondary | ICD-10-CM | POA: Diagnosis not present

## 2021-04-17 DIAGNOSIS — G9341 Metabolic encephalopathy: Secondary | ICD-10-CM | POA: Diagnosis not present

## 2021-04-17 DIAGNOSIS — I10 Essential (primary) hypertension: Secondary | ICD-10-CM | POA: Diagnosis not present

## 2021-04-17 DIAGNOSIS — S52501D Unspecified fracture of the lower end of right radius, subsequent encounter for closed fracture with routine healing: Secondary | ICD-10-CM | POA: Diagnosis not present

## 2021-04-17 DIAGNOSIS — J439 Emphysema, unspecified: Secondary | ICD-10-CM | POA: Diagnosis not present

## 2021-04-17 DIAGNOSIS — N390 Urinary tract infection, site not specified: Secondary | ICD-10-CM | POA: Diagnosis not present

## 2021-04-18 DIAGNOSIS — N390 Urinary tract infection, site not specified: Secondary | ICD-10-CM | POA: Diagnosis not present

## 2021-04-18 DIAGNOSIS — S52501D Unspecified fracture of the lower end of right radius, subsequent encounter for closed fracture with routine healing: Secondary | ICD-10-CM | POA: Diagnosis not present

## 2021-04-18 DIAGNOSIS — B9620 Unspecified Escherichia coli [E. coli] as the cause of diseases classified elsewhere: Secondary | ICD-10-CM | POA: Diagnosis not present

## 2021-04-18 DIAGNOSIS — G9341 Metabolic encephalopathy: Secondary | ICD-10-CM | POA: Diagnosis not present

## 2021-04-18 DIAGNOSIS — J439 Emphysema, unspecified: Secondary | ICD-10-CM | POA: Diagnosis not present

## 2021-04-18 DIAGNOSIS — I10 Essential (primary) hypertension: Secondary | ICD-10-CM | POA: Diagnosis not present

## 2021-04-19 DIAGNOSIS — I10 Essential (primary) hypertension: Secondary | ICD-10-CM | POA: Diagnosis not present

## 2021-04-19 DIAGNOSIS — G9341 Metabolic encephalopathy: Secondary | ICD-10-CM | POA: Diagnosis not present

## 2021-04-19 DIAGNOSIS — N390 Urinary tract infection, site not specified: Secondary | ICD-10-CM | POA: Diagnosis not present

## 2021-04-19 DIAGNOSIS — S52501D Unspecified fracture of the lower end of right radius, subsequent encounter for closed fracture with routine healing: Secondary | ICD-10-CM | POA: Diagnosis not present

## 2021-04-19 DIAGNOSIS — J439 Emphysema, unspecified: Secondary | ICD-10-CM | POA: Diagnosis not present

## 2021-04-19 DIAGNOSIS — B9620 Unspecified Escherichia coli [E. coli] as the cause of diseases classified elsewhere: Secondary | ICD-10-CM | POA: Diagnosis not present

## 2021-04-20 DIAGNOSIS — I10 Essential (primary) hypertension: Secondary | ICD-10-CM | POA: Diagnosis not present

## 2021-04-20 DIAGNOSIS — B9620 Unspecified Escherichia coli [E. coli] as the cause of diseases classified elsewhere: Secondary | ICD-10-CM | POA: Diagnosis not present

## 2021-04-20 DIAGNOSIS — N390 Urinary tract infection, site not specified: Secondary | ICD-10-CM | POA: Diagnosis not present

## 2021-04-20 DIAGNOSIS — J439 Emphysema, unspecified: Secondary | ICD-10-CM | POA: Diagnosis not present

## 2021-04-20 DIAGNOSIS — S52501D Unspecified fracture of the lower end of right radius, subsequent encounter for closed fracture with routine healing: Secondary | ICD-10-CM | POA: Diagnosis not present

## 2021-04-20 DIAGNOSIS — G9341 Metabolic encephalopathy: Secondary | ICD-10-CM | POA: Diagnosis not present

## 2021-04-21 DIAGNOSIS — B9620 Unspecified Escherichia coli [E. coli] as the cause of diseases classified elsewhere: Secondary | ICD-10-CM | POA: Diagnosis not present

## 2021-04-21 DIAGNOSIS — I10 Essential (primary) hypertension: Secondary | ICD-10-CM | POA: Diagnosis not present

## 2021-04-21 DIAGNOSIS — S52501D Unspecified fracture of the lower end of right radius, subsequent encounter for closed fracture with routine healing: Secondary | ICD-10-CM | POA: Diagnosis not present

## 2021-04-21 DIAGNOSIS — N390 Urinary tract infection, site not specified: Secondary | ICD-10-CM | POA: Diagnosis not present

## 2021-04-21 DIAGNOSIS — J439 Emphysema, unspecified: Secondary | ICD-10-CM | POA: Diagnosis not present

## 2021-04-21 DIAGNOSIS — G9341 Metabolic encephalopathy: Secondary | ICD-10-CM | POA: Diagnosis not present

## 2021-04-23 ENCOUNTER — Encounter (HOSPITAL_COMMUNITY): Payer: Self-pay | Admitting: Radiology

## 2021-04-23 DIAGNOSIS — S52501D Unspecified fracture of the lower end of right radius, subsequent encounter for closed fracture with routine healing: Secondary | ICD-10-CM | POA: Diagnosis not present

## 2021-04-23 DIAGNOSIS — N390 Urinary tract infection, site not specified: Secondary | ICD-10-CM | POA: Diagnosis not present

## 2021-04-23 DIAGNOSIS — B9620 Unspecified Escherichia coli [E. coli] as the cause of diseases classified elsewhere: Secondary | ICD-10-CM | POA: Diagnosis not present

## 2021-04-23 DIAGNOSIS — J439 Emphysema, unspecified: Secondary | ICD-10-CM | POA: Diagnosis not present

## 2021-04-23 DIAGNOSIS — I10 Essential (primary) hypertension: Secondary | ICD-10-CM | POA: Diagnosis not present

## 2021-04-23 DIAGNOSIS — G9341 Metabolic encephalopathy: Secondary | ICD-10-CM | POA: Diagnosis not present

## 2021-04-24 ENCOUNTER — Other Ambulatory Visit: Payer: Self-pay

## 2021-04-24 ENCOUNTER — Ambulatory Visit (INDEPENDENT_AMBULATORY_CARE_PROVIDER_SITE_OTHER): Payer: Medicare Other | Admitting: Orthopedic Surgery

## 2021-04-24 ENCOUNTER — Other Ambulatory Visit: Payer: Self-pay | Admitting: *Deleted

## 2021-04-24 ENCOUNTER — Encounter: Payer: Self-pay | Admitting: Orthopedic Surgery

## 2021-04-24 ENCOUNTER — Ambulatory Visit: Payer: Medicare Other

## 2021-04-24 VITALS — Ht 63.0 in | Wt 101.0 lb

## 2021-04-24 DIAGNOSIS — S52532D Colles' fracture of left radius, subsequent encounter for closed fracture with routine healing: Secondary | ICD-10-CM

## 2021-04-24 NOTE — Patient Outreach (Signed)
[facility]) Care Management Telephonic RN Care Manager Note   06/02/2021 Name:  Denise Mendoza MRN:  [id number] DOB:  [birth year]  Summary: Follow up outreach to post hospital patient after her orthopedic appointment today She and her orthopedic MD note indicates it went well  She denies other worsening symptoms when assessed for needs She confirms with RN CM she is getting better and will be released from her home health therapies soon and therefore has not outreached to her pcp office about additional needed of [facility] SW services for facility placement discussed by her on 04/05/21 Reviewed with patient again of the pending new nurse to outreach with her from her primary care provider office soon  She voiced understanding  Subjective: Denise Mendoza is an 87 y.o. year old female who is a primary patient of Vyas, Dhruv B, MD. The care management team was consulted for assistance with care management and/or care coordination needs.    Telephonic RN Care Manager completed Telephone Visit today.   Objective:  Medications Reviewed Today     Reviewed by Mordecai Rasmussen, MD (Physician) on 04/24/21 at 1312  Med List Status: <None>   Medication Order Taking? Sig Documenting Provider Last Dose Status Informant  acetaminophen (TYLENOL) 325 MG tablet [id number] Yes Take 2 tablets (650 mg total) by mouth every 6 (six) hours as needed for mild pain or headache (or Fever >/= 101). Barton Dubois, MD Taking Active   ALPRAZolam Duanne Moron) 0.5 MG tablet [id number] Yes Take 1 mg by mouth 2 (two) times daily. [provider] Taking Active Self           Med Note Evalina Field Mar 22, 2021  2:04 PM)    amitriptyline (ELAVIL) 10 MG tablet [id number] Yes TAKE ONE TABLET BY MOUTH AT BEDTIME Carole Civil, MD Taking Active Self           Med Note Clydell Hakim   Thu Mar 22, 2021  2:04 PM)    aspirin EC 81 MG EC tablet [id number] Yes Take 1 tablet (81 mg total) by  mouth daily with breakfast. Swallow whole. Barton Dubois, MD Taking Active   guaiFENesin-dextromethorphan [facility] DM) 100-10 MG/5ML syrup [id number] Yes Take 10 mLs by mouth every 8 (eight) hours. Deatra James, MD Taking Active Self  Ipratropium-Albuterol (COMBIVENT) 20-100 MCG/ACT AERS respimat [id number] Yes Inhale 1 puff into the lungs every 6 (six) hours as needed for wheezing or shortness of breath. Deatra James, MD Taking Active Self  metoprolol succinate (TOPROL-XL) 50 MG 24 hr tablet [id number] Yes Take 1 tablet (50 mg total) by mouth daily. Take with or immediately following a meal. Barton Dubois, MD Taking Active   simvastatin (ZOCOR) 40 MG tablet [id number]  Take 1 tablet (40 mg total) by mouth every evening. Deatra James, MD  Expired 03/22/21 2359 Self           Patient Active Problem List   Diagnosis Date Noted   Fall    Acute metabolic encephalopathy 87/86/7672   Left wrist fracture, closed, initial encounter 03/22/2021   Left wrist fracture 03/22/2021   Pneumonia due to COVID-19 virus 01/17/2021   Acute cystitis without hematuria    Pulmonary emphysema ([place])    Closed displaced fracture of right femoral neck with routine healing 01/01/2020   Hypokalemia 01/01/2020   Dehydration 01/01/2020   Respiratory failure with hypoxia ([place]) 01/01/2020   Vitamin B12 deficiency 01/01/2020   Thrombocytopenia ([place])  01/01/2020   Leukocytosis 01/01/2020   Acute lower UTI 01/01/2020   Essential hypertension 01/01/2020   Hyperlipidemia 01/01/2020   Altered mental status 12/31/2019   Bilateral leg pain 05/12/2012   Hip pain 05/12/2012     SDOH:  (Social Determinants of Health) assessments and interventions performed:    Care Plan  Review of patient past medical history, allergies, medications, health status, including review of consultants reports, laboratory and other test data, was performed as part of comprehensive evaluation for care management services.    Care Plan : RN Care Manager Plan of Care  Updates made by Barbaraann Faster, RN since 06/02/2021 12:00 AM  Completed 06/02/2021   Problem: Complex Care Coordination Needs and disease management in patient with HTN, aneurysm Resolved 06/02/2021  Priority: High     Long-Range Goal: Establish Plan of Care for Management Complex SDOH Barriers, disease management and Care Coordination Needs in patient with HTN, aneurysm, falls Completed 04/24/2021  Start Date: 04/13/2021  Expected End Date: 04/24/2021  This Visit's Progress: On track  Recent Progress: Not on track  Priority: High  Note:   Current Barriers:  Knowledge Deficits related to plan of care for management of HTN and falls, aneurysm Care Coordination needs related to Level of care concerns  RN CM Clinical Goal(s):  Patient will work with pcp, pcp RN CM staff  to determine and coordinate preferred level of care  through collaboration with RN Care manager, provider, and care team.   Interventions: Follow up and outreach to pcp office staff and patient prn Inter-disciplinary care team collaboration (see longitudinal plan of care) Evaluation of current treatment plan related to  self management and patient's adherence to plan as established by provider  Hypertension Interventions: Last practice recorded BP readings:  BP Readings from Last 3 Encounters:  04/30/21 (!) 155/80  03/26/21 (!) 115/59  01/20/21 (!) 148/63  Most recent eGFR/CrCl: No results found for: EGFR  No components found for: CRCL  Discussed plans with patient for ongoing care management follow up and provided patient with direct contact information for care management team;  Falls Interventions: Assessed for falls since last encounter; Advised patient to discuss level of care concerns, interest in [facility] SW referral with provider; Assessed social determinant of health barriers;   Patient Goals/Self-Care Activities: Patient will attend all scheduled provider  appointments Patient will call provider office for new concerns or questions  Follow Up Plan:   Case closure on 04/24/21 as patient will be receiving an outreach soon from a nurse from her primary care office external care management program Letters to patient and pcp       Plan:  case closure as patient's pcp office will be having a new nurse outreach for external care management services soon Pt voiced appreciation of RN CM outreach to her  [place]. Lavina Hamman, RN, BSN, [place] Coordinator Office number [phone] [facility] number [phone] Fax number [phone]

## 2021-04-24 NOTE — Patient Instructions (Signed)
Wrist Fracture Rehab Ask your health care provider which exercises are safe for you. Do exercises exactly as told by your health care provider and adjust them as directed. It is normal to feel mild stretching, pulling, tightness, or discomfort as you do these exercises. Stop right away if you feel sudden pain or your pain gets worse. Do not begin these exercises until told by your health care provider. Stretching and range-of-motion exercises These exercises warm up your muscles and joints and improve the movement and flexibility of your wrist and hand. These exercises also help to relieve pain,numbness, and tingling. Finger flexion and extension Sit or stand with your elbow at your side. Open and stretch your left / right fingers as wide as you can (extension). Hold this position for 10 seconds. Close your left / right fingers into a gentle fist (flexion). Hold this position for 10 seconds. Slowly return to the starting position. Repeat 10 times. Complete this exercise 1-2 times a day. Wrist flexion Bend your left / right elbow to a 90-degree angle (right angle) with your palm facing the floor. Bend your wrist forward so your fingers point toward the floor (flexion). Hold this position for 10 seconds. Slowly return to the starting position. Repeat 10 times. Complete this exercise 1-2 times a day. Wrist extension Bend your left / right elbow to a 90-degree angle (right angle) with your palm facing the floor. Bend your wrist backward so your fingers point toward the ceiling (extension). Hold this position for 10 seconds. Slowly return to the starting position. Repeat 10 times. Complete this exercise 1-2 times a day. Ulnar deviation Bend your left / right elbow to a 90-degree angle (right angle), and rest your forearm on a table with your palm facing down. Keeping your hand flat on the table, bend your left / right wrist toward your small finger (pinkie). This is ulnar deviation. Hold this  position for 10 seconds. Slowly return to the starting position. Repeat 10 times. Complete this exercise 1-2 times a day. Radial deviation Bend your left / right elbow to a 90-degree angle (right angle), and rest your forearm on a table with your palm facing down. Keeping your hand flat on the table, bend your left / right wrist toward your thumb. This is radial deviation. Hold this position for 10 seconds. Slowly return to the starting position. Repeat 10 times. Complete this exercise 1-2 times a day. Forearm rotation, supination Stand or sit with your left / right elbow bent to a 90-degree angle (right angle) at your side. Position your forearm so that the thumb is facing the ceiling (neutral position). Turn (rotate) your palm up toward the ceiling (supination), stopping when you feel a gentle stretch. Hold this position for 10 seconds. Slowly return to the starting position. Repeat 10 times. Complete this exercise 1-2 times a day. Forearm rotation, pronation Stand or sit with your left / right elbow bent to a 90-degree angle (right angle) at your side. Position your forearm so that the thumb is facing the ceiling (neutral position). Turn (rotate) your palm down toward the floor (pronation), stopping when you feel a gentle stretch. Hold this position for 10 seconds. Slowly return to the starting position. Repeat 10 times. Complete this exercise 1-2 times a day. Wrist flexion stretch  Extend your left / right arm in front of you and turn your palm down toward the floor. If told by your health care provider, bend your left / right arm to a 90-degree angle (  right angle) at your side. Using your uninjured hand, gently press over the back of your left / right hand to bend your wrist and fingers toward the floor (flexion). Go as far as you can to feel a stretch without causing pain. Hold this position for 10 seconds. Slowly return to the starting position. Repeat 10 times. Complete this  exercise 1-2 times a day. Wrist extension stretch  Extend your left / right arm in front of you and turn your palm up toward the ceiling. If told by your health care provider, bend your left / right arm to a 90-degree angle (right angle) at your side. Using your uninjured hand, gently press over the palm of your left / right hand to bend your wrist and fingers toward the floor (extension). Go as far as you can to feel a stretch without causing pain. Hold this position for 10 seconds. Slowly return to the starting position. Repeat 10 times. Complete this exercise 1-2 times a day. Forearm rotation stretch, supination Stand or sit with your arms at your sides. Bend your left / right elbow to a 90-degree angle (right angle). Using your uninjured hand, turn your left / right palm up toward the ceiling (assisted supination) until you feel a gentle stretch in the inside of your forearm. Hold this position for 10 seconds. Slowly return to the starting position. Repeat 10 times. Complete this exercise 1-2 times a day. Forearm rotation stretch, pronation Stand or sit with your arms at your sides. Bend your left / right elbow to a 90-degree angle (right angle). Using your uninjured hand, turn your left / right palm down toward the floor (assisted pronation) until you feel a gentle stretch in the top of your forearm. Hold this position for 10 seconds. Slowly return to the starting position. Repeat 10 times. Complete this exercise 1-2 times a day. Strengthening exercises These exercises build strength and endurance in your wrist and hand. Enduranceis the ability to use your muscles for a long time, even after they get tired. Wrist flexion Sit with your left / right forearm supported on a table. Your elbow should be at waist height. Rest your hand over the edge of the table, palm up. Gently grasp a 5 lb / kg weight (can of soup). Or, hold an exercise band or tube in both hands, keeping your hands at  the same level and hip distance apart. There should be slight tension in the exercise band or tube. Without moving your forearm or elbow, slowly bend your wrist up toward the ceiling (wrist flexion). Hold this position for 10 seconds. Slowly return to the starting position. Repeat 10 times. Complete this exercise 1-2 times a day. Wrist extension Sit with your left / right forearm supported on a table. Your elbow should be at waist height. Rest your hand over the edge of the table, palm down. Gently grasp a 5 lb / kg weight. Or, hold an exercise band or tube in both hands, keeping your hands at the same level and hip distance apart. There should be slight tension in the exercise band or tube. Without moving your forearm or elbow, slowly curl your hand up toward the ceiling (extension). Hold this position for 10 seconds. Slowly return to the starting position. Repeat 10 times. Complete this exercise 1-2 times a day. Forearm rotation, supination  Sit with your left / right forearm supported on a table. Your elbow should be at waist height. Rest your hand over the edge of the   table, palm down. Gently grasp a lightweight hammer near the head. As this exercise gets easier for you, try holding the hammer farther down the handle. Without moving your elbow, slowly turn (rotate) your palm up toward the ceiling (supination). Hold this position for 10 seconds. Slowly return to the starting position. Repeat 10 times. Complete this exercise 1-2 times a day. Forearm rotation, pronation  Sit with your left / right forearm supported on a table. Your elbow should be at waist height. Rest your hand over the edge of the table, palm up. Gently grasp a lightweight hammer near the head. As this exercise gets easier for you, try holding the hammer farther down the handle. Without moving your elbow, slowly turn (rotate) your palm down toward the floor (pronation). Hold this position for 10 seconds. Slowly return  to the starting position. Repeat 10 times. Complete this exercise 1-2 times a day. Grip strengthening  Hold one of these items in your left / right hand: a dense sponge, a stress ball, or a large, rolled sock. Slowly squeeze the object as hard as you can without increasing any pain. Hold your squeeze for 10 seconds. Slowly release your grip. Repeat 10 times. Complete this exercise 1-2 times a day. This information is not intended to replace advice given to you by your health care provider. Make sure you discuss any questions you have with your healthcare provider. Document Revised: 09/30/2019 Document Reviewed: 09/30/2019 Elsevier Patient Education  2022 [employer] Inc.  

## 2021-04-24 NOTE — Progress Notes (Signed)
New Patient Visit  Assessment: Denise Mendoza is a 87 y.o. female with the following: 1. Closed Colles' fracture of left radius with routine healing, subsequent encounter  Plan: Radiographs were reviewed with the patient in clinic today.  There has been no interval displacement of the fracture.  She has lost the radial height, and there is dorsal angulation of the joint line.  However, she has pretty good range of motion at this point in time.  She is not interested in discussions regarding surgery.  I do not think that the current deformity would warrant an intervention, and she is in agreement.  I recommended transition to a removable wrist brace in clinic today, and I have asked her to wear this for the next 2 weeks.  She can initiate range of motion activities immediately.  I provided her with some basic exercises for stretching and strengthening.  She will contact the clinic if she has any further issues.  No follow-up is needed at this time.    Follow-up: Return if symptoms worsen or fail to improve.  Subjective:  Chief Complaint  Patient presents with   fracture care    Follow up DOI 10/20/2 Closed colles' fracture left radius    History of Present Illness: Denise Mendoza is a 87 y.o. female who presents for repeat evaluation of a left wrist injury.  She sustained a left distal radius fracture approximately 5 weeks ago.  She has been immobilized ever since.  She states her pain is improving.  She has no issues at this time.  No numbness or tingling.  Review of Systems: No fevers or chills No numbness or tingling No chest pain No shortness of breath No bowel or bladder dysfunction No GI distress No headaches   Objective: Ht 5\' 3"  (1.6 m)   Wt 101 lb (45.8 kg)   BMI 17.89 kg/m   Physical Exam:  General: Elderly female. and No acute distress. Gait: Ambulates with the assistance of a cane.  Evaluation left wrist demonstrates no swelling.  No bruising is  appreciated.  Mild deformity is appreciated related to the fracture.  She tolerates approximately 30 degrees of extension, 20 degrees of flexion.  Minimal pronation and supination.  Fingers are warm and well-perfused.  She has some deformities of her fingers.  She is unable to completely flex the long finger.  Near complete fist.  2+ radial pulse.    IMAGING: I personally ordered and reviewed the following images  Radiographs of the left wrist were obtained in clinic today, compared to previous x-rays.  There is been no interval displacement, compared to the most recent x-rays.  She has dorsal angulation of the joint line.  There is loss of radial height.  Obvious comminution of the fracture.  Some callus formation is appreciated.  Impression: Left distal radius fracture, with mild deformity, including dorsal angulation of the joint line.  Acceptable alignment overall.   New Medications:  No orders of the defined types were placed in this encounter.     , MD  04/24/2021 1:08 PM

## 2021-04-25 DIAGNOSIS — G9341 Metabolic encephalopathy: Secondary | ICD-10-CM | POA: Diagnosis not present

## 2021-04-25 DIAGNOSIS — I10 Essential (primary) hypertension: Secondary | ICD-10-CM | POA: Diagnosis not present

## 2021-04-25 DIAGNOSIS — J439 Emphysema, unspecified: Secondary | ICD-10-CM | POA: Diagnosis not present

## 2021-04-25 DIAGNOSIS — S52501D Unspecified fracture of the lower end of right radius, subsequent encounter for closed fracture with routine healing: Secondary | ICD-10-CM | POA: Diagnosis not present

## 2021-04-25 DIAGNOSIS — B9620 Unspecified Escherichia coli [E. coli] as the cause of diseases classified elsewhere: Secondary | ICD-10-CM | POA: Diagnosis not present

## 2021-04-25 DIAGNOSIS — N390 Urinary tract infection, site not specified: Secondary | ICD-10-CM | POA: Diagnosis not present

## 2021-04-26 DIAGNOSIS — E876 Hypokalemia: Secondary | ICD-10-CM | POA: Diagnosis not present

## 2021-04-26 DIAGNOSIS — D696 Thrombocytopenia, unspecified: Secondary | ICD-10-CM | POA: Diagnosis not present

## 2021-04-26 DIAGNOSIS — W19XXXD Unspecified fall, subsequent encounter: Secondary | ICD-10-CM | POA: Diagnosis not present

## 2021-04-26 DIAGNOSIS — G4700 Insomnia, unspecified: Secondary | ICD-10-CM | POA: Diagnosis not present

## 2021-04-26 DIAGNOSIS — Z8701 Personal history of pneumonia (recurrent): Secondary | ICD-10-CM | POA: Diagnosis not present

## 2021-04-26 DIAGNOSIS — G9341 Metabolic encephalopathy: Secondary | ICD-10-CM | POA: Diagnosis not present

## 2021-04-26 DIAGNOSIS — B9620 Unspecified Escherichia coli [E. coli] as the cause of diseases classified elsewhere: Secondary | ICD-10-CM | POA: Diagnosis not present

## 2021-04-26 DIAGNOSIS — I739 Peripheral vascular disease, unspecified: Secondary | ICD-10-CM | POA: Diagnosis not present

## 2021-04-26 DIAGNOSIS — I10 Essential (primary) hypertension: Secondary | ICD-10-CM | POA: Diagnosis not present

## 2021-04-26 DIAGNOSIS — F0390 Unspecified dementia without behavioral disturbance: Secondary | ICD-10-CM | POA: Diagnosis not present

## 2021-04-26 DIAGNOSIS — I712 Thoracic aortic aneurysm, without rupture, unspecified: Secondary | ICD-10-CM | POA: Diagnosis not present

## 2021-04-26 DIAGNOSIS — E785 Hyperlipidemia, unspecified: Secondary | ICD-10-CM | POA: Diagnosis not present

## 2021-04-26 DIAGNOSIS — S52501D Unspecified fracture of the lower end of right radius, subsequent encounter for closed fracture with routine healing: Secondary | ICD-10-CM | POA: Diagnosis not present

## 2021-04-26 DIAGNOSIS — F419 Anxiety disorder, unspecified: Secondary | ICD-10-CM | POA: Diagnosis not present

## 2021-04-26 DIAGNOSIS — Z79891 Long term (current) use of opiate analgesic: Secondary | ICD-10-CM | POA: Diagnosis not present

## 2021-04-26 DIAGNOSIS — N390 Urinary tract infection, site not specified: Secondary | ICD-10-CM | POA: Diagnosis not present

## 2021-04-26 DIAGNOSIS — J439 Emphysema, unspecified: Secondary | ICD-10-CM | POA: Diagnosis not present

## 2021-04-26 DIAGNOSIS — D649 Anemia, unspecified: Secondary | ICD-10-CM | POA: Diagnosis not present

## 2021-04-26 DIAGNOSIS — Z8616 Personal history of COVID-19: Secondary | ICD-10-CM | POA: Diagnosis not present

## 2021-04-26 DIAGNOSIS — Z9181 History of falling: Secondary | ICD-10-CM | POA: Diagnosis not present

## 2021-04-26 DIAGNOSIS — M549 Dorsalgia, unspecified: Secondary | ICD-10-CM | POA: Diagnosis not present

## 2021-04-26 DIAGNOSIS — Z7982 Long term (current) use of aspirin: Secondary | ICD-10-CM | POA: Diagnosis not present

## 2021-04-30 ENCOUNTER — Emergency Department (HOSPITAL_COMMUNITY): Payer: Medicare Other

## 2021-04-30 ENCOUNTER — Emergency Department (HOSPITAL_COMMUNITY)
Admission: EM | Admit: 2021-04-30 | Discharge: 2021-04-30 | Disposition: A | Discharge status: Home / Self Care | Payer: Medicare Other | Attending: Emergency Medicine | Admitting: Emergency Medicine

## 2021-04-30 ENCOUNTER — Other Ambulatory Visit: Payer: Self-pay

## 2021-04-30 ENCOUNTER — Encounter (HOSPITAL_COMMUNITY): Payer: Self-pay

## 2021-04-30 DIAGNOSIS — F17210 Nicotine dependence, cigarettes, uncomplicated: Secondary | ICD-10-CM | POA: Diagnosis not present

## 2021-04-30 DIAGNOSIS — M542 Cervicalgia: Secondary | ICD-10-CM | POA: Insufficient documentation

## 2021-04-30 DIAGNOSIS — R519 Headache, unspecified: Secondary | ICD-10-CM | POA: Insufficient documentation

## 2021-04-30 DIAGNOSIS — Z79899 Other long term (current) drug therapy: Secondary | ICD-10-CM | POA: Insufficient documentation

## 2021-04-30 DIAGNOSIS — I6523 Occlusion and stenosis of bilateral carotid arteries: Secondary | ICD-10-CM | POA: Diagnosis not present

## 2021-04-30 DIAGNOSIS — I10 Essential (primary) hypertension: Secondary | ICD-10-CM | POA: Diagnosis not present

## 2021-04-30 DIAGNOSIS — R69 Illness, unspecified: Secondary | ICD-10-CM | POA: Diagnosis not present

## 2021-04-30 DIAGNOSIS — Z8616 Personal history of COVID-19: Secondary | ICD-10-CM | POA: Diagnosis not present

## 2021-04-30 DIAGNOSIS — Z7982 Long term (current) use of aspirin: Secondary | ICD-10-CM | POA: Diagnosis not present

## 2021-04-30 DIAGNOSIS — M25539 Pain in unspecified wrist: Secondary | ICD-10-CM | POA: Diagnosis not present

## 2021-04-30 DIAGNOSIS — R5381 Other malaise: Secondary | ICD-10-CM | POA: Diagnosis not present

## 2021-04-30 MED ORDER — LORAZEPAM 0.5 MG PO TABS
0.5000 mg | ORAL_TABLET | Freq: Once | ORAL | Status: AC
  Administered 2021-04-30: 14:00:00 0.5 mg via ORAL
  Filled 2021-04-30: qty 1

## 2021-04-30 NOTE — ED Notes (Signed)
Dc instructions reviewed with pt. No questions or concerns at this time. Pt wheeled to lobby and son will transport pt home.

## 2021-04-30 NOTE — ED Provider Notes (Signed)
[facility] EMERGENCY DEPARTMENT Provider Note   CSN: [id number] Arrival date & time: 04/30/21  1002     History Chief Complaint  Patient presents with   Headache    Denise Mendoza is a 87 y.o. female.  The history is provided by the patient. No language interpreter was used.  Headache Pain location:  Generalized Radiates to:  L neck Onset quality:  Gradual Duration:  3 days Timing:  Constant Progression:  Worsening Similar to prior headaches: no   Relieved by:  Nothing Worsened by:  Nothing Ineffective treatments:  None tried Associated symptoms: no blurred vision, no congestion, no cough, no fever and no neck stiffness   Pt complains of soreness to the left side of her neck.  Pt reports she feels like she has a swollen area left side of neck.  Pt also complains of a left sided headache.  Pt reports she has had pain in the same area before but has not felt a knot.      Past Medical History:  Diagnosis Date   Hyperlipidemia    Hypertension    Lower back pain Oct 2013   Occlusive disease, arterial    Peripheral vascular disease ([place])    Stress Oct. 2013   Dr. Glenda Chroman    Patient Active Problem List   Diagnosis Date Noted   Fall    Acute metabolic encephalopathy [id number]   Left wrist fracture, closed, initial encounter 03/22/2021   Left wrist fracture 03/22/2021   Pneumonia due to COVID-19 virus 01/17/2021   Acute cystitis without hematuria    Pulmonary emphysema ([place])    Closed displaced fracture of right femoral neck with routine healing 01/01/2020   Hypokalemia 01/01/2020   Dehydration 01/01/2020   Respiratory failure with hypoxia ([place]) 01/01/2020   Vitamin B12 deficiency 01/01/2020   Thrombocytopenia ([place]) 01/01/2020   Leukocytosis 01/01/2020   Acute lower UTI 01/01/2020   Essential hypertension 01/01/2020   Hyperlipidemia 01/01/2020   Altered mental status 12/31/2019   Bilateral leg pain 05/12/2012   Hip pain 05/12/2012    Past Surgical  History:  Procedure Laterality Date   ABDOMINAL HYSTERECTOMY     BACK SURGERY     HIP PINNING,CANNULATED Right 01/03/2020   Procedure: OPEN TREATMENT INTERNAL FIXATION RIGHT HIP;  Surgeon: Carole Civil, MD;  Location: [facility];  Service: Orthopedics;  Laterality: Right;   NOSE SURGERY     TUMOR REMOVED FROM FINGER       OB History   No obstetric history on file.     Family History  Problem Relation Age of Onset   Heart disease Mother    Other Father        MACULARDEGENERATION    Social History   Tobacco Use   Smoking status: Some Days    Packs/day: 0.50    Years: 30.00    Pack years: 15.00    Types: Cigarettes   Smokeless tobacco: Never  Substance Use Topics   Alcohol use: No   Drug use: No    Home Medications Prior to Admission medications   Medication Sig Start Date End Date Taking? Authorizing Provider  acetaminophen (TYLENOL) 325 MG tablet Take 2 tablets (650 mg total) by mouth every 6 (six) hours as needed for mild pain or headache (or Fever >/= 101). 03/26/21   Barton Dubois, MD  ALPRAZolam Duanne Moron) 0.5 MG tablet Take 1 mg by mouth 2 (two) times daily.    [provider]  amitriptyline (ELAVIL) 10  MG tablet TAKE ONE TABLET BY MOUTH AT BEDTIME 01/04/21   Vickki Hearing, MD  aspirin EC 81 MG EC tablet Take 1 tablet (81 mg total) by mouth daily with breakfast. Swallow whole. 03/27/21   Vassie Loll, MD  guaiFENesin-dextromethorphan [facility] DM) 100-10 MG/5ML syrup Take 10 mLs by mouth every 8 (eight) hours. 01/20/21   Kendell Bane, MD  Ipratropium-Albuterol (COMBIVENT) 20-100 MCG/ACT AERS respimat Inhale 1 puff into the lungs every 6 (six) hours as needed for wheezing or shortness of breath. 01/20/21 04/24/21  Shahmehdi, Gemma Payor, MD  metoprolol succinate (TOPROL-XL) 50 MG 24 hr tablet Take 1 tablet (50 mg total) by mouth daily. Take with or immediately following a meal. 03/27/21   Vassie Loll, MD  simvastatin (ZOCOR) 40 MG tablet Take 1  tablet (40 mg total) by mouth every evening. 01/20/21 03/22/21  Kendell Bane, MD    Allergies    Patient has no known allergies.  Review of Systems   Review of Systems  Constitutional:  Negative for fever.  HENT:  Negative for congestion.   Eyes:  Negative for blurred vision.  Respiratory:  Negative for cough.   Musculoskeletal:  Negative for neck stiffness.  Neurological:  Positive for headaches.  All other systems reviewed and are negative.  Physical Exam Updated Vital Signs BP 139/70   Pulse 87   Temp 98.1 F (36.7 C) (Oral)   Resp (!) 25   Ht 5\' 3"  (1.6 m)   Wt 39.5 kg   SpO2 97%   BMI 15.41 kg/m   Physical Exam Vitals and nursing note reviewed.  Constitutional:      Appearance: She is well-developed.  HENT:     Head: Normocephalic.     Mouth/Throat:     Mouth: Mucous membranes are moist.  Eyes:     Extraocular Movements: Extraocular movements intact.     Pupils: Pupils are equal, round, and reactive to light.  Neck:     Comments: Tender left lateral neck,  no mass palpated,   Cardiovascular:     Rate and Rhythm: Normal rate and regular rhythm.     Heart sounds: Normal heart sounds.  Pulmonary:     Effort: Pulmonary effort is normal.  Abdominal:     General: There is no distension.  Musculoskeletal:        General: Tenderness present. No swelling. Normal range of motion.     Cervical back: Normal range of motion.  Skin:    General: Skin is warm.  Neurological:     Mental Status: She is alert and oriented to person, place, and time.     Cranial Nerves: No cranial nerve deficit.     Motor: No weakness.  Psychiatric:        Mood and Affect: Mood normal.    ED Results / Procedures / Treatments   Labs (all labs ordered are listed, but only abnormal results are displayed) Labs Reviewed - No data to display  EKG None  Radiology CT HEAD WO CONTRAST ( )  Result Date: 04/30/2021 CLINICAL DATA:  Headache, new or worsening (Age >= 50y); Neck  mass, initial workup EXAM: CT HEAD WITHOUT CONTRAST CT NECK WITHOUT CONTRAST TECHNIQUE: Contiguous axial images were obtained from the base of the skull through the vertex without contrast. Multidetector CT imaging of the neck was performed using the standard protocol without intravenous contrast. COMPARISON:  None. FINDINGS: CT HEAD FINDINGS Brain: There is no acute intracranial hemorrhage, mass effect or edema. Gray-white differentiation  is preserved. Patchy hypoattenuation in the supratentorial white matter is nonspecific but probably reflects mild to moderate chronic microvascular ischemic changes. Prominence of the ventricles and sulci reflects minor parenchymal volume loss. No extra-axial collection. Vascular: Mild intracranial atherosclerotic calcification at the skull base. Skull: Unremarkable. Sinuses/Orbits: No significant opacification. No significant orbital abnormality. CT NECK FINDINGS Pharynx and larynx: Unremarkable.  No mass or swelling. Salivary glands: Unremarkable. Thyroid: Unremarkable. Lymph nodes: No enlarged or abnormal density nodes. Vascular: Minimal calcified plaque at the common carotid bifurcations. Mastoids: Aerated. Skeleton: Osseous demineralization. Multilevel severe thoracic compression fractures. Cervical spine degenerative changes. Upper chest: Minimally imaged dilated aorta as noted on recent CT chest. Emphysema. IMPRESSION: No acute intracranial abnormality. Chronic microvascular ischemic changes. No neck mass or adenopathy. Emphysema. Multiple chronic severe thoracic compression fractures. Electronically Signed   By: Macy Mis M.D.   On: 04/30/2021 11:50   CT Soft Tissue Neck Wo Contrast  Result Date: 04/30/2021 CLINICAL DATA:  Headache, new or worsening (Age >= 50y); Neck mass, initial workup EXAM: CT HEAD WITHOUT CONTRAST CT NECK WITHOUT CONTRAST TECHNIQUE: Contiguous axial images were obtained from the base of the skull through the vertex without contrast.  Multidetector CT imaging of the neck was performed using the standard protocol without intravenous contrast. COMPARISON:  None. FINDINGS: CT HEAD FINDINGS Brain: There is no acute intracranial hemorrhage, mass effect or edema. Gray-white differentiation is preserved. Patchy hypoattenuation in the supratentorial white matter is nonspecific but probably reflects mild to moderate chronic microvascular ischemic changes. Prominence of the ventricles and sulci reflects minor parenchymal volume loss. No extra-axial collection. Vascular: Mild intracranial atherosclerotic calcification at the skull base. Skull: Unremarkable. Sinuses/Orbits: No significant opacification. No significant orbital abnormality. CT NECK FINDINGS Pharynx and larynx: Unremarkable.  No mass or swelling. Salivary glands: Unremarkable. Thyroid: Unremarkable. Lymph nodes: No enlarged or abnormal density nodes. Vascular: Minimal calcified plaque at the common carotid bifurcations. Mastoids: Aerated. Skeleton: Osseous demineralization. Multilevel severe thoracic compression fractures. Cervical spine degenerative changes. Upper chest: Minimally imaged dilated aorta as noted on recent CT chest. Emphysema. IMPRESSION: No acute intracranial abnormality. Chronic microvascular ischemic changes. No neck mass or adenopathy. Emphysema. Multiple chronic severe thoracic compression fractures. Electronically Signed   By: Macy Mis M.D.   On: 04/30/2021 11:50    Procedures Procedures   Medications Ordered in ED Medications  LORazepam (ATIVAN) tablet 0.5 mg (0.5 mg Oral Given 04/30/21 1406)    ED Course  I have reviewed the triage vital signs and the nursing notes.  Pertinent labs & imaging results that were available during my care of the patient were reviewed by me and considered in my medical decision making (see chart for details).    MDM Rules/Calculators/A&P                           MDM:  Ct head and ct soft tissue neck.  Ct head shows  chronic changes and Neck shows no sign of mass.  Pt request medication for anxiety.  Pt given ativan 0.5 here.  Pt noted to take xanax at home.   Final Clinical Impression(s) / ED Diagnoses Final diagnoses:  Nonintractable headache, unspecified chronicity pattern, unspecified headache type  Neck pain    Rx / DC Orders ED Discharge Orders     None     An After Visit Summary was printed and given to the patient.    Sidney Ace 04/30/21 1429    Davonna Belling, MD  05/01/21 0919  

## 2021-04-30 NOTE — ED Triage Notes (Signed)
Pt c/o head pressure that radiates down her left ear and left side of throat causing pressure feeling.

## 2021-04-30 NOTE — ED Notes (Signed)
Patient transported to CT 

## 2021-04-30 NOTE — ED Notes (Signed)
Pt provided with blanket. Requesting pain mediation and something for her nerves. MD made aware. Call bell in reach.

## 2021-04-30 NOTE — Discharge Instructions (Addendum)
Tylenol for headache and neck discomfort.  Continue home medications

## 2021-05-02 DIAGNOSIS — E7800 Pure hypercholesterolemia, unspecified: Secondary | ICD-10-CM | POA: Diagnosis not present

## 2021-05-02 DIAGNOSIS — F339 Major depressive disorder, recurrent, unspecified: Secondary | ICD-10-CM | POA: Diagnosis not present

## 2021-05-04 DIAGNOSIS — G9341 Metabolic encephalopathy: Secondary | ICD-10-CM | POA: Diagnosis not present

## 2021-05-04 DIAGNOSIS — N390 Urinary tract infection, site not specified: Secondary | ICD-10-CM | POA: Diagnosis not present

## 2021-05-04 DIAGNOSIS — S52501D Unspecified fracture of the lower end of right radius, subsequent encounter for closed fracture with routine healing: Secondary | ICD-10-CM | POA: Diagnosis not present

## 2021-05-04 DIAGNOSIS — B9620 Unspecified Escherichia coli [E. coli] as the cause of diseases classified elsewhere: Secondary | ICD-10-CM | POA: Diagnosis not present

## 2021-05-04 DIAGNOSIS — I10 Essential (primary) hypertension: Secondary | ICD-10-CM | POA: Diagnosis not present

## 2021-05-04 DIAGNOSIS — J439 Emphysema, unspecified: Secondary | ICD-10-CM | POA: Diagnosis not present

## 2021-05-09 DIAGNOSIS — Z79899 Other long term (current) drug therapy: Secondary | ICD-10-CM | POA: Diagnosis not present

## 2021-05-09 DIAGNOSIS — G8929 Other chronic pain: Secondary | ICD-10-CM | POA: Diagnosis not present

## 2021-05-09 DIAGNOSIS — M545 Low back pain, unspecified: Secondary | ICD-10-CM | POA: Diagnosis not present

## 2021-05-09 DIAGNOSIS — I10 Essential (primary) hypertension: Secondary | ICD-10-CM | POA: Diagnosis not present

## 2021-05-11 DIAGNOSIS — S52501D Unspecified fracture of the lower end of right radius, subsequent encounter for closed fracture with routine healing: Secondary | ICD-10-CM | POA: Diagnosis not present

## 2021-05-11 DIAGNOSIS — B9620 Unspecified Escherichia coli [E. coli] as the cause of diseases classified elsewhere: Secondary | ICD-10-CM | POA: Diagnosis not present

## 2021-05-11 DIAGNOSIS — J439 Emphysema, unspecified: Secondary | ICD-10-CM | POA: Diagnosis not present

## 2021-05-11 DIAGNOSIS — N390 Urinary tract infection, site not specified: Secondary | ICD-10-CM | POA: Diagnosis not present

## 2021-05-11 DIAGNOSIS — G9341 Metabolic encephalopathy: Secondary | ICD-10-CM | POA: Diagnosis not present

## 2021-05-11 DIAGNOSIS — I10 Essential (primary) hypertension: Secondary | ICD-10-CM | POA: Diagnosis not present

## 2021-05-14 DIAGNOSIS — N390 Urinary tract infection, site not specified: Secondary | ICD-10-CM | POA: Diagnosis not present

## 2021-05-14 DIAGNOSIS — I10 Essential (primary) hypertension: Secondary | ICD-10-CM | POA: Diagnosis not present

## 2021-05-14 DIAGNOSIS — B9620 Unspecified Escherichia coli [E. coli] as the cause of diseases classified elsewhere: Secondary | ICD-10-CM | POA: Diagnosis not present

## 2021-05-14 DIAGNOSIS — S52501D Unspecified fracture of the lower end of right radius, subsequent encounter for closed fracture with routine healing: Secondary | ICD-10-CM | POA: Diagnosis not present

## 2021-05-14 DIAGNOSIS — G9341 Metabolic encephalopathy: Secondary | ICD-10-CM | POA: Diagnosis not present

## 2021-05-14 DIAGNOSIS — J439 Emphysema, unspecified: Secondary | ICD-10-CM | POA: Diagnosis not present

## 2021-05-22 DIAGNOSIS — N390 Urinary tract infection, site not specified: Secondary | ICD-10-CM | POA: Diagnosis not present

## 2021-05-22 DIAGNOSIS — G9341 Metabolic encephalopathy: Secondary | ICD-10-CM | POA: Diagnosis not present

## 2021-05-22 DIAGNOSIS — I10 Essential (primary) hypertension: Secondary | ICD-10-CM | POA: Diagnosis not present

## 2021-05-22 DIAGNOSIS — B9620 Unspecified Escherichia coli [E. coli] as the cause of diseases classified elsewhere: Secondary | ICD-10-CM | POA: Diagnosis not present

## 2021-05-22 DIAGNOSIS — J439 Emphysema, unspecified: Secondary | ICD-10-CM | POA: Diagnosis not present

## 2021-05-22 DIAGNOSIS — S52501D Unspecified fracture of the lower end of right radius, subsequent encounter for closed fracture with routine healing: Secondary | ICD-10-CM | POA: Diagnosis not present

## 2021-09-26 ENCOUNTER — Encounter (HOSPITAL_COMMUNITY): Payer: Self-pay

## 2021-09-26 ENCOUNTER — Emergency Department (HOSPITAL_COMMUNITY): Payer: Medicare Other

## 2021-09-26 ENCOUNTER — Other Ambulatory Visit: Payer: Self-pay

## 2021-09-26 ENCOUNTER — Emergency Department (HOSPITAL_COMMUNITY)
Admission: EM | Admit: 2021-09-26 | Discharge: 2021-09-27 | Disposition: A | Discharge status: Skilled Nursing Facility | Payer: Medicare Other | Attending: Emergency Medicine | Admitting: Emergency Medicine

## 2021-09-26 DIAGNOSIS — S6291XA Unspecified fracture of right wrist and hand, initial encounter for closed fracture: Secondary | ICD-10-CM

## 2021-09-26 DIAGNOSIS — S199XXA Unspecified injury of neck, initial encounter: Secondary | ICD-10-CM | POA: Diagnosis not present

## 2021-09-26 DIAGNOSIS — G238 Other specified degenerative diseases of basal ganglia: Secondary | ICD-10-CM | POA: Diagnosis not present

## 2021-09-26 DIAGNOSIS — R0902 Hypoxemia: Secondary | ICD-10-CM | POA: Diagnosis not present

## 2021-09-26 DIAGNOSIS — S6991XA Unspecified injury of right wrist, hand and finger(s), initial encounter: Secondary | ICD-10-CM | POA: Diagnosis present

## 2021-09-26 DIAGNOSIS — M40202 Unspecified kyphosis, cervical region: Secondary | ICD-10-CM | POA: Diagnosis not present

## 2021-09-26 DIAGNOSIS — S62336A Displaced fracture of neck of fifth metacarpal bone, right hand, initial encounter for closed fracture: Secondary | ICD-10-CM | POA: Insufficient documentation

## 2021-09-26 DIAGNOSIS — W19XXXA Unspecified fall, initial encounter: Secondary | ICD-10-CM | POA: Diagnosis not present

## 2021-09-26 DIAGNOSIS — Z79899 Other long term (current) drug therapy: Secondary | ICD-10-CM | POA: Diagnosis not present

## 2021-09-26 DIAGNOSIS — W01198A Fall on same level from slipping, tripping and stumbling with subsequent striking against other object, initial encounter: Secondary | ICD-10-CM | POA: Insufficient documentation

## 2021-09-26 DIAGNOSIS — R519 Headache, unspecified: Secondary | ICD-10-CM | POA: Diagnosis not present

## 2021-09-26 DIAGNOSIS — J439 Emphysema, unspecified: Secondary | ICD-10-CM | POA: Diagnosis not present

## 2021-09-26 DIAGNOSIS — R001 Bradycardia, unspecified: Secondary | ICD-10-CM | POA: Diagnosis not present

## 2021-09-26 DIAGNOSIS — I959 Hypotension, unspecified: Secondary | ICD-10-CM | POA: Diagnosis not present

## 2021-09-26 DIAGNOSIS — S62616A Displaced fracture of proximal phalanx of right little finger, initial encounter for closed fracture: Secondary | ICD-10-CM | POA: Diagnosis not present

## 2021-09-26 DIAGNOSIS — Z7982 Long term (current) use of aspirin: Secondary | ICD-10-CM | POA: Diagnosis not present

## 2021-09-26 DIAGNOSIS — G319 Degenerative disease of nervous system, unspecified: Secondary | ICD-10-CM | POA: Diagnosis not present

## 2021-09-26 DIAGNOSIS — G9389 Other specified disorders of brain: Secondary | ICD-10-CM | POA: Diagnosis not present

## 2021-09-26 DIAGNOSIS — S61411A Laceration without foreign body of right hand, initial encounter: Secondary | ICD-10-CM | POA: Insufficient documentation

## 2021-09-26 DIAGNOSIS — H543 Unqualified visual loss, both eyes: Secondary | ICD-10-CM

## 2021-09-26 DIAGNOSIS — S0990XA Unspecified injury of head, initial encounter: Secondary | ICD-10-CM | POA: Diagnosis not present

## 2021-09-26 LAB — CBC WITH DIFFERENTIAL/PLATELET
Abs Immature Granulocytes: 0.01 10*3/uL (ref 0.00–0.07)
Basophils Absolute: 0 10*3/uL (ref 0.0–0.1)
Basophils Relative: 1 %
Eosinophils Absolute: 0 10*3/uL (ref 0.0–0.5)
Eosinophils Relative: 0 %
HCT: 38 % (ref 36.0–46.0)
Hemoglobin: 11.7 g/dL — ABNORMAL LOW (ref 12.0–15.0)
Immature Granulocytes: 0 %
Lymphocytes Relative: 16 %
Lymphs Abs: 0.7 10*3/uL (ref 0.7–4.0)
MCH: 29.7 pg (ref 26.0–34.0)
MCHC: 30.8 g/dL (ref 30.0–36.0)
MCV: 96.4 fL (ref 80.0–100.0)
Monocytes Absolute: 0.3 10*3/uL (ref 0.1–1.0)
Monocytes Relative: 6 %
Neutro Abs: 3.3 10*3/uL (ref 1.7–7.7)
Neutrophils Relative %: 77 %
Platelets: 153 10*3/uL (ref 150–400)
RBC: 3.94 MIL/uL (ref 3.87–5.11)
RDW: 15.1 % (ref 11.5–15.5)
WBC: 4.3 10*3/uL (ref 4.0–10.5)
nRBC: 0 % (ref 0.0–0.2)

## 2021-09-26 LAB — BASIC METABOLIC PANEL
Anion gap: 10 (ref 5–15)
BUN: 22 mg/dL (ref 8–23)
CO2: 27 mmol/L (ref 22–32)
Calcium: 9.1 mg/dL (ref 8.9–10.3)
Chloride: 104 mmol/L (ref 98–111)
Creatinine, Ser: 0.53 mg/dL (ref 0.44–1.00)
GFR, Estimated: >60 mL/min (ref >60)
Glucose, Bld: 71 mg/dL (ref 70–99)
Potassium: 3.4 mmol/L — ABNORMAL LOW (ref 3.5–5.1)
Sodium: 141 mmol/L (ref 135–145)

## 2021-09-26 LAB — URINALYSIS, ROUTINE W REFLEX MICROSCOPIC
Bacteria, UA: NONE SEEN
Bilirubin Urine: NEGATIVE
Glucose, UA: NEGATIVE mg/dL
Ketones, ur: 80 mg/dL — AB
Leukocytes,Ua: NEGATIVE
Nitrite: NEGATIVE
Protein, ur: 30 mg/dL — AB
Specific Gravity, Urine: 1.019 (ref 1.005–1.030)
pH: 6 (ref 5.0–8.0)

## 2021-09-26 MED ORDER — ALPRAZOLAM 0.5 MG PO TABS
1.0000 mg | ORAL_TABLET | Freq: Two times a day (BID) | ORAL | Status: DC
  Administered 2021-09-26 – 2021-09-27 (×2): 1 mg via ORAL
  Filled 2021-09-26 (×2): qty 2

## 2021-09-26 MED ORDER — SIMVASTATIN 10 MG PO TABS
40.0000 mg | ORAL_TABLET | Freq: Every evening | ORAL | Status: DC
  Administered 2021-09-26: 40 mg via ORAL
  Filled 2021-09-26: qty 4

## 2021-09-26 MED ORDER — ACETAMINOPHEN 325 MG PO TABS
650.0000 mg | ORAL_TABLET | Freq: Four times a day (QID) | ORAL | Status: DC | PRN
  Administered 2021-09-26: 650 mg via ORAL
  Filled 2021-09-26: qty 2

## 2021-09-26 MED ORDER — METOPROLOL SUCCINATE ER 50 MG PO TB24
50.0000 mg | ORAL_TABLET | Freq: Every day | ORAL | Status: DC
  Administered 2021-09-26 – 2021-09-27 (×2): 50 mg via ORAL
  Filled 2021-09-26 (×2): qty 1

## 2021-09-26 NOTE — ED Triage Notes (Addendum)
Patient via EMS due to fall the previous night, has complaints of hand pain with small laceration not actively bleeding. Family states she has been taking out of he head the past couple days. Patient is alert and oriented.  ?

## 2021-09-26 NOTE — Evaluation (Signed)
Physical Therapy Evaluation ?Patient Details ?Name: Denise Mendoza ?MRN: [id number] ?DOB: 1932-04-12 ?Today's Date: 09/26/2021 ? ?History of Present Illness ? Denise Mendoza is a 88 y.o. female.     Patient presents ER chief complaint of fall last night.  Family is concerned that she is not quite acting herself.  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  She states her son checks on her 3 times a day and her family member lives right next door.  Otherwise denies any specific headache or chest pain or abdominal pain.  Complaining of right hand pain where she fell on it. ?  ?Clinical Impression ? Patient presents with splint to right hand due to fracture after fall, demonstrates fair/good return for getting out of/into bed, unsteady on feet and limited for ambulation due to fatigue and poor eyesight.  Patient put back to bed after therapy with call light in reach.  Patient will benefit from continued skilled physical therapy in hospital and recommended venue below to increase strength, balance, endurance for safe ADLs and gait.    ?   ? ?Recommendations for follow up therapy are one component of a multi-disciplinary discharge planning process, led by the attending physician.  Recommendations may be updated based on patient status, additional functional criteria and insurance authorization. ? ?Follow Up Recommendations Skilled nursing-short term rehab (<3 hours/day) ? ?  ?Assistance Recommended at Discharge Intermittent Supervision/Assistance  ?Patient can return home with the following ? A little help with walking and/or transfers;A little help with bathing/dressing/bathroom;Assistance with cooking/housework;Help with stairs or ramp for entrance ? ?  ?Equipment Recommendations None recommended by PT  ?Recommendations for Other Services ?    ?  ?Functional Status Assessment Patient has had a recent decline in their functional status and demonstrates the ability to make significant improvements in function in a  reasonable and predictable amount of time.  ? ?  ?Precautions / Restrictions Precautions ?Precautions: Fall ?Required Braces or Orthoses: Splint/Cast ?Splint/Cast: right hand ?Restrictions ?Weight Bearing Restrictions: Yes ?RUE Weight Bearing: Non weight bearing  ? ?  ? ?Mobility ? Bed Mobility ?Overal bed mobility: Needs Assistance ?Bed Mobility: Supine to Sit, Sit to Supine ?  ?  ?Supine to sit: Min guard ?Sit to supine: Min guard ?  ?General bed mobility comments: fair/good return for getting into/out of bed ?  ? ?Transfers ?Overall transfer level: Needs assistance ?Equipment used: 1 person hand held assist ?Transfers: Sit to/from Stand, Bed to chair/wheelchair/BSC ?Sit to Stand: Min guard ?  ?Step pivot transfers: Min guard ?  ?  ?  ?General transfer comment: slightly unsteady labored movement ?  ? ?Ambulation/Gait ?Ambulation/Gait assistance: Min assist ?Gait Distance (Feet): 45 Feet ?Assistive device: 1 person hand held assist ?Gait Pattern/deviations: Decreased step length - right, Decreased step length - left, Decreased stride length ?Gait velocity: decreased ?  ?  ?General Gait Details: slightly unsteady labored cadence without loss of balance ? ?Stairs ?  ?  ?  ?  ?  ? ?Wheelchair Mobility ?  ? ?Modified Rankin (Stroke Patients Only) ?  ? ?  ? ?Balance Overall balance assessment: Needs assistance ?Sitting-balance support: Feet supported, No upper extremity supported ?Sitting balance-Leahy Scale: Good ?Sitting balance - Comments: seated at EOB ?  ?Standing balance support: During functional activity, Single extremity supported ?Standing balance-Leahy Scale: Fair ?Standing balance comment: hand held assist ?  ?  ?  ?  ?  ?  ?  ?  ?  ?  ?  ?   ? ? ? ?  Pertinent Vitals/Pain Pain Assessment ?Pain Assessment: No/denies pain  ? ? ?Home Living Family/patient expects to be discharged to:: Private residence ?Living Arrangements: Alone ?Available Help at Discharge: Family;Available PRN/intermittently ?Type of Home:  House ?Home Access: Stairs to enter ?  ?Entrance Stairs-Number of Steps: 2 ?  ?Home Layout: One level ?Home Equipment: Cane - single point;BSC/3in1 ?Additional Comments: takes sponge paths  ?  ?Prior Function Prior Level of Function : Needs assist ?  ?  ?  ?Physical Assist : Mobility (physical);ADLs (physical) ?Mobility (physical): Bed mobility;Transfers;Gait;Stairs ?  ?Mobility Comments: household and short distanced community ambulator using [facility], legally blind ?ADLs Comments: assisted by family ?  ? ? ?Hand Dominance  ? Dominant Hand: Right ? ?  ?Extremity/Trunk Assessment  ? Upper Extremity Assessment ?Upper Extremity Assessment: Generalized weakness;RUE deficits/detail ?RUE Deficits / Details: grossly -4/5 except wrist/hand not tested ?RUE: Unable to fully assess due to immobilization ?RUE Sensation: WNL ?RUE Coordination: WNL ?  ? ?Lower Extremity Assessment ?Lower Extremity Assessment: Generalized weakness ?  ? ?Cervical / Trunk Assessment ?Cervical / Trunk Assessment: Normal  ?Communication  ? Communication: No difficulties  ?Cognition Arousal/Alertness: Awake/alert ?Behavior During Therapy: [facility] for tasks assessed/performed ?Overall Cognitive Status: Within Functional Limits for tasks assessed ?  ?  ?  ?  ?  ?  ?  ?  ?  ?  ?  ?  ?  ?  ?  ?  ?  ?  ?  ? ?  ?General Comments   ? ?  ?Exercises    ? ?Assessment/Plan  ?  ?PT Assessment Patient needs continued PT services  ?PT Problem List Decreased strength;Decreased range of motion;Decreased activity tolerance;Decreased balance;Decreased mobility ? ?   ?  ?PT Treatment Interventions DME instruction;Gait training;Stair training;Functional mobility training;Therapeutic activities;Therapeutic exercise;Balance training;Patient/family education   ? ?PT Goals (Current goals can be found in the Care Plan section)  ?Acute Rehab PT Goals ?Patient Stated Goal: return home after rehab ?PT Goal Formulation: With patient ?Time For Goal Achievement: 10/10/21 ? ?  ?Frequency Min  2X/week ?  ? ? ?Co-evaluation   ?  ?  ?  ?  ? ? ?  ?AM-PAC PT "6 Clicks" Mobility  ?Outcome Measure Help needed turning from your back to your side while in a flat bed without using bedrails?: None ?Help needed moving from lying on your back to sitting on the side of a flat bed without using bedrails?: None ?Help needed moving to and from a bed to a chair (including a wheelchair)?: A Little ?Help needed standing up from a chair using your arms (e.g., wheelchair or bedside chair)?: A Little ?Help needed to walk in hospital room?: A Little ?Help needed climbing 3-5 steps with a railing? : A Lot ?6 Click Score: 19 ? ?  ?End of Session   ?Activity Tolerance: Patient tolerated treatment well;Patient limited by fatigue ?Patient left: in bed;with call bell/phone within reach ?Nurse Communication: Mobility status ?PT Visit Diagnosis: Unsteadiness on feet (R26.81);Other abnormalities of gait and mobility (R26.89);Muscle weakness (generalized) (M62.81) ?  ? ?Time: [id number] ?PT Time Calculation (min) (ACUTE ONLY): 20 min ? ? ?Charges:   PT Evaluation ?$PT Eval Moderate Complexity: 1 Mod ?PT Treatments ?$Therapeutic Activity: 8-22 mins ?  ?   ? ? ?4:05 PM, 09/26/21 ?Lonell Grandchild, MPT ?Physical Therapist with [place] ?[facility] ?[phone] office ?[phone] mobile phone ? ? ?

## 2021-09-26 NOTE — ED Notes (Signed)
Just fed pt dinner ?

## 2021-09-26 NOTE — ED Notes (Signed)
Pt on phone call with SW ?

## 2021-09-26 NOTE — ED Notes (Signed)
CSW spoke with pt about PT recommending SNF at D/C. Pt states that she feels this would be best for her and she is agreeable to referral being sent to [place] and [place] facilities. CSW to complete Fl2 and send out referral. TOC to follow.  ?

## 2021-09-26 NOTE — ED Notes (Signed)
Spoke with pt son, Francee Piccolo. He requested social work to contact him regarding placement. States he is healthcare power of attorney. Message sent to social work. ?

## 2021-09-26 NOTE — ED Provider Notes (Signed)
?[facility] EMERGENC[facility] DEPARTMENT ?Provider Note ? ? ?CSN: [id number] ?Arrival date & time: 09/26/21  1020 ? ?  ? ?History ? ?Chief Complaint  ?Patient presents with  ? Fall  ? ? ?Denise Mendoza is a 88 y.o. female. ? ?Patient presents ER chief complaint of fall last night.  Family is concerned that she is not quite acting herself.  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  She states her son checks on her 3 times a day and her family member lives right next door.  Otherwise denies any specific headache or chest pain or abdominal pain.  Complaining of right hand pain where she fell on it. ? ? ?  ? ?Home Medications ?Prior to Admission medications   ?Medication Sig Start Date End Date Taking? Authorizing Provider  ?acetaminophen (TYLENOL) 325 MG tablet Take 2 tablets (650 mg total) by mouth every 6 (six) hours as needed for mild pain or headache (or Fever >/= 101). 03/26/21   Vassie Loll, MD  ?ALPRAZolam Prudy Feeler) 0.5 MG tablet Take 1 mg by mouth 2 (two) times daily.    [provider]  ?amitriptyline (ELAVIL) 10 MG tablet TAKE ONE TABLET BY MOUTH AT BEDTIME 01/04/21   Vickki Hearing, MD  ?aspirin EC 81 MG EC tablet Take 1 tablet (81 mg total) by mouth daily with breakfast. Swallow whole. 03/27/21   Vassie Loll, MD  ?guaiFENesin-dextromethorphan [facility] DM) 100-10 MG/5ML syrup Take 10 mLs by mouth every 8 (eight) hours. 01/20/21   Kendell Bane, MD  ?Ipratropium-Albuterol (COMBIVENT) 20-100 MCG/ACT AERS respimat Inhale 1 puff into the lungs every 6 (six) hours as needed for wheezing or shortness of breath. 01/20/21 04/24/21  Kendell Bane, MD  ?metoprolol succinate (TOPROL-XL) 50 MG 24 hr tablet Take 1 tablet (50 mg total) by mouth daily. Take with or immediately following a meal. 03/27/21   Vassie Loll, MD  ?simvastatin (ZOCOR) 40 MG tablet Take 1 tablet (40 mg total) by mouth every evening. 01/20/21 03/22/21  Kendell Bane, MD  ?   ? ?Allergies    ?Patient has no known  allergies.   ? ?Review of Systems   ?Review of Systems  ?Constitutional:  Negative for fever.  ?HENT:  Negative for ear pain.   ?Eyes:  Negative for pain.  ?Respiratory:  Negative for cough.   ?Cardiovascular:  Negative for chest pain.  ?Gastrointestinal:  Negative for abdominal pain.  ?Genitourinary:  Negative for flank pain.  ?Musculoskeletal:  Negative for back pain.  ?Skin:  Negative for rash.  ?Neurological:  Negative for headaches.  ? ?Physical Exam ?Updated Vital Signs ?BP (!) 142/52   Pulse 63   Temp 97.6 ?F (36.4 ?C) (Oral)   Resp (!) 25   Ht 5\' 3"  (1.6 m)   Wt 40.8 kg   SpO2 98%   BMI 15.94 kg/m?  ?Physical Exam ?Constitutional:   ?   General: She is not in acute distress. ?   Appearance: Normal appearance.  ?HENT:  ?   Head: Normocephalic.  ?   Nose: Nose normal.  ?Eyes:  ?   Extraocular Movements: Extraocular movements intact.  ?Cardiovascular:  ?   Rate and Rhythm: Normal rate.  ?Pulmonary:  ?   Effort: Pulmonary effort is normal.  ?Musculoskeletal:  ?   Cervical back: Normal range of motion.  ?   Comments: Skin tear on the right hand fifth MCP joint.  Otherwise ecchymosis and swelling to the right hand fourth and fifth fingers.  Neurovascularly intact otherwise compartments are soft. ? ?No pain with range of motion of the hips ankles and knees or shoulders and elbows.  No pain with palpation of TC or L-spine.  ?Neurological:  ?   General: No focal deficit present.  ?   Mental Status: She is alert. Mental status is at baseline.  ? ? ?ED Results / Procedures / Treatments   ?Labs ?(all labs ordered are listed, but only abnormal results are displayed) ?Labs Reviewed  ?CBC WITH DIFFERENTIAL/PLATELET - Abnormal; Notable for the following components:  ?    Result Value  ? Hemoglobin 11.7 (*)   ? All other components within normal limits  ?BASIC METABOLIC PANEL - Abnormal; Notable for the following components:  ? Potassium 3.4 (*)   ? All other components within normal limits  ?URINALYSIS, ROUTINE W  REFLEX MICROSCOPIC - Abnormal; Notable for the following components:  ? Hgb urine dipstick SMALL (*)   ? Ketones, ur 80 (*)   ? Protein, ur 30 (*)   ? All other components within normal limits  ? ? ?EKG ?None ? ?Radiology ?CT Head Wo Contrast ? ?Result Date: 09/26/2021 ?CLINICAL DATA:  Larey Seat from couch. Hit head. EXAM: CT HEAD WITHOUT CONTRAST CT CERVICAL SPINE WITHOUT CONTRAST TECHNIQUE: Multidetector CT imaging of the head and cervical spine was performed following the standard protocol without intravenous contrast. Multiplanar CT image reconstructions of the cervical spine were also generated. RADIATION DOSE REDUCTION: This exam was performed according to the departmental dose-optimization program which includes automated exposure control, adjustment of the mA and/or kV according to patient size and/or use of iterative reconstruction technique. COMPARISON:  04/28/2021 FINDINGS: CT HEAD FINDINGS Brain: Stable age related cerebral atrophy, ventriculomegaly and periventricular white matter disease. Stable bilateral basal ganglia calcifications. No extra-axial fluid collections are identified. No CT findings for acute hemispheric infarction or intracranial hemorrhage. No mass lesions. The brainstem and cerebellum are normal. Vascular: Stable vascular calcifications. No aneurysm or hyperdense vessels. Skull: No skull fracture or bone lesions. Sinuses/Orbits: The paranasal sinuses and mastoid air cells are clear. Other: No scalp lesions or scalp hematoma. CT CERVICAL SPINE FINDINGS Alignment: The cervical vertebral bodies are normally aligned. There is a markedly exaggerated cervical lordosis due to a severe thoracic kyphosis. Skull base and vertebrae: No acute fracture. No primary bone lesion or focal pathologic process. Soft tissues and spinal canal: No prevertebral fluid or swelling. No visible canal hematoma. Disc levels: Very generous spinal canal. No spinal stenosis. Mild facet disease and uncinate spurring but no  significant foraminal stenosis. Upper chest: Emphysematous changes are noted at the lung apices along with pleural and parenchymal scarring but no pulmonary lesions. Other: No neck mass or adenopathy. There are changes of osteoporosis and numerous upper thoracic compression deformities. IMPRESSION: 1. Stable age related cerebral atrophy, ventriculomegaly and periventricular white matter disease. 2. No acute intracranial findings or skull fracture. 3. No acute cervical spine fracture or subluxation. Electronically Signed   By: Rudie Meyer M.D.   On: 09/26/2021 11:34  ? ?CT Cervical Spine Wo Contrast ? ?Result Date: 09/26/2021 ?CLINICAL DATA:  Larey Seat from couch. Hit head. EXAM: CT HEAD WITHOUT CONTRAST CT CERVICAL SPINE WITHOUT CONTRAST TECHNIQUE: Multidetector CT imaging of the head and cervical spine was performed following the standard protocol without intravenous contrast. Multiplanar CT image reconstructions of the cervical spine were also generated. RADIATION DOSE REDUCTION: This exam was performed according to the departmental dose-optimization program which includes automated exposure control, adjustment of the mA and/or kV  according to patient size and/or use of iterative reconstruction technique. COMPARISON:  04/28/2021 FINDINGS: CT HEAD FINDINGS Brain: Stable age related cerebral atrophy, ventriculomegaly and periventricular white matter disease. Stable bilateral basal ganglia calcifications. No extra-axial fluid collections are identified. No CT findings for acute hemispheric infarction or intracranial hemorrhage. No mass lesions. The brainstem and cerebellum are normal. Vascular: Stable vascular calcifications. No aneurysm or hyperdense vessels. Skull: No skull fracture or bone lesions. Sinuses/Orbits: The paranasal sinuses and mastoid air cells are clear. Other: No scalp lesions or scalp hematoma. CT CERVICAL SPINE FINDINGS Alignment: The cervical vertebral bodies are normally aligned. There is a markedly  exaggerated cervical lordosis due to a severe thoracic kyphosis. Skull base and vertebrae: No acute fracture. No primary bone lesion or focal pathologic process. Soft tissues and spinal canal: No prevertebral fluid

## 2021-09-26 NOTE — Plan of Care (Signed)
?  Problem: Acute Rehab PT Goals(only PT should resolve) ?Goal: Pt Will Go Supine/Side To Sit ?Outcome: Progressing ?Flowsheets (Taken 09/26/2021 1607) ?Pt will go Supine/Side to Sit: ? with modified independence ? Independently ?Goal: Patient Will Transfer Sit To/From Stand ?Outcome: Progressing ?Flowsheets (Taken 09/26/2021 1607) ?Patient will transfer sit to/from stand: with modified independence ?Goal: Pt Will Transfer Bed To Chair/Chair To Bed ?Outcome: Progressing ?Goal: Pt Will Ambulate ?Outcome: Progressing ?Flowsheets (Taken 09/26/2021 1607) ?Pt will Ambulate: ? 75 feet ? with modified independence ? with supervision ? with cane ?  ?4:08 PM, 09/26/21 ?Ocie Bob, MPT ?Physical Therapist with [place] ?[facility] ?[phone] office ?[phone] mobile phone ? ?

## 2021-09-26 NOTE — ED Notes (Signed)
Pt requesting pain meds   MD made aware

## 2021-09-26 NOTE — ED Notes (Signed)
Pt resting comfortably in bed. Pt is oriented to person and place but is asking questions and making statements that dont aline with the situation or timing. Pt complaining of hand pain but is also taking about aneurysms in the blood.  ? ? ?

## 2021-09-26 NOTE — ED Notes (Signed)
In and out done to get urine sample  ?

## 2021-09-26 NOTE — NC FL2 (Signed)
?[place] MEDICAID FL2 LEVEL OF CARE SCREENING TOOL  ?  ? ?IDENTIFICATION  ?Patient Name: ?Denise Mendoza Birthdate: Oct 18, 1931 Sex: female Admission Date (Current Location): ?09/26/2021  ?[place] Number: ? [place] ?  Facility and Address:  ?[facility],  [address] [place] [address] ?     Provider Number: ?[id number]  ?Attending Physician Name and Address:  ?Cheryll Cockayne, MD ? Relative Name and Phone Number:  ?  ?   ?Current Level of Care: ?Hospital Recommended Level of Care: ?Skilled Nursing Facility Prior Approval Number: ?  ? ?Date Approved/Denied: ?  PASRR Number: ?[id number] A ? ?Discharge Plan: ?SNF ?  ? ?Current Diagnoses: ?Patient Active Problem List  ? Diagnosis Date Noted  ? Fall   ? Acute metabolic encephalopathy 03/22/2021  ? Left wrist fracture, closed, initial encounter 03/22/2021  ? Left wrist fracture 03/22/2021  ? Pneumonia due to COVID-19 virus 01/17/2021  ? Acute cystitis without hematuria   ? Pulmonary emphysema (HCC)   ? Closed displaced fracture of right femoral neck with routine healing 01/01/2020  ? Hypokalemia 01/01/2020  ? Dehydration 01/01/2020  ? Respiratory failure with hypoxia (HCC) 01/01/2020  ? Vitamin B12 deficiency 01/01/2020  ? Thrombocytopenia (HCC) 01/01/2020  ? Leukocytosis 01/01/2020  ? Acute lower UTI 01/01/2020  ? Essential hypertension 01/01/2020  ? Hyperlipidemia 01/01/2020  ? Altered mental status 12/31/2019  ? Bilateral leg pain 05/12/2012  ? Hip pain 05/12/2012  ? ? ?Orientation RESPIRATION BLADDER Height & Weight   ?  ?Self, Place, Time, Situation ? Normal Continent Weight: 90 lb (40.8 kg) ?Height:  5\' 3"  (160 cm)  ?BEHAVIORAL SYMPTOMS/MOOD NEUROLOGICAL BOWEL NUTRITION STATUS  ?    Continent Diet (Regular)  ?AMBULATORY STATUS COMMUNICATION OF NEEDS Skin   ?Extensive Assist Verbally Normal ?  ?  ?  ?    ?     ?     ? ? ?Personal Care Assistance Level of Assistance  ?Bathing, Feeding, Dressing Bathing Assistance: Limited  assistance ?Feeding assistance: Independent ?Dressing Assistance: Limited assistance ?   ? ?Functional Limitations Info  ?Sight, Hearing, Speech Sight Info: Impaired (legally blind) ?Hearing Info: Impaired ?Speech Info: Adequate  ? ? ?SPECIAL CARE FACTORS FREQUENCY  ?PT (By licensed PT), OT (By licensed OT)   ?  ?PT Frequency: 5 times weekly ?OT Frequency: 5 times weekly ?  ?  ?  ?   ? ? ?Contractures Contractures Info: Not present  ? ? ?Additional Factors Info  ?Code Status, Allergies Code Status Info: DNR ?Allergies Info: NKA ?  ?  ?  ?   ? ?Current Medications (09/26/2021):  This is the current hospital active medication list ?No current facility-administered medications for this encounter.  ? ?Current Outpatient Medications  ?Medication Sig Dispense Refill  ? ALPRAZolam (XANAX) 0.5 MG tablet Take 1 mg by mouth 2 (two) times daily.    ? simvastatin (ZOCOR) 40 MG tablet Take 1 tablet (40 mg total) by mouth every evening. 30 tablet 1  ? traMADol (ULTRAM) 50 MG tablet Take 50 mg by mouth 4 (four) times daily.    ? acetaminophen (TYLENOL) 325 MG tablet Take 2 tablets (650 mg total) by mouth every 6 (six) hours as needed for mild pain or headache (or Fever >/= 101). (Patient not taking: Reported on 09/26/2021)    ? amitriptyline (ELAVIL) 10 MG tablet TAKE ONE TABLET BY MOUTH AT BEDTIME (Patient not taking: Reported on 09/26/2021) 60 tablet 2  ? aspirin EC 81 MG EC tablet  Take 1 tablet (81 mg total) by mouth daily with breakfast. Swallow whole. (Patient not taking: Reported on 09/26/2021) 30 tablet 11  ? guaiFENesin-dextromethorphan (ROBITUSSIN DM) 100-10 MG/5ML syrup Take 10 mLs by mouth every 8 (eight) hours. (Patient not taking: Reported on 09/26/2021) 118 mL 0  ? Ipratropium-Albuterol (COMBIVENT) 20-100 MCG/ACT AERS respimat Inhale 1 puff into the lungs every 6 (six) hours as needed for wheezing or shortness of breath. (Patient not taking: Reported on 09/26/2021) 4 g 2  ? metoprolol succinate (TOPROL-XL) 50 MG 24 hr  tablet Take 1 tablet (50 mg total) by mouth daily. Take with or immediately following a meal. (Patient not taking: Reported on 09/26/2021) 30 tablet 2  ? metoprolol tartrate (LOPRESSOR) 25 MG tablet Take 25 mg by mouth daily. (Patient not taking: Reported on 09/26/2021)    ? ? ? ?Discharge Medications: ?Please see discharge summary for a list of discharge medications. ? ?Relevant Imaging Results: ? ?Relevant Lab Results: ? ? ?Additional Information ?SSN: [id number] ? ?Villa HerbSamantha  Jamiria Langill, LCSWA ? ? ? ? ?

## 2021-09-27 DIAGNOSIS — E559 Vitamin D deficiency, unspecified: Secondary | ICD-10-CM | POA: Diagnosis not present

## 2021-09-27 DIAGNOSIS — F339 Major depressive disorder, recurrent, unspecified: Secondary | ICD-10-CM | POA: Diagnosis not present

## 2021-09-27 DIAGNOSIS — S6291XD Unspecified fracture of right wrist and hand, subsequent encounter for fracture with routine healing: Secondary | ICD-10-CM | POA: Diagnosis not present

## 2021-09-27 DIAGNOSIS — S62101A Fracture of unspecified carpal bone, right wrist, initial encounter for closed fracture: Secondary | ICD-10-CM | POA: Diagnosis not present

## 2021-09-27 DIAGNOSIS — S62336A Displaced fracture of neck of fifth metacarpal bone, right hand, initial encounter for closed fracture: Secondary | ICD-10-CM | POA: Diagnosis not present

## 2021-09-27 DIAGNOSIS — Z760 Encounter for issue of repeat prescription: Secondary | ICD-10-CM | POA: Diagnosis not present

## 2021-09-27 DIAGNOSIS — M6281 Muscle weakness (generalized): Secondary | ICD-10-CM | POA: Diagnosis not present

## 2021-09-27 DIAGNOSIS — F419 Anxiety disorder, unspecified: Secondary | ICD-10-CM | POA: Diagnosis not present

## 2021-09-27 DIAGNOSIS — Z7982 Long term (current) use of aspirin: Secondary | ICD-10-CM | POA: Diagnosis not present

## 2021-09-27 DIAGNOSIS — F411 Generalized anxiety disorder: Secondary | ICD-10-CM | POA: Diagnosis not present

## 2021-09-27 DIAGNOSIS — S61411A Laceration without foreign body of right hand, initial encounter: Secondary | ICD-10-CM | POA: Diagnosis not present

## 2021-09-27 DIAGNOSIS — Z79899 Other long term (current) drug therapy: Secondary | ICD-10-CM | POA: Diagnosis not present

## 2021-09-27 DIAGNOSIS — W19XXXD Unspecified fall, subsequent encounter: Secondary | ICD-10-CM | POA: Diagnosis not present

## 2021-09-27 DIAGNOSIS — E785 Hyperlipidemia, unspecified: Secondary | ICD-10-CM | POA: Diagnosis not present

## 2021-09-27 DIAGNOSIS — R262 Difficulty in walking, not elsewhere classified: Secondary | ICD-10-CM | POA: Diagnosis not present

## 2021-09-27 DIAGNOSIS — R519 Headache, unspecified: Secondary | ICD-10-CM | POA: Diagnosis not present

## 2021-09-27 NOTE — Discharge Instructions (Addendum)
Continue taking her medicines.  You are taking Xanax 0.5 mg twice a day, Zocor 40 mg every evening.  And Toprol XL 50 mg once a day ?

## 2021-09-27 NOTE — NC FL2 (Signed)
?[place] MEDICAID FL2 LEVEL OF CARE SCREENING TOOL  ?  ? ?IDENTIFICATION  ?Patient Name: ?Denise Mendoza Birthdate: May 11, 1932 Sex: female Admission Date (Current Location): ?09/26/2021  ?[place] Number: ? [place] ?  Facility and Address:  ?[facility],  [address], [place] [address] ?     Provider Number: ?[id number]  ?Attending Physician Name and Address:  ?Default, Provider, MD ? Relative Name and Phone Number:  ?  ?   ?Current Level of Care: ?Hospital Recommended Level of Care: ?Skilled Nursing Facility Prior Approval Number: ?  ? ?Date Approved/Denied: ?  PASRR Number: ?[id number] A ? ?Discharge Plan: ?SNF ?  ? ?Current Diagnoses: ?Patient Active Problem List  ? Diagnosis Date Noted  ? Fall   ? Acute metabolic encephalopathy 03/22/2021  ? Left wrist fracture, closed, initial encounter 03/22/2021  ? Left wrist fracture 03/22/2021  ? Pneumonia due to COVID-19 virus 01/17/2021  ? Acute cystitis without hematuria   ? Pulmonary emphysema (HCC)   ? Closed displaced fracture of right femoral neck with routine healing 01/01/2020  ? Hypokalemia 01/01/2020  ? Dehydration 01/01/2020  ? Respiratory failure with hypoxia (HCC) 01/01/2020  ? Vitamin B12 deficiency 01/01/2020  ? Thrombocytopenia (HCC) 01/01/2020  ? Leukocytosis 01/01/2020  ? Acute lower UTI 01/01/2020  ? Essential hypertension 01/01/2020  ? Hyperlipidemia 01/01/2020  ? Altered mental status 12/31/2019  ? Bilateral leg pain 05/12/2012  ? Hip pain 05/12/2012  ? ? ?Orientation RESPIRATION BLADDER Height & Weight   ?  ?Self, Place, Time, Situation ? Normal Continent Weight: 90 lb (40.8 kg) ?Height:   (160 cm)  ?BEHAVIORAL SYMPTOMS/MOOD NEUROLOGICAL BOWEL NUTRITION STATUS  ?    Continent Diet (Regular)  ?AMBULATORY STATUS COMMUNICATION OF NEEDS Skin   ?Extensive Assist Verbally Normal ?  ?  ?  ?    ?     ?     ? ? ?Personal Care Assistance Level of Assistance  ?Bathing, Feeding, Dressing Bathing Assistance: Limited  assistance ?Feeding assistance: Independent ?Dressing Assistance: Limited assistance ?   ? ?Functional Limitations Info  ?Sight, Hearing, Speech Sight Info: Impaired (legally blind) ?Hearing Info: Impaired ?Speech Info: Adequate  ? ? ?SPECIAL CARE FACTORS FREQUENCY  ?PT (By licensed PT), OT (By licensed OT)   ?  ?PT Frequency: 5 times weekly ?OT Frequency: 5 times weekly ?  ?  ?  ?   ? ? ?Contractures Contractures Info: Not present  ? ? ?Additional Factors Info  ?Code Status, Allergies Code Status Info: DNR ?Allergies Info: NKA ?  ?  ?  ?   ? ?Current Medications (09/27/2021):  This is the current hospital active medication list ?Current Facility-Administered Medications  ?Medication Dose Route Frequency Provider Last Rate Last Admin  ? acetaminophen (TYLENOL) tablet 650 mg  650 mg Oral Q6H PRN Gerhard Munch, MD   650 mg at 09/26/21 1950  ? ALPRAZolam Prudy Feeler) tablet 1 mg  1 mg Oral BID Gerhard Munch, MD   1 mg at 09/27/21 1046  ? metoprolol succinate (TOPROL-XL) 24 hr tablet 50 mg  50 mg Oral Daily Gerhard Munch, MD   50 mg at 09/27/21 1046  ? simvastatin (ZOCOR) tablet 40 mg  40 mg Oral QPM Gerhard Munch, MD   40 mg at 09/26/21 1950  ? ?Current Outpatient Medications  ?Medication Sig Dispense Refill  ? ALPRAZolam (XANAX) 0.5 MG tablet Take 1 mg by mouth 2 (two) times daily.    ? simvastatin (ZOCOR) 40 MG tablet Take 1 tablet (  40 mg total) by mouth every evening. 30 tablet 1  ? traMADol (ULTRAM) 50 MG tablet Take 50 mg by mouth 4 (four) times daily.    ? acetaminophen (TYLENOL) 325 MG tablet Take 2 tablets (650 mg total) by mouth every 6 (six) hours as needed for mild pain or headache (or Fever >/= 101). (Patient not taking: Reported on 09/26/2021)    ? amitriptyline (ELAVIL) 10 MG tablet TAKE ONE TABLET BY MOUTH AT BEDTIME (Patient not taking: Reported on 09/26/2021) 60 tablet 2  ? aspirin EC 81 MG EC tablet Take 1 tablet (81 mg total) by mouth daily with breakfast. Swallow whole. (Patient not taking:  Reported on 09/26/2021) 30 tablet 11  ? guaiFENesin-dextromethorphan (ROBITUSSIN DM) 100-10 MG/5ML syrup Take 10 mLs by mouth every 8 (eight) hours. (Patient not taking: Reported on 09/26/2021) 118 mL 0  ? Ipratropium-Albuterol (COMBIVENT) 20-100 MCG/ACT AERS respimat Inhale 1 puff into the lungs every 6 (six) hours as needed for wheezing or shortness of breath. (Patient not taking: Reported on 09/26/2021) 4 g 2  ? metoprolol succinate (TOPROL-XL) 50 MG 24 hr tablet Take 1 tablet (50 mg total) by mouth daily. Take with or immediately following a meal. (Patient not taking: Reported on 09/26/2021) 30 tablet 2  ? metoprolol tartrate (LOPRESSOR) 25 MG tablet Take 25 mg by mouth daily. (Patient not taking: Reported on 09/26/2021)    ? ? ? ?Discharge Medications: ?Please see after visit summary for a list of discharge medications. ? ?Relevant Imaging Results: ? ?Relevant Lab Results: ? ? ?Additional Information ?SSN: [id number] ? ?Villa Herb, LCSWA ? ? ? ? ?

## 2021-09-27 NOTE — ED Notes (Signed)
Pt able to walk to bsc without assistance. Pure wick placed again per pt request. Peri care done. ?

## 2021-09-27 NOTE — ED Notes (Addendum)
CSW met with pt in room to speak about bed offers. Pt is agreeable to accepting the bed offer from [facility]. CSW inquired abot if pt would like her son to be updated. Pt is agreeable to this and requested CSW call while in room. CSW spoke with pts son and pt to explain SNF and what facility had made bed offer. Pt and son agreeable to [facility]. ? ?CSW reached out to [place] with the facility who states she will send it over for review of the Medicare waiver. If she gets the okay they will be able to accept pt. TOC to follow.  ? ?CSW spoke with facility and they are able to accept pt today. CSW updated pt of this. CSW to update pts son and sister at her request. CSW updated Fl2 at facility request. AVS completed. CSW provided RN with number for report and room number at facility. CSW provided RN with the number for [facility] as pt will be able to ride to facility via wheelchair. TOC signing off.  ?

## 2021-10-01 DIAGNOSIS — F01C11 Vascular dementia, severe, with agitation: Secondary | ICD-10-CM | POA: Diagnosis not present

## 2021-10-01 DIAGNOSIS — R5381 Other malaise: Secondary | ICD-10-CM | POA: Diagnosis not present

## 2021-10-01 DIAGNOSIS — E785 Hyperlipidemia, unspecified: Secondary | ICD-10-CM | POA: Diagnosis not present

## 2021-10-01 DIAGNOSIS — R296 Repeated falls: Secondary | ICD-10-CM | POA: Diagnosis not present

## 2021-10-01 DIAGNOSIS — R52 Pain, unspecified: Secondary | ICD-10-CM | POA: Diagnosis not present

## 2021-10-01 DIAGNOSIS — S62616A Displaced fracture of proximal phalanx of right little finger, initial encounter for closed fracture: Secondary | ICD-10-CM | POA: Diagnosis not present

## 2021-10-01 DIAGNOSIS — W19XXXD Unspecified fall, subsequent encounter: Secondary | ICD-10-CM | POA: Diagnosis not present

## 2021-10-01 DIAGNOSIS — M6281 Muscle weakness (generalized): Secondary | ICD-10-CM | POA: Diagnosis not present

## 2021-10-01 DIAGNOSIS — F339 Major depressive disorder, recurrent, unspecified: Secondary | ICD-10-CM | POA: Diagnosis not present

## 2021-10-01 DIAGNOSIS — S62101A Fracture of unspecified carpal bone, right wrist, initial encounter for closed fracture: Secondary | ICD-10-CM | POA: Diagnosis not present

## 2021-10-01 DIAGNOSIS — R262 Difficulty in walking, not elsewhere classified: Secondary | ICD-10-CM | POA: Diagnosis not present

## 2021-10-01 DIAGNOSIS — S62626A Displaced fracture of medial phalanx of right little finger, initial encounter for closed fracture: Secondary | ICD-10-CM | POA: Diagnosis not present

## 2021-10-01 DIAGNOSIS — F419 Anxiety disorder, unspecified: Secondary | ICD-10-CM | POA: Diagnosis not present

## 2021-10-01 DIAGNOSIS — S6291XD Unspecified fracture of right wrist and hand, subsequent encounter for fracture with routine healing: Secondary | ICD-10-CM | POA: Diagnosis not present

## 2021-10-01 DIAGNOSIS — F331 Major depressive disorder, recurrent, moderate: Secondary | ICD-10-CM | POA: Diagnosis not present

## 2021-10-01 DIAGNOSIS — F411 Generalized anxiety disorder: Secondary | ICD-10-CM | POA: Diagnosis not present

## 2021-10-01 DIAGNOSIS — R2681 Unsteadiness on feet: Secondary | ICD-10-CM | POA: Diagnosis not present

## 2021-10-01 DIAGNOSIS — R4181 Age-related cognitive decline: Secondary | ICD-10-CM | POA: Diagnosis not present

## 2021-10-01 DIAGNOSIS — W19XXXA Unspecified fall, initial encounter: Secondary | ICD-10-CM | POA: Diagnosis not present

## 2021-10-01 DIAGNOSIS — M898X1 Other specified disorders of bone, shoulder: Secondary | ICD-10-CM | POA: Diagnosis not present

## 2021-10-01 DIAGNOSIS — M25512 Pain in left shoulder: Secondary | ICD-10-CM | POA: Diagnosis not present

## 2021-10-01 DIAGNOSIS — S6291XA Unspecified fracture of right wrist and hand, initial encounter for closed fracture: Secondary | ICD-10-CM | POA: Diagnosis not present

## 2021-10-01 DIAGNOSIS — F32A Depression, unspecified: Secondary | ICD-10-CM | POA: Diagnosis not present

## 2021-10-02 DIAGNOSIS — R5381 Other malaise: Secondary | ICD-10-CM | POA: Diagnosis not present

## 2021-10-02 DIAGNOSIS — S62101A Fracture of unspecified carpal bone, right wrist, initial encounter for closed fracture: Secondary | ICD-10-CM | POA: Diagnosis not present

## 2021-10-08 DIAGNOSIS — S62101A Fracture of unspecified carpal bone, right wrist, initial encounter for closed fracture: Secondary | ICD-10-CM | POA: Diagnosis not present

## 2021-10-08 DIAGNOSIS — F32A Depression, unspecified: Secondary | ICD-10-CM | POA: Diagnosis not present

## 2021-10-08 DIAGNOSIS — F419 Anxiety disorder, unspecified: Secondary | ICD-10-CM | POA: Diagnosis not present

## 2021-10-08 DIAGNOSIS — R5381 Other malaise: Secondary | ICD-10-CM | POA: Diagnosis not present

## 2021-10-09 ENCOUNTER — Ambulatory Visit (INDEPENDENT_AMBULATORY_CARE_PROVIDER_SITE_OTHER): Payer: Medicare Other | Admitting: Orthopedic Surgery

## 2021-10-09 ENCOUNTER — Ambulatory Visit (INDEPENDENT_AMBULATORY_CARE_PROVIDER_SITE_OTHER): Payer: Medicare Other

## 2021-10-09 DIAGNOSIS — S6291XA Unspecified fracture of right wrist and hand, initial encounter for closed fracture: Secondary | ICD-10-CM | POA: Diagnosis not present

## 2021-10-09 DIAGNOSIS — S62626A Displaced fracture of medial phalanx of right little finger, initial encounter for closed fracture: Secondary | ICD-10-CM

## 2021-10-09 DIAGNOSIS — S62616A Displaced fracture of proximal phalanx of right little finger, initial encounter for closed fracture: Secondary | ICD-10-CM | POA: Diagnosis not present

## 2021-10-09 NOTE — Patient Instructions (Signed)
Splint on fingers at all times.  Can remove for hygiene.  Buddy tape fingers together and then secure the splint.   ? ?Return to clinic in 2 weeks.  ?

## 2021-10-10 ENCOUNTER — Encounter: Payer: Self-pay | Admitting: Orthopedic Surgery

## 2021-10-10 NOTE — Progress Notes (Signed)
Orthopaedic Clinic Return ? ?Assessment: ?Denise Mendoza is a 88 y.o. female with the following: ?Right small finger, proximal and middle phalanx fractures ? ?Plan: ?Repeat radiographs obtained in clinic today demonstrates minimally displaced, but comminuted fractures of the distal proximal phalanx, as well as the distal middle phalanx of the right small finger.  She has diffuse arthritis throughout both hands.  Based on the appearance on today's radiographs, we will plan to treat these nonoperatively.  She may end up with a stiff finger.  She is okay with this.  Her fingers were buddy taped, and AlumaFoam splint was placed in clinic today.  We will see her back in 2 weeks for repeat evaluation. ? ?Follow-up: ?Return in about 2 weeks (around 10/23/2021). ? ? ?Subjective: ? ?Chief Complaint  ?Patient presents with  ? Hand Pain  ?  FX RT hand ?DOI 09/26/21  ? ? ?History of Present Illness: ?Denise Mendoza is a 88 y.o. female who presents to clinic for evaluation of right hand pain.  She sustained a fall approximately 1-2 weeks ago.  She had immediate pain and swelling in the right hand.  X-rays at the facility in which she lives demonstrates fractures of the phalanges to the small finger.  She has been in a splint.  She is tolerated the splint well.  She continues to have pain in the right hand. ? ?Review of Systems: ?No fevers or chills ?No numbness or tingling ?No chest pain ?No shortness of breath ?No bowel or bladder dysfunction ?No GI distress ?No headaches ? ? ?Objective: ?There were no vitals taken for this visit. ? ?Physical Exam: ? ?Elderly female.  Seated in a wheelchair. ? ?Evaluation the right hand demonstrates some dried blood.  She has some swelling about the ulnar aspect of the right hand, and into the small finger.  She does have tenderness to palpation over the proximal and middle phalanges.  Diffuse deformities throughout the hand consistent with arthritis.  Fingers are warm and  well-perfused. ? ?IMAGING: ?I personally ordered and reviewed the following images: ? ?X-ray of the right hand demonstrates comminuted fractures of the distal proximal phalanx, as well as the distal middle phalanx.  Overall alignment remains stable.  There are no dislocations.  She has diffuse arthritis throughout all joints in the fingers.  No additional injuries are noted. ? ?Impression: Right small finger proximal middle phalanges fractures in stable alignment. ? ? ?Oliver Barre, MD ?10/10/2021 ?10:56 AM ? ? ? ?

## 2021-10-11 DIAGNOSIS — R296 Repeated falls: Secondary | ICD-10-CM | POA: Diagnosis not present

## 2021-10-11 DIAGNOSIS — R2681 Unsteadiness on feet: Secondary | ICD-10-CM | POA: Diagnosis not present

## 2021-10-18 DIAGNOSIS — R2681 Unsteadiness on feet: Secondary | ICD-10-CM | POA: Diagnosis not present

## 2021-10-18 DIAGNOSIS — R296 Repeated falls: Secondary | ICD-10-CM | POA: Diagnosis not present

## 2021-10-18 DIAGNOSIS — M898X1 Other specified disorders of bone, shoulder: Secondary | ICD-10-CM | POA: Diagnosis not present

## 2021-10-23 ENCOUNTER — Ambulatory Visit (INDEPENDENT_AMBULATORY_CARE_PROVIDER_SITE_OTHER): Payer: Medicare Other | Admitting: Orthopedic Surgery

## 2021-10-23 ENCOUNTER — Encounter: Payer: Self-pay | Admitting: Orthopedic Surgery

## 2021-10-23 ENCOUNTER — Ambulatory Visit (INDEPENDENT_AMBULATORY_CARE_PROVIDER_SITE_OTHER): Payer: Medicare Other

## 2021-10-23 DIAGNOSIS — S62616A Displaced fracture of proximal phalanx of right little finger, initial encounter for closed fracture: Secondary | ICD-10-CM

## 2021-10-23 DIAGNOSIS — S62626A Displaced fracture of medial phalanx of right little finger, initial encounter for closed fracture: Secondary | ICD-10-CM

## 2021-10-24 NOTE — Progress Notes (Signed)
[facility] Return  Assessment: Denise Mendoza is a 88 y.o. female with the following: Right small finger, proximal and middle phalanx fractures  Plan: Repeat radiographs are stable.  She has advanced degenerative changes, but she has no pain on physical exam.  She is almost able to make a full fist.  I do not think that further immobilization is necessary.  Follow-up as needed  Follow-up: Return if symptoms worsen or fail to improve.   Subjective:  Chief Complaint  Patient presents with   Hand Injury    RT hand DOI 09/26/21/ splint keeps coming off no matter how it is wrapped    History of Present Illness: Denise Mendoza is a 88 y.o. female who presents to clinic for evaluation of right hand pain.  She sustained multiple fractures to the small finger on the right hand.  At the last visit, we placed her in an AlumaFoam splint.  She has had difficulty keeping this in place.  She has no pain in the right hand.  She has been using her hand as per normal.  No numbness or tingling.  Review of Systems: No fevers or chills No numbness or tingling No chest pain No shortness of breath No bowel or bladder dysfunction No GI distress No headaches   Objective: There were no vitals taken for this visit.  Physical Exam:  Elderly female.  Seated in a wheelchair.  Right hand without swelling.  Nodules consistent with arthritis diffusely throughout the hand.  She is almost able to make a full fist.  She has no tenderness to palpation along the small finger.  Warm and well-perfused.  IMAGING: I personally ordered and reviewed the following images:  X-ray of the right hand was obtained in clinic today.  These were compared to prior x-rays.  She has diffuse degenerative changes throughout all fingers.  She has fractures of the small finger proximal and middle phalanges.  Compared to prior x-rays, these are unchanged in alignment.  Impression: Right hand small finger proximal  and middle phalangeal fractures, in stable alignment   Mordecai Rasmussen, MD 10/24/2021 9:19 AM

## 2021-10-25 DIAGNOSIS — W19XXXA Unspecified fall, initial encounter: Secondary | ICD-10-CM | POA: Diagnosis not present

## 2021-10-25 DIAGNOSIS — R52 Pain, unspecified: Secondary | ICD-10-CM | POA: Diagnosis not present

## 2021-10-30 DIAGNOSIS — F411 Generalized anxiety disorder: Secondary | ICD-10-CM | POA: Diagnosis not present

## 2021-10-30 DIAGNOSIS — F331 Major depressive disorder, recurrent, moderate: Secondary | ICD-10-CM | POA: Diagnosis not present

## 2021-10-30 DIAGNOSIS — F01C11 Vascular dementia, severe, with agitation: Secondary | ICD-10-CM | POA: Diagnosis not present

## 2021-11-01 DIAGNOSIS — W19XXXA Unspecified fall, initial encounter: Secondary | ICD-10-CM | POA: Diagnosis not present

## 2021-11-01 DIAGNOSIS — R5381 Other malaise: Secondary | ICD-10-CM | POA: Diagnosis not present

## 2021-11-01 DIAGNOSIS — F32A Depression, unspecified: Secondary | ICD-10-CM | POA: Diagnosis not present

## 2021-11-01 DIAGNOSIS — F419 Anxiety disorder, unspecified: Secondary | ICD-10-CM | POA: Diagnosis not present

## 2021-11-12 DIAGNOSIS — R52 Pain, unspecified: Secondary | ICD-10-CM | POA: Diagnosis not present

## 2021-11-12 DIAGNOSIS — Z760 Encounter for issue of repeat prescription: Secondary | ICD-10-CM | POA: Diagnosis not present

## 2021-11-27 DIAGNOSIS — F01C11 Vascular dementia, severe, with agitation: Secondary | ICD-10-CM | POA: Diagnosis not present

## 2021-11-27 DIAGNOSIS — F331 Major depressive disorder, recurrent, moderate: Secondary | ICD-10-CM | POA: Diagnosis not present

## 2021-11-27 DIAGNOSIS — F411 Generalized anxiety disorder: Secondary | ICD-10-CM | POA: Diagnosis not present

## 2021-12-04 DIAGNOSIS — F32A Depression, unspecified: Secondary | ICD-10-CM | POA: Diagnosis not present

## 2021-12-04 DIAGNOSIS — E785 Hyperlipidemia, unspecified: Secondary | ICD-10-CM | POA: Diagnosis not present

## 2021-12-04 DIAGNOSIS — I10 Essential (primary) hypertension: Secondary | ICD-10-CM | POA: Diagnosis not present

## 2021-12-06 DIAGNOSIS — N390 Urinary tract infection, site not specified: Secondary | ICD-10-CM | POA: Diagnosis not present

## 2021-12-06 DIAGNOSIS — R918 Other nonspecific abnormal finding of lung field: Secondary | ICD-10-CM | POA: Diagnosis not present

## 2021-12-07 DIAGNOSIS — M6281 Muscle weakness (generalized): Secondary | ICD-10-CM | POA: Diagnosis not present

## 2021-12-07 DIAGNOSIS — R262 Difficulty in walking, not elsewhere classified: Secondary | ICD-10-CM | POA: Diagnosis not present

## 2021-12-10 DIAGNOSIS — R262 Difficulty in walking, not elsewhere classified: Secondary | ICD-10-CM | POA: Diagnosis not present

## 2021-12-10 DIAGNOSIS — M6281 Muscle weakness (generalized): Secondary | ICD-10-CM | POA: Diagnosis not present

## 2021-12-10 DIAGNOSIS — N390 Urinary tract infection, site not specified: Secondary | ICD-10-CM | POA: Diagnosis not present

## 2021-12-11 DIAGNOSIS — M6281 Muscle weakness (generalized): Secondary | ICD-10-CM | POA: Diagnosis not present

## 2021-12-11 DIAGNOSIS — R262 Difficulty in walking, not elsewhere classified: Secondary | ICD-10-CM | POA: Diagnosis not present

## 2021-12-12 DIAGNOSIS — M6281 Muscle weakness (generalized): Secondary | ICD-10-CM | POA: Diagnosis not present

## 2021-12-12 DIAGNOSIS — I10 Essential (primary) hypertension: Secondary | ICD-10-CM | POA: Diagnosis not present

## 2021-12-12 DIAGNOSIS — R262 Difficulty in walking, not elsewhere classified: Secondary | ICD-10-CM | POA: Diagnosis not present

## 2021-12-13 DIAGNOSIS — R531 Weakness: Secondary | ICD-10-CM | POA: Diagnosis not present

## 2021-12-13 DIAGNOSIS — F411 Generalized anxiety disorder: Secondary | ICD-10-CM | POA: Diagnosis not present

## 2021-12-13 DIAGNOSIS — W19XXXD Unspecified fall, subsequent encounter: Secondary | ICD-10-CM | POA: Diagnosis not present

## 2021-12-13 DIAGNOSIS — S6291XD Unspecified fracture of right wrist and hand, subsequent encounter for fracture with routine healing: Secondary | ICD-10-CM | POA: Diagnosis not present

## 2021-12-13 DIAGNOSIS — F0390 Unspecified dementia without behavioral disturbance: Secondary | ICD-10-CM | POA: Diagnosis not present

## 2021-12-13 DIAGNOSIS — R262 Difficulty in walking, not elsewhere classified: Secondary | ICD-10-CM | POA: Diagnosis not present

## 2021-12-13 DIAGNOSIS — E46 Unspecified protein-calorie malnutrition: Secondary | ICD-10-CM | POA: Diagnosis not present

## 2021-12-13 DIAGNOSIS — J439 Emphysema, unspecified: Secondary | ICD-10-CM | POA: Diagnosis not present

## 2021-12-13 DIAGNOSIS — F339 Major depressive disorder, recurrent, unspecified: Secondary | ICD-10-CM | POA: Diagnosis not present

## 2021-12-13 DIAGNOSIS — E785 Hyperlipidemia, unspecified: Secondary | ICD-10-CM | POA: Diagnosis not present

## 2021-12-13 DIAGNOSIS — M6281 Muscle weakness (generalized): Secondary | ICD-10-CM | POA: Diagnosis not present

## 2021-12-13 DIAGNOSIS — I10 Essential (primary) hypertension: Secondary | ICD-10-CM | POA: Diagnosis not present

## 2021-12-13 DIAGNOSIS — H543 Unqualified visual loss, both eyes: Secondary | ICD-10-CM | POA: Diagnosis not present

## 2021-12-14 DIAGNOSIS — S6291XD Unspecified fracture of right wrist and hand, subsequent encounter for fracture with routine healing: Secondary | ICD-10-CM | POA: Diagnosis not present

## 2021-12-14 DIAGNOSIS — F411 Generalized anxiety disorder: Secondary | ICD-10-CM | POA: Diagnosis not present

## 2021-12-14 DIAGNOSIS — F0390 Unspecified dementia without behavioral disturbance: Secondary | ICD-10-CM | POA: Diagnosis not present

## 2021-12-14 DIAGNOSIS — E785 Hyperlipidemia, unspecified: Secondary | ICD-10-CM | POA: Diagnosis not present

## 2021-12-14 DIAGNOSIS — E46 Unspecified protein-calorie malnutrition: Secondary | ICD-10-CM | POA: Diagnosis not present

## 2021-12-14 DIAGNOSIS — W19XXXD Unspecified fall, subsequent encounter: Secondary | ICD-10-CM | POA: Diagnosis not present

## 2021-12-17 DIAGNOSIS — F411 Generalized anxiety disorder: Secondary | ICD-10-CM | POA: Diagnosis not present

## 2021-12-17 DIAGNOSIS — W19XXXD Unspecified fall, subsequent encounter: Secondary | ICD-10-CM | POA: Diagnosis not present

## 2021-12-17 DIAGNOSIS — S6291XD Unspecified fracture of right wrist and hand, subsequent encounter for fracture with routine healing: Secondary | ICD-10-CM | POA: Diagnosis not present

## 2021-12-17 DIAGNOSIS — E46 Unspecified protein-calorie malnutrition: Secondary | ICD-10-CM | POA: Diagnosis not present

## 2021-12-17 DIAGNOSIS — E785 Hyperlipidemia, unspecified: Secondary | ICD-10-CM | POA: Diagnosis not present

## 2021-12-17 DIAGNOSIS — F0390 Unspecified dementia without behavioral disturbance: Secondary | ICD-10-CM | POA: Diagnosis not present

## 2021-12-18 DIAGNOSIS — F411 Generalized anxiety disorder: Secondary | ICD-10-CM | POA: Diagnosis not present

## 2021-12-18 DIAGNOSIS — E46 Unspecified protein-calorie malnutrition: Secondary | ICD-10-CM | POA: Diagnosis not present

## 2021-12-18 DIAGNOSIS — F331 Major depressive disorder, recurrent, moderate: Secondary | ICD-10-CM | POA: Diagnosis not present

## 2021-12-18 DIAGNOSIS — W19XXXD Unspecified fall, subsequent encounter: Secondary | ICD-10-CM | POA: Diagnosis not present

## 2021-12-18 DIAGNOSIS — S6291XD Unspecified fracture of right wrist and hand, subsequent encounter for fracture with routine healing: Secondary | ICD-10-CM | POA: Diagnosis not present

## 2021-12-18 DIAGNOSIS — F0390 Unspecified dementia without behavioral disturbance: Secondary | ICD-10-CM | POA: Diagnosis not present

## 2021-12-18 DIAGNOSIS — E785 Hyperlipidemia, unspecified: Secondary | ICD-10-CM | POA: Diagnosis not present

## 2021-12-18 DIAGNOSIS — F01C11 Vascular dementia, severe, with agitation: Secondary | ICD-10-CM | POA: Diagnosis not present

## 2021-12-20 DIAGNOSIS — F411 Generalized anxiety disorder: Secondary | ICD-10-CM | POA: Diagnosis not present

## 2021-12-20 DIAGNOSIS — F0390 Unspecified dementia without behavioral disturbance: Secondary | ICD-10-CM | POA: Diagnosis not present

## 2021-12-20 DIAGNOSIS — S6291XD Unspecified fracture of right wrist and hand, subsequent encounter for fracture with routine healing: Secondary | ICD-10-CM | POA: Diagnosis not present

## 2021-12-20 DIAGNOSIS — W19XXXD Unspecified fall, subsequent encounter: Secondary | ICD-10-CM | POA: Diagnosis not present

## 2021-12-20 DIAGNOSIS — E785 Hyperlipidemia, unspecified: Secondary | ICD-10-CM | POA: Diagnosis not present

## 2021-12-20 DIAGNOSIS — E46 Unspecified protein-calorie malnutrition: Secondary | ICD-10-CM | POA: Diagnosis not present

## 2021-12-22 DIAGNOSIS — W19XXXD Unspecified fall, subsequent encounter: Secondary | ICD-10-CM | POA: Diagnosis not present

## 2021-12-22 DIAGNOSIS — E46 Unspecified protein-calorie malnutrition: Secondary | ICD-10-CM | POA: Diagnosis not present

## 2021-12-22 DIAGNOSIS — E785 Hyperlipidemia, unspecified: Secondary | ICD-10-CM | POA: Diagnosis not present

## 2021-12-22 DIAGNOSIS — S6291XD Unspecified fracture of right wrist and hand, subsequent encounter for fracture with routine healing: Secondary | ICD-10-CM | POA: Diagnosis not present

## 2021-12-22 DIAGNOSIS — F0390 Unspecified dementia without behavioral disturbance: Secondary | ICD-10-CM | POA: Diagnosis not present

## 2021-12-22 DIAGNOSIS — F411 Generalized anxiety disorder: Secondary | ICD-10-CM | POA: Diagnosis not present

## 2021-12-25 DIAGNOSIS — S6291XD Unspecified fracture of right wrist and hand, subsequent encounter for fracture with routine healing: Secondary | ICD-10-CM | POA: Diagnosis not present

## 2021-12-25 DIAGNOSIS — E46 Unspecified protein-calorie malnutrition: Secondary | ICD-10-CM | POA: Diagnosis not present

## 2021-12-25 DIAGNOSIS — E785 Hyperlipidemia, unspecified: Secondary | ICD-10-CM | POA: Diagnosis not present

## 2021-12-25 DIAGNOSIS — W19XXXD Unspecified fall, subsequent encounter: Secondary | ICD-10-CM | POA: Diagnosis not present

## 2021-12-25 DIAGNOSIS — F411 Generalized anxiety disorder: Secondary | ICD-10-CM | POA: Diagnosis not present

## 2021-12-25 DIAGNOSIS — F0390 Unspecified dementia without behavioral disturbance: Secondary | ICD-10-CM | POA: Diagnosis not present

## 2021-12-27 DIAGNOSIS — E785 Hyperlipidemia, unspecified: Secondary | ICD-10-CM | POA: Diagnosis not present

## 2021-12-27 DIAGNOSIS — E46 Unspecified protein-calorie malnutrition: Secondary | ICD-10-CM | POA: Diagnosis not present

## 2021-12-27 DIAGNOSIS — F0390 Unspecified dementia without behavioral disturbance: Secondary | ICD-10-CM | POA: Diagnosis not present

## 2021-12-27 DIAGNOSIS — S6291XD Unspecified fracture of right wrist and hand, subsequent encounter for fracture with routine healing: Secondary | ICD-10-CM | POA: Diagnosis not present

## 2021-12-27 DIAGNOSIS — F411 Generalized anxiety disorder: Secondary | ICD-10-CM | POA: Diagnosis not present

## 2021-12-27 DIAGNOSIS — W19XXXD Unspecified fall, subsequent encounter: Secondary | ICD-10-CM | POA: Diagnosis not present

## 2021-12-29 DIAGNOSIS — F09 Unspecified mental disorder due to known physiological condition: Secondary | ICD-10-CM | POA: Diagnosis not present

## 2021-12-31 DIAGNOSIS — E785 Hyperlipidemia, unspecified: Secondary | ICD-10-CM | POA: Diagnosis not present

## 2021-12-31 DIAGNOSIS — W19XXXD Unspecified fall, subsequent encounter: Secondary | ICD-10-CM | POA: Diagnosis not present

## 2021-12-31 DIAGNOSIS — S6291XD Unspecified fracture of right wrist and hand, subsequent encounter for fracture with routine healing: Secondary | ICD-10-CM | POA: Diagnosis not present

## 2021-12-31 DIAGNOSIS — E46 Unspecified protein-calorie malnutrition: Secondary | ICD-10-CM | POA: Diagnosis not present

## 2021-12-31 DIAGNOSIS — F411 Generalized anxiety disorder: Secondary | ICD-10-CM | POA: Diagnosis not present

## 2021-12-31 DIAGNOSIS — F0390 Unspecified dementia without behavioral disturbance: Secondary | ICD-10-CM | POA: Diagnosis not present

## 2022-01-01 DIAGNOSIS — F411 Generalized anxiety disorder: Secondary | ICD-10-CM | POA: Diagnosis not present

## 2022-01-01 DIAGNOSIS — E46 Unspecified protein-calorie malnutrition: Secondary | ICD-10-CM | POA: Diagnosis not present

## 2022-01-01 DIAGNOSIS — W19XXXD Unspecified fall, subsequent encounter: Secondary | ICD-10-CM | POA: Diagnosis not present

## 2022-01-01 DIAGNOSIS — I10 Essential (primary) hypertension: Secondary | ICD-10-CM | POA: Diagnosis not present

## 2022-01-01 DIAGNOSIS — R262 Difficulty in walking, not elsewhere classified: Secondary | ICD-10-CM | POA: Diagnosis not present

## 2022-01-01 DIAGNOSIS — S6291XD Unspecified fracture of right wrist and hand, subsequent encounter for fracture with routine healing: Secondary | ICD-10-CM | POA: Diagnosis not present

## 2022-01-01 DIAGNOSIS — F339 Major depressive disorder, recurrent, unspecified: Secondary | ICD-10-CM | POA: Diagnosis not present

## 2022-01-01 DIAGNOSIS — J439 Emphysema, unspecified: Secondary | ICD-10-CM | POA: Diagnosis not present

## 2022-01-01 DIAGNOSIS — R531 Weakness: Secondary | ICD-10-CM | POA: Diagnosis not present

## 2022-01-01 DIAGNOSIS — M6281 Muscle weakness (generalized): Secondary | ICD-10-CM | POA: Diagnosis not present

## 2022-01-01 DIAGNOSIS — E785 Hyperlipidemia, unspecified: Secondary | ICD-10-CM | POA: Diagnosis not present

## 2022-01-01 DIAGNOSIS — H543 Unqualified visual loss, both eyes: Secondary | ICD-10-CM | POA: Diagnosis not present

## 2022-01-01 DIAGNOSIS — F0390 Unspecified dementia without behavioral disturbance: Secondary | ICD-10-CM | POA: Diagnosis not present

## 2022-01-03 DIAGNOSIS — F411 Generalized anxiety disorder: Secondary | ICD-10-CM | POA: Diagnosis not present

## 2022-01-03 DIAGNOSIS — S6291XD Unspecified fracture of right wrist and hand, subsequent encounter for fracture with routine healing: Secondary | ICD-10-CM | POA: Diagnosis not present

## 2022-01-03 DIAGNOSIS — E46 Unspecified protein-calorie malnutrition: Secondary | ICD-10-CM | POA: Diagnosis not present

## 2022-01-03 DIAGNOSIS — W19XXXD Unspecified fall, subsequent encounter: Secondary | ICD-10-CM | POA: Diagnosis not present

## 2022-01-03 DIAGNOSIS — F0390 Unspecified dementia without behavioral disturbance: Secondary | ICD-10-CM | POA: Diagnosis not present

## 2022-01-03 DIAGNOSIS — E785 Hyperlipidemia, unspecified: Secondary | ICD-10-CM | POA: Diagnosis not present

## 2022-01-08 DIAGNOSIS — S6291XD Unspecified fracture of right wrist and hand, subsequent encounter for fracture with routine healing: Secondary | ICD-10-CM | POA: Diagnosis not present

## 2022-01-08 DIAGNOSIS — W19XXXD Unspecified fall, subsequent encounter: Secondary | ICD-10-CM | POA: Diagnosis not present

## 2022-01-08 DIAGNOSIS — F0390 Unspecified dementia without behavioral disturbance: Secondary | ICD-10-CM | POA: Diagnosis not present

## 2022-01-08 DIAGNOSIS — E785 Hyperlipidemia, unspecified: Secondary | ICD-10-CM | POA: Diagnosis not present

## 2022-01-08 DIAGNOSIS — F411 Generalized anxiety disorder: Secondary | ICD-10-CM | POA: Diagnosis not present

## 2022-01-08 DIAGNOSIS — E46 Unspecified protein-calorie malnutrition: Secondary | ICD-10-CM | POA: Diagnosis not present

## 2022-01-10 DIAGNOSIS — F411 Generalized anxiety disorder: Secondary | ICD-10-CM | POA: Diagnosis not present

## 2022-01-10 DIAGNOSIS — E46 Unspecified protein-calorie malnutrition: Secondary | ICD-10-CM | POA: Diagnosis not present

## 2022-01-10 DIAGNOSIS — W19XXXD Unspecified fall, subsequent encounter: Secondary | ICD-10-CM | POA: Diagnosis not present

## 2022-01-10 DIAGNOSIS — E785 Hyperlipidemia, unspecified: Secondary | ICD-10-CM | POA: Diagnosis not present

## 2022-01-10 DIAGNOSIS — S6291XD Unspecified fracture of right wrist and hand, subsequent encounter for fracture with routine healing: Secondary | ICD-10-CM | POA: Diagnosis not present

## 2022-01-10 DIAGNOSIS — F0390 Unspecified dementia without behavioral disturbance: Secondary | ICD-10-CM | POA: Diagnosis not present

## 2022-01-14 DIAGNOSIS — F411 Generalized anxiety disorder: Secondary | ICD-10-CM | POA: Diagnosis not present

## 2022-01-14 DIAGNOSIS — F0390 Unspecified dementia without behavioral disturbance: Secondary | ICD-10-CM | POA: Diagnosis not present

## 2022-01-14 DIAGNOSIS — E46 Unspecified protein-calorie malnutrition: Secondary | ICD-10-CM | POA: Diagnosis not present

## 2022-01-14 DIAGNOSIS — E785 Hyperlipidemia, unspecified: Secondary | ICD-10-CM | POA: Diagnosis not present

## 2022-01-14 DIAGNOSIS — S6291XD Unspecified fracture of right wrist and hand, subsequent encounter for fracture with routine healing: Secondary | ICD-10-CM | POA: Diagnosis not present

## 2022-01-14 DIAGNOSIS — W19XXXD Unspecified fall, subsequent encounter: Secondary | ICD-10-CM | POA: Diagnosis not present

## 2022-01-15 DIAGNOSIS — F411 Generalized anxiety disorder: Secondary | ICD-10-CM | POA: Diagnosis not present

## 2022-01-15 DIAGNOSIS — F331 Major depressive disorder, recurrent, moderate: Secondary | ICD-10-CM | POA: Diagnosis not present

## 2022-01-15 DIAGNOSIS — F0390 Unspecified dementia without behavioral disturbance: Secondary | ICD-10-CM | POA: Diagnosis not present

## 2022-01-15 DIAGNOSIS — F01C11 Vascular dementia, severe, with agitation: Secondary | ICD-10-CM | POA: Diagnosis not present

## 2022-01-15 DIAGNOSIS — S6291XD Unspecified fracture of right wrist and hand, subsequent encounter for fracture with routine healing: Secondary | ICD-10-CM | POA: Diagnosis not present

## 2022-01-15 DIAGNOSIS — E785 Hyperlipidemia, unspecified: Secondary | ICD-10-CM | POA: Diagnosis not present

## 2022-01-15 DIAGNOSIS — I7091 Generalized atherosclerosis: Secondary | ICD-10-CM | POA: Diagnosis not present

## 2022-01-15 DIAGNOSIS — E46 Unspecified protein-calorie malnutrition: Secondary | ICD-10-CM | POA: Diagnosis not present

## 2022-01-15 DIAGNOSIS — B351 Tinea unguium: Secondary | ICD-10-CM | POA: Diagnosis not present

## 2022-01-15 DIAGNOSIS — W19XXXD Unspecified fall, subsequent encounter: Secondary | ICD-10-CM | POA: Diagnosis not present

## 2022-01-15 DIAGNOSIS — F09 Unspecified mental disorder due to known physiological condition: Secondary | ICD-10-CM | POA: Diagnosis not present

## 2022-01-16 DIAGNOSIS — S6291XD Unspecified fracture of right wrist and hand, subsequent encounter for fracture with routine healing: Secondary | ICD-10-CM | POA: Diagnosis not present

## 2022-01-16 DIAGNOSIS — F0390 Unspecified dementia without behavioral disturbance: Secondary | ICD-10-CM | POA: Diagnosis not present

## 2022-01-16 DIAGNOSIS — E46 Unspecified protein-calorie malnutrition: Secondary | ICD-10-CM | POA: Diagnosis not present

## 2022-01-16 DIAGNOSIS — F411 Generalized anxiety disorder: Secondary | ICD-10-CM | POA: Diagnosis not present

## 2022-01-16 DIAGNOSIS — W19XXXD Unspecified fall, subsequent encounter: Secondary | ICD-10-CM | POA: Diagnosis not present

## 2022-01-16 DIAGNOSIS — E785 Hyperlipidemia, unspecified: Secondary | ICD-10-CM | POA: Diagnosis not present

## 2022-01-17 DIAGNOSIS — E46 Unspecified protein-calorie malnutrition: Secondary | ICD-10-CM | POA: Diagnosis not present

## 2022-01-17 DIAGNOSIS — S6291XD Unspecified fracture of right wrist and hand, subsequent encounter for fracture with routine healing: Secondary | ICD-10-CM | POA: Diagnosis not present

## 2022-01-17 DIAGNOSIS — E785 Hyperlipidemia, unspecified: Secondary | ICD-10-CM | POA: Diagnosis not present

## 2022-01-17 DIAGNOSIS — W19XXXD Unspecified fall, subsequent encounter: Secondary | ICD-10-CM | POA: Diagnosis not present

## 2022-01-17 DIAGNOSIS — I10 Essential (primary) hypertension: Secondary | ICD-10-CM | POA: Diagnosis not present

## 2022-01-17 DIAGNOSIS — R52 Pain, unspecified: Secondary | ICD-10-CM | POA: Diagnosis not present

## 2022-01-17 DIAGNOSIS — F411 Generalized anxiety disorder: Secondary | ICD-10-CM | POA: Diagnosis not present

## 2022-01-17 DIAGNOSIS — F0390 Unspecified dementia without behavioral disturbance: Secondary | ICD-10-CM | POA: Diagnosis not present

## 2022-01-21 DIAGNOSIS — E785 Hyperlipidemia, unspecified: Secondary | ICD-10-CM | POA: Diagnosis not present

## 2022-01-21 DIAGNOSIS — F0390 Unspecified dementia without behavioral disturbance: Secondary | ICD-10-CM | POA: Diagnosis not present

## 2022-01-21 DIAGNOSIS — E46 Unspecified protein-calorie malnutrition: Secondary | ICD-10-CM | POA: Diagnosis not present

## 2022-01-21 DIAGNOSIS — S6291XD Unspecified fracture of right wrist and hand, subsequent encounter for fracture with routine healing: Secondary | ICD-10-CM | POA: Diagnosis not present

## 2022-01-21 DIAGNOSIS — W19XXXD Unspecified fall, subsequent encounter: Secondary | ICD-10-CM | POA: Diagnosis not present

## 2022-01-21 DIAGNOSIS — F411 Generalized anxiety disorder: Secondary | ICD-10-CM | POA: Diagnosis not present

## 2022-01-22 DIAGNOSIS — E46 Unspecified protein-calorie malnutrition: Secondary | ICD-10-CM | POA: Diagnosis not present

## 2022-01-22 DIAGNOSIS — W19XXXD Unspecified fall, subsequent encounter: Secondary | ICD-10-CM | POA: Diagnosis not present

## 2022-01-22 DIAGNOSIS — S6291XD Unspecified fracture of right wrist and hand, subsequent encounter for fracture with routine healing: Secondary | ICD-10-CM | POA: Diagnosis not present

## 2022-01-22 DIAGNOSIS — F0390 Unspecified dementia without behavioral disturbance: Secondary | ICD-10-CM | POA: Diagnosis not present

## 2022-01-22 DIAGNOSIS — F411 Generalized anxiety disorder: Secondary | ICD-10-CM | POA: Diagnosis not present

## 2022-01-22 DIAGNOSIS — E785 Hyperlipidemia, unspecified: Secondary | ICD-10-CM | POA: Diagnosis not present

## 2022-01-23 DIAGNOSIS — W19XXXD Unspecified fall, subsequent encounter: Secondary | ICD-10-CM | POA: Diagnosis not present

## 2022-01-23 DIAGNOSIS — S6291XD Unspecified fracture of right wrist and hand, subsequent encounter for fracture with routine healing: Secondary | ICD-10-CM | POA: Diagnosis not present

## 2022-01-23 DIAGNOSIS — F411 Generalized anxiety disorder: Secondary | ICD-10-CM | POA: Diagnosis not present

## 2022-01-23 DIAGNOSIS — F0390 Unspecified dementia without behavioral disturbance: Secondary | ICD-10-CM | POA: Diagnosis not present

## 2022-01-23 DIAGNOSIS — F09 Unspecified mental disorder due to known physiological condition: Secondary | ICD-10-CM | POA: Diagnosis not present

## 2022-01-23 DIAGNOSIS — E785 Hyperlipidemia, unspecified: Secondary | ICD-10-CM | POA: Diagnosis not present

## 2022-01-23 DIAGNOSIS — E46 Unspecified protein-calorie malnutrition: Secondary | ICD-10-CM | POA: Diagnosis not present

## 2022-01-24 DIAGNOSIS — W19XXXD Unspecified fall, subsequent encounter: Secondary | ICD-10-CM | POA: Diagnosis not present

## 2022-01-24 DIAGNOSIS — E46 Unspecified protein-calorie malnutrition: Secondary | ICD-10-CM | POA: Diagnosis not present

## 2022-01-24 DIAGNOSIS — F411 Generalized anxiety disorder: Secondary | ICD-10-CM | POA: Diagnosis not present

## 2022-01-24 DIAGNOSIS — E785 Hyperlipidemia, unspecified: Secondary | ICD-10-CM | POA: Diagnosis not present

## 2022-01-24 DIAGNOSIS — S6291XD Unspecified fracture of right wrist and hand, subsequent encounter for fracture with routine healing: Secondary | ICD-10-CM | POA: Diagnosis not present

## 2022-01-24 DIAGNOSIS — F0390 Unspecified dementia without behavioral disturbance: Secondary | ICD-10-CM | POA: Diagnosis not present

## 2022-01-29 DIAGNOSIS — E46 Unspecified protein-calorie malnutrition: Secondary | ICD-10-CM | POA: Diagnosis not present

## 2022-01-29 DIAGNOSIS — W19XXXD Unspecified fall, subsequent encounter: Secondary | ICD-10-CM | POA: Diagnosis not present

## 2022-01-29 DIAGNOSIS — F01C11 Vascular dementia, severe, with agitation: Secondary | ICD-10-CM | POA: Diagnosis not present

## 2022-01-29 DIAGNOSIS — S6291XD Unspecified fracture of right wrist and hand, subsequent encounter for fracture with routine healing: Secondary | ICD-10-CM | POA: Diagnosis not present

## 2022-01-29 DIAGNOSIS — F411 Generalized anxiety disorder: Secondary | ICD-10-CM | POA: Diagnosis not present

## 2022-01-29 DIAGNOSIS — F331 Major depressive disorder, recurrent, moderate: Secondary | ICD-10-CM | POA: Diagnosis not present

## 2022-01-29 DIAGNOSIS — F0390 Unspecified dementia without behavioral disturbance: Secondary | ICD-10-CM | POA: Diagnosis not present

## 2022-01-29 DIAGNOSIS — E785 Hyperlipidemia, unspecified: Secondary | ICD-10-CM | POA: Diagnosis not present

## 2022-01-31 DIAGNOSIS — S6291XD Unspecified fracture of right wrist and hand, subsequent encounter for fracture with routine healing: Secondary | ICD-10-CM | POA: Diagnosis not present

## 2022-01-31 DIAGNOSIS — F411 Generalized anxiety disorder: Secondary | ICD-10-CM | POA: Diagnosis not present

## 2022-01-31 DIAGNOSIS — E785 Hyperlipidemia, unspecified: Secondary | ICD-10-CM | POA: Diagnosis not present

## 2022-01-31 DIAGNOSIS — E46 Unspecified protein-calorie malnutrition: Secondary | ICD-10-CM | POA: Diagnosis not present

## 2022-01-31 DIAGNOSIS — W19XXXD Unspecified fall, subsequent encounter: Secondary | ICD-10-CM | POA: Diagnosis not present

## 2022-01-31 DIAGNOSIS — F0390 Unspecified dementia without behavioral disturbance: Secondary | ICD-10-CM | POA: Diagnosis not present

## 2022-02-01 DIAGNOSIS — W19XXXD Unspecified fall, subsequent encounter: Secondary | ICD-10-CM | POA: Diagnosis not present

## 2022-02-01 DIAGNOSIS — J439 Emphysema, unspecified: Secondary | ICD-10-CM | POA: Diagnosis not present

## 2022-02-01 DIAGNOSIS — R531 Weakness: Secondary | ICD-10-CM | POA: Diagnosis not present

## 2022-02-01 DIAGNOSIS — M6281 Muscle weakness (generalized): Secondary | ICD-10-CM | POA: Diagnosis not present

## 2022-02-01 DIAGNOSIS — E46 Unspecified protein-calorie malnutrition: Secondary | ICD-10-CM | POA: Diagnosis not present

## 2022-02-01 DIAGNOSIS — F411 Generalized anxiety disorder: Secondary | ICD-10-CM | POA: Diagnosis not present

## 2022-02-01 DIAGNOSIS — S6291XD Unspecified fracture of right wrist and hand, subsequent encounter for fracture with routine healing: Secondary | ICD-10-CM | POA: Diagnosis not present

## 2022-02-01 DIAGNOSIS — R262 Difficulty in walking, not elsewhere classified: Secondary | ICD-10-CM | POA: Diagnosis not present

## 2022-02-01 DIAGNOSIS — I10 Essential (primary) hypertension: Secondary | ICD-10-CM | POA: Diagnosis not present

## 2022-02-01 DIAGNOSIS — H543 Unqualified visual loss, both eyes: Secondary | ICD-10-CM | POA: Diagnosis not present

## 2022-02-01 DIAGNOSIS — E785 Hyperlipidemia, unspecified: Secondary | ICD-10-CM | POA: Diagnosis not present

## 2022-02-01 DIAGNOSIS — F339 Major depressive disorder, recurrent, unspecified: Secondary | ICD-10-CM | POA: Diagnosis not present

## 2022-02-01 DIAGNOSIS — F0390 Unspecified dementia without behavioral disturbance: Secondary | ICD-10-CM | POA: Diagnosis not present

## 2022-02-05 DIAGNOSIS — W19XXXD Unspecified fall, subsequent encounter: Secondary | ICD-10-CM | POA: Diagnosis not present

## 2022-02-05 DIAGNOSIS — S6291XD Unspecified fracture of right wrist and hand, subsequent encounter for fracture with routine healing: Secondary | ICD-10-CM | POA: Diagnosis not present

## 2022-02-05 DIAGNOSIS — H548 Legal blindness, as defined in USA: Secondary | ICD-10-CM | POA: Diagnosis not present

## 2022-02-05 DIAGNOSIS — F411 Generalized anxiety disorder: Secondary | ICD-10-CM | POA: Diagnosis not present

## 2022-02-05 DIAGNOSIS — E46 Unspecified protein-calorie malnutrition: Secondary | ICD-10-CM | POA: Diagnosis not present

## 2022-02-05 DIAGNOSIS — H353133 Nonexudative age-related macular degeneration, bilateral, advanced atrophic without subfoveal involvement: Secondary | ICD-10-CM | POA: Diagnosis not present

## 2022-02-05 DIAGNOSIS — F0390 Unspecified dementia without behavioral disturbance: Secondary | ICD-10-CM | POA: Diagnosis not present

## 2022-02-05 DIAGNOSIS — Z961 Presence of intraocular lens: Secondary | ICD-10-CM | POA: Diagnosis not present

## 2022-02-05 DIAGNOSIS — E785 Hyperlipidemia, unspecified: Secondary | ICD-10-CM | POA: Diagnosis not present

## 2022-02-07 DIAGNOSIS — E785 Hyperlipidemia, unspecified: Secondary | ICD-10-CM | POA: Diagnosis not present

## 2022-02-07 DIAGNOSIS — S6291XD Unspecified fracture of right wrist and hand, subsequent encounter for fracture with routine healing: Secondary | ICD-10-CM | POA: Diagnosis not present

## 2022-02-07 DIAGNOSIS — E46 Unspecified protein-calorie malnutrition: Secondary | ICD-10-CM | POA: Diagnosis not present

## 2022-02-07 DIAGNOSIS — F0390 Unspecified dementia without behavioral disturbance: Secondary | ICD-10-CM | POA: Diagnosis not present

## 2022-02-07 DIAGNOSIS — F411 Generalized anxiety disorder: Secondary | ICD-10-CM | POA: Diagnosis not present

## 2022-02-07 DIAGNOSIS — W19XXXD Unspecified fall, subsequent encounter: Secondary | ICD-10-CM | POA: Diagnosis not present

## 2022-02-11 DIAGNOSIS — F0390 Unspecified dementia without behavioral disturbance: Secondary | ICD-10-CM | POA: Diagnosis not present

## 2022-02-11 DIAGNOSIS — E785 Hyperlipidemia, unspecified: Secondary | ICD-10-CM | POA: Diagnosis not present

## 2022-02-11 DIAGNOSIS — W19XXXD Unspecified fall, subsequent encounter: Secondary | ICD-10-CM | POA: Diagnosis not present

## 2022-02-11 DIAGNOSIS — F411 Generalized anxiety disorder: Secondary | ICD-10-CM | POA: Diagnosis not present

## 2022-02-11 DIAGNOSIS — S6291XD Unspecified fracture of right wrist and hand, subsequent encounter for fracture with routine healing: Secondary | ICD-10-CM | POA: Diagnosis not present

## 2022-02-11 DIAGNOSIS — E46 Unspecified protein-calorie malnutrition: Secondary | ICD-10-CM | POA: Diagnosis not present

## 2022-02-12 DIAGNOSIS — F331 Major depressive disorder, recurrent, moderate: Secondary | ICD-10-CM | POA: Diagnosis not present

## 2022-02-12 DIAGNOSIS — E785 Hyperlipidemia, unspecified: Secondary | ICD-10-CM | POA: Diagnosis not present

## 2022-02-12 DIAGNOSIS — F09 Unspecified mental disorder due to known physiological condition: Secondary | ICD-10-CM | POA: Diagnosis not present

## 2022-02-12 DIAGNOSIS — S6291XD Unspecified fracture of right wrist and hand, subsequent encounter for fracture with routine healing: Secondary | ICD-10-CM | POA: Diagnosis not present

## 2022-02-12 DIAGNOSIS — F411 Generalized anxiety disorder: Secondary | ICD-10-CM | POA: Diagnosis not present

## 2022-02-12 DIAGNOSIS — E46 Unspecified protein-calorie malnutrition: Secondary | ICD-10-CM | POA: Diagnosis not present

## 2022-02-12 DIAGNOSIS — F0390 Unspecified dementia without behavioral disturbance: Secondary | ICD-10-CM | POA: Diagnosis not present

## 2022-02-12 DIAGNOSIS — W19XXXD Unspecified fall, subsequent encounter: Secondary | ICD-10-CM | POA: Diagnosis not present

## 2022-02-12 DIAGNOSIS — F01C11 Vascular dementia, severe, with agitation: Secondary | ICD-10-CM | POA: Diagnosis not present

## 2022-02-14 DIAGNOSIS — S6291XD Unspecified fracture of right wrist and hand, subsequent encounter for fracture with routine healing: Secondary | ICD-10-CM | POA: Diagnosis not present

## 2022-02-14 DIAGNOSIS — E46 Unspecified protein-calorie malnutrition: Secondary | ICD-10-CM | POA: Diagnosis not present

## 2022-02-14 DIAGNOSIS — F411 Generalized anxiety disorder: Secondary | ICD-10-CM | POA: Diagnosis not present

## 2022-02-14 DIAGNOSIS — F0390 Unspecified dementia without behavioral disturbance: Secondary | ICD-10-CM | POA: Diagnosis not present

## 2022-02-14 DIAGNOSIS — E785 Hyperlipidemia, unspecified: Secondary | ICD-10-CM | POA: Diagnosis not present

## 2022-02-14 DIAGNOSIS — W19XXXD Unspecified fall, subsequent encounter: Secondary | ICD-10-CM | POA: Diagnosis not present

## 2022-02-15 DIAGNOSIS — Z760 Encounter for issue of repeat prescription: Secondary | ICD-10-CM | POA: Diagnosis not present

## 2022-02-15 DIAGNOSIS — R296 Repeated falls: Secondary | ICD-10-CM | POA: Diagnosis not present

## 2022-02-15 DIAGNOSIS — W19XXXA Unspecified fall, initial encounter: Secondary | ICD-10-CM | POA: Diagnosis not present

## 2022-02-15 DIAGNOSIS — I10 Essential (primary) hypertension: Secondary | ICD-10-CM | POA: Diagnosis not present

## 2022-02-15 DIAGNOSIS — F32A Depression, unspecified: Secondary | ICD-10-CM | POA: Diagnosis not present

## 2022-02-19 DIAGNOSIS — F0390 Unspecified dementia without behavioral disturbance: Secondary | ICD-10-CM | POA: Diagnosis not present

## 2022-02-19 DIAGNOSIS — E46 Unspecified protein-calorie malnutrition: Secondary | ICD-10-CM | POA: Diagnosis not present

## 2022-02-19 DIAGNOSIS — E785 Hyperlipidemia, unspecified: Secondary | ICD-10-CM | POA: Diagnosis not present

## 2022-02-19 DIAGNOSIS — S6291XD Unspecified fracture of right wrist and hand, subsequent encounter for fracture with routine healing: Secondary | ICD-10-CM | POA: Diagnosis not present

## 2022-02-19 DIAGNOSIS — W19XXXD Unspecified fall, subsequent encounter: Secondary | ICD-10-CM | POA: Diagnosis not present

## 2022-02-19 DIAGNOSIS — F411 Generalized anxiety disorder: Secondary | ICD-10-CM | POA: Diagnosis not present

## 2022-02-21 DIAGNOSIS — E785 Hyperlipidemia, unspecified: Secondary | ICD-10-CM | POA: Diagnosis not present

## 2022-02-21 DIAGNOSIS — F0390 Unspecified dementia without behavioral disturbance: Secondary | ICD-10-CM | POA: Diagnosis not present

## 2022-02-21 DIAGNOSIS — S6291XD Unspecified fracture of right wrist and hand, subsequent encounter for fracture with routine healing: Secondary | ICD-10-CM | POA: Diagnosis not present

## 2022-02-21 DIAGNOSIS — F411 Generalized anxiety disorder: Secondary | ICD-10-CM | POA: Diagnosis not present

## 2022-02-21 DIAGNOSIS — W19XXXD Unspecified fall, subsequent encounter: Secondary | ICD-10-CM | POA: Diagnosis not present

## 2022-02-21 DIAGNOSIS — E46 Unspecified protein-calorie malnutrition: Secondary | ICD-10-CM | POA: Diagnosis not present

## 2022-02-25 DIAGNOSIS — I10 Essential (primary) hypertension: Secondary | ICD-10-CM | POA: Diagnosis not present

## 2022-02-25 DIAGNOSIS — G894 Chronic pain syndrome: Secondary | ICD-10-CM | POA: Diagnosis not present

## 2022-02-26 DIAGNOSIS — S6291XD Unspecified fracture of right wrist and hand, subsequent encounter for fracture with routine healing: Secondary | ICD-10-CM | POA: Diagnosis not present

## 2022-02-26 DIAGNOSIS — E785 Hyperlipidemia, unspecified: Secondary | ICD-10-CM | POA: Diagnosis not present

## 2022-02-26 DIAGNOSIS — F411 Generalized anxiety disorder: Secondary | ICD-10-CM | POA: Diagnosis not present

## 2022-02-26 DIAGNOSIS — F09 Unspecified mental disorder due to known physiological condition: Secondary | ICD-10-CM | POA: Diagnosis not present

## 2022-02-26 DIAGNOSIS — F0390 Unspecified dementia without behavioral disturbance: Secondary | ICD-10-CM | POA: Diagnosis not present

## 2022-02-26 DIAGNOSIS — W19XXXD Unspecified fall, subsequent encounter: Secondary | ICD-10-CM | POA: Diagnosis not present

## 2022-02-26 DIAGNOSIS — E46 Unspecified protein-calorie malnutrition: Secondary | ICD-10-CM | POA: Diagnosis not present

## 2022-02-28 DIAGNOSIS — F0390 Unspecified dementia without behavioral disturbance: Secondary | ICD-10-CM | POA: Diagnosis not present

## 2022-02-28 DIAGNOSIS — W19XXXD Unspecified fall, subsequent encounter: Secondary | ICD-10-CM | POA: Diagnosis not present

## 2022-02-28 DIAGNOSIS — E785 Hyperlipidemia, unspecified: Secondary | ICD-10-CM | POA: Diagnosis not present

## 2022-02-28 DIAGNOSIS — F411 Generalized anxiety disorder: Secondary | ICD-10-CM | POA: Diagnosis not present

## 2022-02-28 DIAGNOSIS — S6291XD Unspecified fracture of right wrist and hand, subsequent encounter for fracture with routine healing: Secondary | ICD-10-CM | POA: Diagnosis not present

## 2022-02-28 DIAGNOSIS — E46 Unspecified protein-calorie malnutrition: Secondary | ICD-10-CM | POA: Diagnosis not present

## 2022-03-03 DIAGNOSIS — F0390 Unspecified dementia without behavioral disturbance: Secondary | ICD-10-CM | POA: Diagnosis not present

## 2022-03-03 DIAGNOSIS — F339 Major depressive disorder, recurrent, unspecified: Secondary | ICD-10-CM | POA: Diagnosis not present

## 2022-03-03 DIAGNOSIS — F411 Generalized anxiety disorder: Secondary | ICD-10-CM | POA: Diagnosis not present

## 2022-03-03 DIAGNOSIS — I10 Essential (primary) hypertension: Secondary | ICD-10-CM | POA: Diagnosis not present

## 2022-03-03 DIAGNOSIS — R531 Weakness: Secondary | ICD-10-CM | POA: Diagnosis not present

## 2022-03-03 DIAGNOSIS — H543 Unqualified visual loss, both eyes: Secondary | ICD-10-CM | POA: Diagnosis not present

## 2022-03-03 DIAGNOSIS — R262 Difficulty in walking, not elsewhere classified: Secondary | ICD-10-CM | POA: Diagnosis not present

## 2022-03-03 DIAGNOSIS — J439 Emphysema, unspecified: Secondary | ICD-10-CM | POA: Diagnosis not present

## 2022-03-03 DIAGNOSIS — S6291XD Unspecified fracture of right wrist and hand, subsequent encounter for fracture with routine healing: Secondary | ICD-10-CM | POA: Diagnosis not present

## 2022-03-03 DIAGNOSIS — E785 Hyperlipidemia, unspecified: Secondary | ICD-10-CM | POA: Diagnosis not present

## 2022-03-03 DIAGNOSIS — E46 Unspecified protein-calorie malnutrition: Secondary | ICD-10-CM | POA: Diagnosis not present

## 2022-03-03 DIAGNOSIS — W19XXXD Unspecified fall, subsequent encounter: Secondary | ICD-10-CM | POA: Diagnosis not present

## 2022-03-03 DIAGNOSIS — M6281 Muscle weakness (generalized): Secondary | ICD-10-CM | POA: Diagnosis not present

## 2022-03-04 DIAGNOSIS — W19XXXD Unspecified fall, subsequent encounter: Secondary | ICD-10-CM | POA: Diagnosis not present

## 2022-03-04 DIAGNOSIS — F419 Anxiety disorder, unspecified: Secondary | ICD-10-CM | POA: Diagnosis not present

## 2022-03-04 DIAGNOSIS — E785 Hyperlipidemia, unspecified: Secondary | ICD-10-CM | POA: Diagnosis not present

## 2022-03-04 DIAGNOSIS — S6291XD Unspecified fracture of right wrist and hand, subsequent encounter for fracture with routine healing: Secondary | ICD-10-CM | POA: Diagnosis not present

## 2022-03-04 DIAGNOSIS — I10 Essential (primary) hypertension: Secondary | ICD-10-CM | POA: Diagnosis not present

## 2022-03-04 DIAGNOSIS — F411 Generalized anxiety disorder: Secondary | ICD-10-CM | POA: Diagnosis not present

## 2022-03-04 DIAGNOSIS — K5900 Constipation, unspecified: Secondary | ICD-10-CM | POA: Diagnosis not present

## 2022-03-04 DIAGNOSIS — F32A Depression, unspecified: Secondary | ICD-10-CM | POA: Diagnosis not present

## 2022-03-04 DIAGNOSIS — F0390 Unspecified dementia without behavioral disturbance: Secondary | ICD-10-CM | POA: Diagnosis not present

## 2022-03-04 DIAGNOSIS — E46 Unspecified protein-calorie malnutrition: Secondary | ICD-10-CM | POA: Diagnosis not present

## 2022-03-05 DIAGNOSIS — I10 Essential (primary) hypertension: Secondary | ICD-10-CM | POA: Diagnosis not present

## 2022-03-05 DIAGNOSIS — S6291XD Unspecified fracture of right wrist and hand, subsequent encounter for fracture with routine healing: Secondary | ICD-10-CM | POA: Diagnosis not present

## 2022-03-05 DIAGNOSIS — F01C11 Vascular dementia, severe, with agitation: Secondary | ICD-10-CM | POA: Diagnosis not present

## 2022-03-05 DIAGNOSIS — E46 Unspecified protein-calorie malnutrition: Secondary | ICD-10-CM | POA: Diagnosis not present

## 2022-03-05 DIAGNOSIS — W19XXXD Unspecified fall, subsequent encounter: Secondary | ICD-10-CM | POA: Diagnosis not present

## 2022-03-05 DIAGNOSIS — F411 Generalized anxiety disorder: Secondary | ICD-10-CM | POA: Diagnosis not present

## 2022-03-05 DIAGNOSIS — F09 Unspecified mental disorder due to known physiological condition: Secondary | ICD-10-CM | POA: Diagnosis not present

## 2022-03-05 DIAGNOSIS — F331 Major depressive disorder, recurrent, moderate: Secondary | ICD-10-CM | POA: Diagnosis not present

## 2022-03-05 DIAGNOSIS — F0390 Unspecified dementia without behavioral disturbance: Secondary | ICD-10-CM | POA: Diagnosis not present

## 2022-03-05 DIAGNOSIS — E785 Hyperlipidemia, unspecified: Secondary | ICD-10-CM | POA: Diagnosis not present

## 2022-03-05 DIAGNOSIS — F419 Anxiety disorder, unspecified: Secondary | ICD-10-CM | POA: Diagnosis not present

## 2022-03-07 DIAGNOSIS — E46 Unspecified protein-calorie malnutrition: Secondary | ICD-10-CM | POA: Diagnosis not present

## 2022-03-07 DIAGNOSIS — W19XXXD Unspecified fall, subsequent encounter: Secondary | ICD-10-CM | POA: Diagnosis not present

## 2022-03-07 DIAGNOSIS — F411 Generalized anxiety disorder: Secondary | ICD-10-CM | POA: Diagnosis not present

## 2022-03-07 DIAGNOSIS — S6291XD Unspecified fracture of right wrist and hand, subsequent encounter for fracture with routine healing: Secondary | ICD-10-CM | POA: Diagnosis not present

## 2022-03-07 DIAGNOSIS — E785 Hyperlipidemia, unspecified: Secondary | ICD-10-CM | POA: Diagnosis not present

## 2022-03-07 DIAGNOSIS — F0390 Unspecified dementia without behavioral disturbance: Secondary | ICD-10-CM | POA: Diagnosis not present

## 2022-03-11 DIAGNOSIS — E559 Vitamin D deficiency, unspecified: Secondary | ICD-10-CM | POA: Diagnosis not present

## 2022-03-11 DIAGNOSIS — I10 Essential (primary) hypertension: Secondary | ICD-10-CM | POA: Diagnosis not present

## 2022-03-12 DIAGNOSIS — S6291XD Unspecified fracture of right wrist and hand, subsequent encounter for fracture with routine healing: Secondary | ICD-10-CM | POA: Diagnosis not present

## 2022-03-12 DIAGNOSIS — E785 Hyperlipidemia, unspecified: Secondary | ICD-10-CM | POA: Diagnosis not present

## 2022-03-12 DIAGNOSIS — F411 Generalized anxiety disorder: Secondary | ICD-10-CM | POA: Diagnosis not present

## 2022-03-12 DIAGNOSIS — E46 Unspecified protein-calorie malnutrition: Secondary | ICD-10-CM | POA: Diagnosis not present

## 2022-03-12 DIAGNOSIS — W19XXXD Unspecified fall, subsequent encounter: Secondary | ICD-10-CM | POA: Diagnosis not present

## 2022-03-12 DIAGNOSIS — F0390 Unspecified dementia without behavioral disturbance: Secondary | ICD-10-CM | POA: Diagnosis not present

## 2022-03-13 DIAGNOSIS — F09 Unspecified mental disorder due to known physiological condition: Secondary | ICD-10-CM | POA: Diagnosis not present

## 2022-03-14 DIAGNOSIS — F411 Generalized anxiety disorder: Secondary | ICD-10-CM | POA: Diagnosis not present

## 2022-03-14 DIAGNOSIS — E46 Unspecified protein-calorie malnutrition: Secondary | ICD-10-CM | POA: Diagnosis not present

## 2022-03-14 DIAGNOSIS — F0390 Unspecified dementia without behavioral disturbance: Secondary | ICD-10-CM | POA: Diagnosis not present

## 2022-03-14 DIAGNOSIS — E785 Hyperlipidemia, unspecified: Secondary | ICD-10-CM | POA: Diagnosis not present

## 2022-03-14 DIAGNOSIS — W19XXXD Unspecified fall, subsequent encounter: Secondary | ICD-10-CM | POA: Diagnosis not present

## 2022-03-14 DIAGNOSIS — S6291XD Unspecified fracture of right wrist and hand, subsequent encounter for fracture with routine healing: Secondary | ICD-10-CM | POA: Diagnosis not present

## 2022-03-19 DIAGNOSIS — S6291XD Unspecified fracture of right wrist and hand, subsequent encounter for fracture with routine healing: Secondary | ICD-10-CM | POA: Diagnosis not present

## 2022-03-19 DIAGNOSIS — F411 Generalized anxiety disorder: Secondary | ICD-10-CM | POA: Diagnosis not present

## 2022-03-19 DIAGNOSIS — F0390 Unspecified dementia without behavioral disturbance: Secondary | ICD-10-CM | POA: Diagnosis not present

## 2022-03-19 DIAGNOSIS — E785 Hyperlipidemia, unspecified: Secondary | ICD-10-CM | POA: Diagnosis not present

## 2022-03-19 DIAGNOSIS — E46 Unspecified protein-calorie malnutrition: Secondary | ICD-10-CM | POA: Diagnosis not present

## 2022-03-19 DIAGNOSIS — W19XXXD Unspecified fall, subsequent encounter: Secondary | ICD-10-CM | POA: Diagnosis not present

## 2022-03-20 DIAGNOSIS — F09 Unspecified mental disorder due to known physiological condition: Secondary | ICD-10-CM | POA: Diagnosis not present

## 2022-03-21 DIAGNOSIS — F411 Generalized anxiety disorder: Secondary | ICD-10-CM | POA: Diagnosis not present

## 2022-03-21 DIAGNOSIS — E46 Unspecified protein-calorie malnutrition: Secondary | ICD-10-CM | POA: Diagnosis not present

## 2022-03-21 DIAGNOSIS — W19XXXD Unspecified fall, subsequent encounter: Secondary | ICD-10-CM | POA: Diagnosis not present

## 2022-03-21 DIAGNOSIS — F0390 Unspecified dementia without behavioral disturbance: Secondary | ICD-10-CM | POA: Diagnosis not present

## 2022-03-21 DIAGNOSIS — E785 Hyperlipidemia, unspecified: Secondary | ICD-10-CM | POA: Diagnosis not present

## 2022-03-21 DIAGNOSIS — S6291XD Unspecified fracture of right wrist and hand, subsequent encounter for fracture with routine healing: Secondary | ICD-10-CM | POA: Diagnosis not present

## 2022-03-26 DIAGNOSIS — F411 Generalized anxiety disorder: Secondary | ICD-10-CM | POA: Diagnosis not present

## 2022-03-26 DIAGNOSIS — E785 Hyperlipidemia, unspecified: Secondary | ICD-10-CM | POA: Diagnosis not present

## 2022-03-26 DIAGNOSIS — E46 Unspecified protein-calorie malnutrition: Secondary | ICD-10-CM | POA: Diagnosis not present

## 2022-03-26 DIAGNOSIS — S6291XD Unspecified fracture of right wrist and hand, subsequent encounter for fracture with routine healing: Secondary | ICD-10-CM | POA: Diagnosis not present

## 2022-03-26 DIAGNOSIS — W19XXXD Unspecified fall, subsequent encounter: Secondary | ICD-10-CM | POA: Diagnosis not present

## 2022-03-26 DIAGNOSIS — F0390 Unspecified dementia without behavioral disturbance: Secondary | ICD-10-CM | POA: Diagnosis not present

## 2022-03-27 DIAGNOSIS — W19XXXD Unspecified fall, subsequent encounter: Secondary | ICD-10-CM | POA: Diagnosis not present

## 2022-03-27 DIAGNOSIS — S6291XD Unspecified fracture of right wrist and hand, subsequent encounter for fracture with routine healing: Secondary | ICD-10-CM | POA: Diagnosis not present

## 2022-03-27 DIAGNOSIS — F0390 Unspecified dementia without behavioral disturbance: Secondary | ICD-10-CM | POA: Diagnosis not present

## 2022-03-27 DIAGNOSIS — F411 Generalized anxiety disorder: Secondary | ICD-10-CM | POA: Diagnosis not present

## 2022-03-27 DIAGNOSIS — E785 Hyperlipidemia, unspecified: Secondary | ICD-10-CM | POA: Diagnosis not present

## 2022-03-27 DIAGNOSIS — F09 Unspecified mental disorder due to known physiological condition: Secondary | ICD-10-CM | POA: Diagnosis not present

## 2022-03-27 DIAGNOSIS — E46 Unspecified protein-calorie malnutrition: Secondary | ICD-10-CM | POA: Diagnosis not present

## 2022-03-28 DIAGNOSIS — F411 Generalized anxiety disorder: Secondary | ICD-10-CM | POA: Diagnosis not present

## 2022-03-28 DIAGNOSIS — W19XXXD Unspecified fall, subsequent encounter: Secondary | ICD-10-CM | POA: Diagnosis not present

## 2022-03-28 DIAGNOSIS — E785 Hyperlipidemia, unspecified: Secondary | ICD-10-CM | POA: Diagnosis not present

## 2022-03-28 DIAGNOSIS — E46 Unspecified protein-calorie malnutrition: Secondary | ICD-10-CM | POA: Diagnosis not present

## 2022-03-28 DIAGNOSIS — F0390 Unspecified dementia without behavioral disturbance: Secondary | ICD-10-CM | POA: Diagnosis not present

## 2022-03-28 DIAGNOSIS — S6291XD Unspecified fracture of right wrist and hand, subsequent encounter for fracture with routine healing: Secondary | ICD-10-CM | POA: Diagnosis not present

## 2022-04-01 DIAGNOSIS — I10 Essential (primary) hypertension: Secondary | ICD-10-CM | POA: Diagnosis not present

## 2022-04-01 DIAGNOSIS — F0390 Unspecified dementia without behavioral disturbance: Secondary | ICD-10-CM | POA: Diagnosis not present

## 2022-04-01 DIAGNOSIS — E785 Hyperlipidemia, unspecified: Secondary | ICD-10-CM | POA: Diagnosis not present

## 2022-04-01 DIAGNOSIS — E43 Unspecified severe protein-calorie malnutrition: Secondary | ICD-10-CM | POA: Diagnosis not present

## 2022-04-02 DIAGNOSIS — E46 Unspecified protein-calorie malnutrition: Secondary | ICD-10-CM | POA: Diagnosis not present

## 2022-04-02 DIAGNOSIS — F01C11 Vascular dementia, severe, with agitation: Secondary | ICD-10-CM | POA: Diagnosis not present

## 2022-04-02 DIAGNOSIS — F331 Major depressive disorder, recurrent, moderate: Secondary | ICD-10-CM | POA: Diagnosis not present

## 2022-04-02 DIAGNOSIS — S6291XD Unspecified fracture of right wrist and hand, subsequent encounter for fracture with routine healing: Secondary | ICD-10-CM | POA: Diagnosis not present

## 2022-04-02 DIAGNOSIS — E785 Hyperlipidemia, unspecified: Secondary | ICD-10-CM | POA: Diagnosis not present

## 2022-04-02 DIAGNOSIS — F411 Generalized anxiety disorder: Secondary | ICD-10-CM | POA: Diagnosis not present

## 2022-04-02 DIAGNOSIS — F0390 Unspecified dementia without behavioral disturbance: Secondary | ICD-10-CM | POA: Diagnosis not present

## 2022-04-02 DIAGNOSIS — W19XXXD Unspecified fall, subsequent encounter: Secondary | ICD-10-CM | POA: Diagnosis not present

## 2022-04-03 DIAGNOSIS — E785 Hyperlipidemia, unspecified: Secondary | ICD-10-CM | POA: Diagnosis not present

## 2022-04-03 DIAGNOSIS — S6291XD Unspecified fracture of right wrist and hand, subsequent encounter for fracture with routine healing: Secondary | ICD-10-CM | POA: Diagnosis not present

## 2022-04-03 DIAGNOSIS — F09 Unspecified mental disorder due to known physiological condition: Secondary | ICD-10-CM | POA: Diagnosis not present

## 2022-04-03 DIAGNOSIS — E46 Unspecified protein-calorie malnutrition: Secondary | ICD-10-CM | POA: Diagnosis not present

## 2022-04-03 DIAGNOSIS — M6281 Muscle weakness (generalized): Secondary | ICD-10-CM | POA: Diagnosis not present

## 2022-04-03 DIAGNOSIS — R531 Weakness: Secondary | ICD-10-CM | POA: Diagnosis not present

## 2022-04-03 DIAGNOSIS — F411 Generalized anxiety disorder: Secondary | ICD-10-CM | POA: Diagnosis not present

## 2022-04-03 DIAGNOSIS — I10 Essential (primary) hypertension: Secondary | ICD-10-CM | POA: Diagnosis not present

## 2022-04-03 DIAGNOSIS — H543 Unqualified visual loss, both eyes: Secondary | ICD-10-CM | POA: Diagnosis not present

## 2022-04-03 DIAGNOSIS — F0390 Unspecified dementia without behavioral disturbance: Secondary | ICD-10-CM | POA: Diagnosis not present

## 2022-04-03 DIAGNOSIS — W19XXXD Unspecified fall, subsequent encounter: Secondary | ICD-10-CM | POA: Diagnosis not present

## 2022-04-03 DIAGNOSIS — R262 Difficulty in walking, not elsewhere classified: Secondary | ICD-10-CM | POA: Diagnosis not present

## 2022-04-03 DIAGNOSIS — J439 Emphysema, unspecified: Secondary | ICD-10-CM | POA: Diagnosis not present

## 2022-04-03 DIAGNOSIS — F339 Major depressive disorder, recurrent, unspecified: Secondary | ICD-10-CM | POA: Diagnosis not present

## 2022-04-04 DIAGNOSIS — F411 Generalized anxiety disorder: Secondary | ICD-10-CM | POA: Diagnosis not present

## 2022-04-04 DIAGNOSIS — E785 Hyperlipidemia, unspecified: Secondary | ICD-10-CM | POA: Diagnosis not present

## 2022-04-04 DIAGNOSIS — E46 Unspecified protein-calorie malnutrition: Secondary | ICD-10-CM | POA: Diagnosis not present

## 2022-04-04 DIAGNOSIS — F0390 Unspecified dementia without behavioral disturbance: Secondary | ICD-10-CM | POA: Diagnosis not present

## 2022-04-04 DIAGNOSIS — W19XXXD Unspecified fall, subsequent encounter: Secondary | ICD-10-CM | POA: Diagnosis not present

## 2022-04-04 DIAGNOSIS — S6291XD Unspecified fracture of right wrist and hand, subsequent encounter for fracture with routine healing: Secondary | ICD-10-CM | POA: Diagnosis not present

## 2022-04-08 DIAGNOSIS — E46 Unspecified protein-calorie malnutrition: Secondary | ICD-10-CM | POA: Diagnosis not present

## 2022-04-08 DIAGNOSIS — E785 Hyperlipidemia, unspecified: Secondary | ICD-10-CM | POA: Diagnosis not present

## 2022-04-08 DIAGNOSIS — S6291XD Unspecified fracture of right wrist and hand, subsequent encounter for fracture with routine healing: Secondary | ICD-10-CM | POA: Diagnosis not present

## 2022-04-08 DIAGNOSIS — F0390 Unspecified dementia without behavioral disturbance: Secondary | ICD-10-CM | POA: Diagnosis not present

## 2022-04-08 DIAGNOSIS — F411 Generalized anxiety disorder: Secondary | ICD-10-CM | POA: Diagnosis not present

## 2022-04-08 DIAGNOSIS — W19XXXD Unspecified fall, subsequent encounter: Secondary | ICD-10-CM | POA: Diagnosis not present

## 2022-04-09 DIAGNOSIS — B351 Tinea unguium: Secondary | ICD-10-CM | POA: Diagnosis not present

## 2022-04-09 DIAGNOSIS — E785 Hyperlipidemia, unspecified: Secondary | ICD-10-CM | POA: Diagnosis not present

## 2022-04-09 DIAGNOSIS — F0390 Unspecified dementia without behavioral disturbance: Secondary | ICD-10-CM | POA: Diagnosis not present

## 2022-04-09 DIAGNOSIS — F411 Generalized anxiety disorder: Secondary | ICD-10-CM | POA: Diagnosis not present

## 2022-04-09 DIAGNOSIS — S6291XD Unspecified fracture of right wrist and hand, subsequent encounter for fracture with routine healing: Secondary | ICD-10-CM | POA: Diagnosis not present

## 2022-04-09 DIAGNOSIS — E46 Unspecified protein-calorie malnutrition: Secondary | ICD-10-CM | POA: Diagnosis not present

## 2022-04-09 DIAGNOSIS — I7091 Generalized atherosclerosis: Secondary | ICD-10-CM | POA: Diagnosis not present

## 2022-04-09 DIAGNOSIS — W19XXXD Unspecified fall, subsequent encounter: Secondary | ICD-10-CM | POA: Diagnosis not present

## 2022-04-10 DIAGNOSIS — F09 Unspecified mental disorder due to known physiological condition: Secondary | ICD-10-CM | POA: Diagnosis not present

## 2022-04-11 DIAGNOSIS — W19XXXD Unspecified fall, subsequent encounter: Secondary | ICD-10-CM | POA: Diagnosis not present

## 2022-04-11 DIAGNOSIS — F411 Generalized anxiety disorder: Secondary | ICD-10-CM | POA: Diagnosis not present

## 2022-04-11 DIAGNOSIS — E46 Unspecified protein-calorie malnutrition: Secondary | ICD-10-CM | POA: Diagnosis not present

## 2022-04-11 DIAGNOSIS — E785 Hyperlipidemia, unspecified: Secondary | ICD-10-CM | POA: Diagnosis not present

## 2022-04-11 DIAGNOSIS — F0390 Unspecified dementia without behavioral disturbance: Secondary | ICD-10-CM | POA: Diagnosis not present

## 2022-04-11 DIAGNOSIS — S6291XD Unspecified fracture of right wrist and hand, subsequent encounter for fracture with routine healing: Secondary | ICD-10-CM | POA: Diagnosis not present

## 2022-04-15 DIAGNOSIS — F0390 Unspecified dementia without behavioral disturbance: Secondary | ICD-10-CM | POA: Diagnosis not present

## 2022-04-15 DIAGNOSIS — W19XXXD Unspecified fall, subsequent encounter: Secondary | ICD-10-CM | POA: Diagnosis not present

## 2022-04-15 DIAGNOSIS — E785 Hyperlipidemia, unspecified: Secondary | ICD-10-CM | POA: Diagnosis not present

## 2022-04-15 DIAGNOSIS — E46 Unspecified protein-calorie malnutrition: Secondary | ICD-10-CM | POA: Diagnosis not present

## 2022-04-15 DIAGNOSIS — S6291XD Unspecified fracture of right wrist and hand, subsequent encounter for fracture with routine healing: Secondary | ICD-10-CM | POA: Diagnosis not present

## 2022-04-15 DIAGNOSIS — F411 Generalized anxiety disorder: Secondary | ICD-10-CM | POA: Diagnosis not present

## 2022-04-16 DIAGNOSIS — E785 Hyperlipidemia, unspecified: Secondary | ICD-10-CM | POA: Diagnosis not present

## 2022-04-16 DIAGNOSIS — F0390 Unspecified dementia without behavioral disturbance: Secondary | ICD-10-CM | POA: Diagnosis not present

## 2022-04-16 DIAGNOSIS — E46 Unspecified protein-calorie malnutrition: Secondary | ICD-10-CM | POA: Diagnosis not present

## 2022-04-16 DIAGNOSIS — W19XXXD Unspecified fall, subsequent encounter: Secondary | ICD-10-CM | POA: Diagnosis not present

## 2022-04-16 DIAGNOSIS — F411 Generalized anxiety disorder: Secondary | ICD-10-CM | POA: Diagnosis not present

## 2022-04-16 DIAGNOSIS — S6291XD Unspecified fracture of right wrist and hand, subsequent encounter for fracture with routine healing: Secondary | ICD-10-CM | POA: Diagnosis not present

## 2022-04-17 DIAGNOSIS — F09 Unspecified mental disorder due to known physiological condition: Secondary | ICD-10-CM | POA: Diagnosis not present

## 2022-04-18 DIAGNOSIS — E785 Hyperlipidemia, unspecified: Secondary | ICD-10-CM | POA: Diagnosis not present

## 2022-04-18 DIAGNOSIS — E46 Unspecified protein-calorie malnutrition: Secondary | ICD-10-CM | POA: Diagnosis not present

## 2022-04-18 DIAGNOSIS — F0390 Unspecified dementia without behavioral disturbance: Secondary | ICD-10-CM | POA: Diagnosis not present

## 2022-04-18 DIAGNOSIS — S6291XD Unspecified fracture of right wrist and hand, subsequent encounter for fracture with routine healing: Secondary | ICD-10-CM | POA: Diagnosis not present

## 2022-04-18 DIAGNOSIS — F411 Generalized anxiety disorder: Secondary | ICD-10-CM | POA: Diagnosis not present

## 2022-04-18 DIAGNOSIS — W19XXXD Unspecified fall, subsequent encounter: Secondary | ICD-10-CM | POA: Diagnosis not present

## 2022-04-23 DIAGNOSIS — F411 Generalized anxiety disorder: Secondary | ICD-10-CM | POA: Diagnosis not present

## 2022-04-23 DIAGNOSIS — F0390 Unspecified dementia without behavioral disturbance: Secondary | ICD-10-CM | POA: Diagnosis not present

## 2022-04-23 DIAGNOSIS — E785 Hyperlipidemia, unspecified: Secondary | ICD-10-CM | POA: Diagnosis not present

## 2022-04-23 DIAGNOSIS — S6291XD Unspecified fracture of right wrist and hand, subsequent encounter for fracture with routine healing: Secondary | ICD-10-CM | POA: Diagnosis not present

## 2022-04-23 DIAGNOSIS — E46 Unspecified protein-calorie malnutrition: Secondary | ICD-10-CM | POA: Diagnosis not present

## 2022-04-23 DIAGNOSIS — W19XXXD Unspecified fall, subsequent encounter: Secondary | ICD-10-CM | POA: Diagnosis not present

## 2022-04-29 DIAGNOSIS — S6291XD Unspecified fracture of right wrist and hand, subsequent encounter for fracture with routine healing: Secondary | ICD-10-CM | POA: Diagnosis not present

## 2022-04-29 DIAGNOSIS — E46 Unspecified protein-calorie malnutrition: Secondary | ICD-10-CM | POA: Diagnosis not present

## 2022-04-29 DIAGNOSIS — E785 Hyperlipidemia, unspecified: Secondary | ICD-10-CM | POA: Diagnosis not present

## 2022-04-29 DIAGNOSIS — W19XXXD Unspecified fall, subsequent encounter: Secondary | ICD-10-CM | POA: Diagnosis not present

## 2022-04-29 DIAGNOSIS — F411 Generalized anxiety disorder: Secondary | ICD-10-CM | POA: Diagnosis not present

## 2022-04-29 DIAGNOSIS — F0390 Unspecified dementia without behavioral disturbance: Secondary | ICD-10-CM | POA: Diagnosis not present

## 2022-04-30 DIAGNOSIS — F411 Generalized anxiety disorder: Secondary | ICD-10-CM | POA: Diagnosis not present

## 2022-04-30 DIAGNOSIS — E46 Unspecified protein-calorie malnutrition: Secondary | ICD-10-CM | POA: Diagnosis not present

## 2022-04-30 DIAGNOSIS — W19XXXD Unspecified fall, subsequent encounter: Secondary | ICD-10-CM | POA: Diagnosis not present

## 2022-04-30 DIAGNOSIS — S6291XD Unspecified fracture of right wrist and hand, subsequent encounter for fracture with routine healing: Secondary | ICD-10-CM | POA: Diagnosis not present

## 2022-04-30 DIAGNOSIS — E785 Hyperlipidemia, unspecified: Secondary | ICD-10-CM | POA: Diagnosis not present

## 2022-04-30 DIAGNOSIS — F0390 Unspecified dementia without behavioral disturbance: Secondary | ICD-10-CM | POA: Diagnosis not present

## 2022-05-01 DIAGNOSIS — W19XXXD Unspecified fall, subsequent encounter: Secondary | ICD-10-CM | POA: Diagnosis not present

## 2022-05-01 DIAGNOSIS — S6291XD Unspecified fracture of right wrist and hand, subsequent encounter for fracture with routine healing: Secondary | ICD-10-CM | POA: Diagnosis not present

## 2022-05-01 DIAGNOSIS — E785 Hyperlipidemia, unspecified: Secondary | ICD-10-CM | POA: Diagnosis not present

## 2022-05-01 DIAGNOSIS — F0390 Unspecified dementia without behavioral disturbance: Secondary | ICD-10-CM | POA: Diagnosis not present

## 2022-05-01 DIAGNOSIS — E46 Unspecified protein-calorie malnutrition: Secondary | ICD-10-CM | POA: Diagnosis not present

## 2022-05-01 DIAGNOSIS — F411 Generalized anxiety disorder: Secondary | ICD-10-CM | POA: Diagnosis not present

## 2022-05-02 DIAGNOSIS — F0390 Unspecified dementia without behavioral disturbance: Secondary | ICD-10-CM | POA: Diagnosis not present

## 2022-05-02 DIAGNOSIS — W19XXXD Unspecified fall, subsequent encounter: Secondary | ICD-10-CM | POA: Diagnosis not present

## 2022-05-02 DIAGNOSIS — E46 Unspecified protein-calorie malnutrition: Secondary | ICD-10-CM | POA: Diagnosis not present

## 2022-05-02 DIAGNOSIS — E785 Hyperlipidemia, unspecified: Secondary | ICD-10-CM | POA: Diagnosis not present

## 2022-05-02 DIAGNOSIS — F411 Generalized anxiety disorder: Secondary | ICD-10-CM | POA: Diagnosis not present

## 2022-05-02 DIAGNOSIS — S6291XD Unspecified fracture of right wrist and hand, subsequent encounter for fracture with routine healing: Secondary | ICD-10-CM | POA: Diagnosis not present

## 2022-05-03 DIAGNOSIS — R262 Difficulty in walking, not elsewhere classified: Secondary | ICD-10-CM | POA: Diagnosis not present

## 2022-05-03 DIAGNOSIS — E46 Unspecified protein-calorie malnutrition: Secondary | ICD-10-CM | POA: Diagnosis not present

## 2022-05-03 DIAGNOSIS — S6291XD Unspecified fracture of right wrist and hand, subsequent encounter for fracture with routine healing: Secondary | ICD-10-CM | POA: Diagnosis not present

## 2022-05-03 DIAGNOSIS — F09 Unspecified mental disorder due to known physiological condition: Secondary | ICD-10-CM | POA: Diagnosis not present

## 2022-05-03 DIAGNOSIS — F339 Major depressive disorder, recurrent, unspecified: Secondary | ICD-10-CM | POA: Diagnosis not present

## 2022-05-03 DIAGNOSIS — H543 Unqualified visual loss, both eyes: Secondary | ICD-10-CM | POA: Diagnosis not present

## 2022-05-03 DIAGNOSIS — F411 Generalized anxiety disorder: Secondary | ICD-10-CM | POA: Diagnosis not present

## 2022-05-03 DIAGNOSIS — E785 Hyperlipidemia, unspecified: Secondary | ICD-10-CM | POA: Diagnosis not present

## 2022-05-03 DIAGNOSIS — J439 Emphysema, unspecified: Secondary | ICD-10-CM | POA: Diagnosis not present

## 2022-05-03 DIAGNOSIS — R531 Weakness: Secondary | ICD-10-CM | POA: Diagnosis not present

## 2022-05-03 DIAGNOSIS — M6281 Muscle weakness (generalized): Secondary | ICD-10-CM | POA: Diagnosis not present

## 2022-05-03 DIAGNOSIS — W19XXXD Unspecified fall, subsequent encounter: Secondary | ICD-10-CM | POA: Diagnosis not present

## 2022-05-03 DIAGNOSIS — I10 Essential (primary) hypertension: Secondary | ICD-10-CM | POA: Diagnosis not present

## 2022-05-03 DIAGNOSIS — F0390 Unspecified dementia without behavioral disturbance: Secondary | ICD-10-CM | POA: Diagnosis not present

## 2022-05-07 DIAGNOSIS — F0390 Unspecified dementia without behavioral disturbance: Secondary | ICD-10-CM | POA: Diagnosis not present

## 2022-05-07 DIAGNOSIS — E46 Unspecified protein-calorie malnutrition: Secondary | ICD-10-CM | POA: Diagnosis not present

## 2022-05-07 DIAGNOSIS — F411 Generalized anxiety disorder: Secondary | ICD-10-CM | POA: Diagnosis not present

## 2022-05-07 DIAGNOSIS — W19XXXD Unspecified fall, subsequent encounter: Secondary | ICD-10-CM | POA: Diagnosis not present

## 2022-05-07 DIAGNOSIS — S6291XD Unspecified fracture of right wrist and hand, subsequent encounter for fracture with routine healing: Secondary | ICD-10-CM | POA: Diagnosis not present

## 2022-05-07 DIAGNOSIS — E785 Hyperlipidemia, unspecified: Secondary | ICD-10-CM | POA: Diagnosis not present

## 2022-05-08 DIAGNOSIS — E785 Hyperlipidemia, unspecified: Secondary | ICD-10-CM | POA: Diagnosis not present

## 2022-05-08 DIAGNOSIS — I10 Essential (primary) hypertension: Secondary | ICD-10-CM | POA: Diagnosis not present

## 2022-05-08 DIAGNOSIS — F0390 Unspecified dementia without behavioral disturbance: Secondary | ICD-10-CM | POA: Diagnosis not present

## 2022-05-08 DIAGNOSIS — E43 Unspecified severe protein-calorie malnutrition: Secondary | ICD-10-CM | POA: Diagnosis not present

## 2022-05-09 DIAGNOSIS — F0390 Unspecified dementia without behavioral disturbance: Secondary | ICD-10-CM | POA: Diagnosis not present

## 2022-05-09 DIAGNOSIS — E46 Unspecified protein-calorie malnutrition: Secondary | ICD-10-CM | POA: Diagnosis not present

## 2022-05-09 DIAGNOSIS — S6291XD Unspecified fracture of right wrist and hand, subsequent encounter for fracture with routine healing: Secondary | ICD-10-CM | POA: Diagnosis not present

## 2022-05-09 DIAGNOSIS — F411 Generalized anxiety disorder: Secondary | ICD-10-CM | POA: Diagnosis not present

## 2022-05-09 DIAGNOSIS — W19XXXD Unspecified fall, subsequent encounter: Secondary | ICD-10-CM | POA: Diagnosis not present

## 2022-05-09 DIAGNOSIS — F09 Unspecified mental disorder due to known physiological condition: Secondary | ICD-10-CM | POA: Diagnosis not present

## 2022-05-09 DIAGNOSIS — E785 Hyperlipidemia, unspecified: Secondary | ICD-10-CM | POA: Diagnosis not present

## 2022-05-13 DIAGNOSIS — I739 Peripheral vascular disease, unspecified: Secondary | ICD-10-CM | POA: Diagnosis not present

## 2022-05-13 DIAGNOSIS — J439 Emphysema, unspecified: Secondary | ICD-10-CM | POA: Diagnosis not present

## 2022-05-13 DIAGNOSIS — E559 Vitamin D deficiency, unspecified: Secondary | ICD-10-CM | POA: Diagnosis not present

## 2022-05-13 DIAGNOSIS — F339 Major depressive disorder, recurrent, unspecified: Secondary | ICD-10-CM | POA: Diagnosis not present

## 2022-05-13 DIAGNOSIS — H543 Unqualified visual loss, both eyes: Secondary | ICD-10-CM | POA: Diagnosis not present

## 2022-05-13 DIAGNOSIS — R52 Pain, unspecified: Secondary | ICD-10-CM | POA: Diagnosis not present

## 2022-05-13 DIAGNOSIS — F411 Generalized anxiety disorder: Secondary | ICD-10-CM | POA: Diagnosis not present

## 2022-05-13 DIAGNOSIS — W19XXXD Unspecified fall, subsequent encounter: Secondary | ICD-10-CM | POA: Diagnosis not present

## 2022-05-13 DIAGNOSIS — K5900 Constipation, unspecified: Secondary | ICD-10-CM | POA: Diagnosis not present

## 2022-05-13 DIAGNOSIS — F0390 Unspecified dementia without behavioral disturbance: Secondary | ICD-10-CM | POA: Diagnosis not present

## 2022-05-13 DIAGNOSIS — E43 Unspecified severe protein-calorie malnutrition: Secondary | ICD-10-CM | POA: Diagnosis not present

## 2022-05-13 DIAGNOSIS — S6291XD Unspecified fracture of right wrist and hand, subsequent encounter for fracture with routine healing: Secondary | ICD-10-CM | POA: Diagnosis not present

## 2022-05-13 DIAGNOSIS — E785 Hyperlipidemia, unspecified: Secondary | ICD-10-CM | POA: Diagnosis not present

## 2022-05-13 DIAGNOSIS — E46 Unspecified protein-calorie malnutrition: Secondary | ICD-10-CM | POA: Diagnosis not present

## 2022-05-13 DIAGNOSIS — I10 Essential (primary) hypertension: Secondary | ICD-10-CM | POA: Diagnosis not present

## 2022-05-14 DIAGNOSIS — E46 Unspecified protein-calorie malnutrition: Secondary | ICD-10-CM | POA: Diagnosis not present

## 2022-05-14 DIAGNOSIS — S6291XD Unspecified fracture of right wrist and hand, subsequent encounter for fracture with routine healing: Secondary | ICD-10-CM | POA: Diagnosis not present

## 2022-05-14 DIAGNOSIS — F411 Generalized anxiety disorder: Secondary | ICD-10-CM | POA: Diagnosis not present

## 2022-05-14 DIAGNOSIS — W19XXXD Unspecified fall, subsequent encounter: Secondary | ICD-10-CM | POA: Diagnosis not present

## 2022-05-14 DIAGNOSIS — F0390 Unspecified dementia without behavioral disturbance: Secondary | ICD-10-CM | POA: Diagnosis not present

## 2022-05-14 DIAGNOSIS — E785 Hyperlipidemia, unspecified: Secondary | ICD-10-CM | POA: Diagnosis not present

## 2022-05-16 DIAGNOSIS — S6291XD Unspecified fracture of right wrist and hand, subsequent encounter for fracture with routine healing: Secondary | ICD-10-CM | POA: Diagnosis not present

## 2022-05-16 DIAGNOSIS — E785 Hyperlipidemia, unspecified: Secondary | ICD-10-CM | POA: Diagnosis not present

## 2022-05-16 DIAGNOSIS — F0390 Unspecified dementia without behavioral disturbance: Secondary | ICD-10-CM | POA: Diagnosis not present

## 2022-05-16 DIAGNOSIS — E46 Unspecified protein-calorie malnutrition: Secondary | ICD-10-CM | POA: Diagnosis not present

## 2022-05-16 DIAGNOSIS — W19XXXD Unspecified fall, subsequent encounter: Secondary | ICD-10-CM | POA: Diagnosis not present

## 2022-05-16 DIAGNOSIS — F411 Generalized anxiety disorder: Secondary | ICD-10-CM | POA: Diagnosis not present

## 2022-05-17 DIAGNOSIS — F09 Unspecified mental disorder due to known physiological condition: Secondary | ICD-10-CM | POA: Diagnosis not present

## 2022-05-21 DIAGNOSIS — W19XXXD Unspecified fall, subsequent encounter: Secondary | ICD-10-CM | POA: Diagnosis not present

## 2022-05-21 DIAGNOSIS — E785 Hyperlipidemia, unspecified: Secondary | ICD-10-CM | POA: Diagnosis not present

## 2022-05-21 DIAGNOSIS — E46 Unspecified protein-calorie malnutrition: Secondary | ICD-10-CM | POA: Diagnosis not present

## 2022-05-21 DIAGNOSIS — S6291XD Unspecified fracture of right wrist and hand, subsequent encounter for fracture with routine healing: Secondary | ICD-10-CM | POA: Diagnosis not present

## 2022-05-21 DIAGNOSIS — F411 Generalized anxiety disorder: Secondary | ICD-10-CM | POA: Diagnosis not present

## 2022-05-21 DIAGNOSIS — F0390 Unspecified dementia without behavioral disturbance: Secondary | ICD-10-CM | POA: Diagnosis not present

## 2022-05-22 DIAGNOSIS — F339 Major depressive disorder, recurrent, unspecified: Secondary | ICD-10-CM | POA: Diagnosis not present

## 2022-05-22 DIAGNOSIS — F01B0 Vascular dementia, moderate, without behavioral disturbance, psychotic disturbance, mood disturbance, and anxiety: Secondary | ICD-10-CM | POA: Diagnosis not present

## 2022-05-22 DIAGNOSIS — F411 Generalized anxiety disorder: Secondary | ICD-10-CM | POA: Diagnosis not present

## 2022-05-23 DIAGNOSIS — S6291XD Unspecified fracture of right wrist and hand, subsequent encounter for fracture with routine healing: Secondary | ICD-10-CM | POA: Diagnosis not present

## 2022-05-23 DIAGNOSIS — F411 Generalized anxiety disorder: Secondary | ICD-10-CM | POA: Diagnosis not present

## 2022-05-23 DIAGNOSIS — E785 Hyperlipidemia, unspecified: Secondary | ICD-10-CM | POA: Diagnosis not present

## 2022-05-23 DIAGNOSIS — F0390 Unspecified dementia without behavioral disturbance: Secondary | ICD-10-CM | POA: Diagnosis not present

## 2022-05-23 DIAGNOSIS — E46 Unspecified protein-calorie malnutrition: Secondary | ICD-10-CM | POA: Diagnosis not present

## 2022-05-23 DIAGNOSIS — W19XXXD Unspecified fall, subsequent encounter: Secondary | ICD-10-CM | POA: Diagnosis not present

## 2022-05-24 DIAGNOSIS — F09 Unspecified mental disorder due to known physiological condition: Secondary | ICD-10-CM | POA: Diagnosis not present

## 2022-05-28 DIAGNOSIS — E785 Hyperlipidemia, unspecified: Secondary | ICD-10-CM | POA: Diagnosis not present

## 2022-05-28 DIAGNOSIS — F411 Generalized anxiety disorder: Secondary | ICD-10-CM | POA: Diagnosis not present

## 2022-05-28 DIAGNOSIS — S6291XD Unspecified fracture of right wrist and hand, subsequent encounter for fracture with routine healing: Secondary | ICD-10-CM | POA: Diagnosis not present

## 2022-05-28 DIAGNOSIS — F0390 Unspecified dementia without behavioral disturbance: Secondary | ICD-10-CM | POA: Diagnosis not present

## 2022-05-28 DIAGNOSIS — E46 Unspecified protein-calorie malnutrition: Secondary | ICD-10-CM | POA: Diagnosis not present

## 2022-05-28 DIAGNOSIS — W19XXXD Unspecified fall, subsequent encounter: Secondary | ICD-10-CM | POA: Diagnosis not present

## 2022-05-29 DIAGNOSIS — F0390 Unspecified dementia without behavioral disturbance: Secondary | ICD-10-CM | POA: Diagnosis not present

## 2022-05-29 DIAGNOSIS — F411 Generalized anxiety disorder: Secondary | ICD-10-CM | POA: Diagnosis not present

## 2022-05-29 DIAGNOSIS — S6291XD Unspecified fracture of right wrist and hand, subsequent encounter for fracture with routine healing: Secondary | ICD-10-CM | POA: Diagnosis not present

## 2022-05-29 DIAGNOSIS — E785 Hyperlipidemia, unspecified: Secondary | ICD-10-CM | POA: Diagnosis not present

## 2022-05-29 DIAGNOSIS — W19XXXD Unspecified fall, subsequent encounter: Secondary | ICD-10-CM | POA: Diagnosis not present

## 2022-05-29 DIAGNOSIS — E46 Unspecified protein-calorie malnutrition: Secondary | ICD-10-CM | POA: Diagnosis not present

## 2022-05-30 DIAGNOSIS — F411 Generalized anxiety disorder: Secondary | ICD-10-CM | POA: Diagnosis not present

## 2022-05-30 DIAGNOSIS — S6291XD Unspecified fracture of right wrist and hand, subsequent encounter for fracture with routine healing: Secondary | ICD-10-CM | POA: Diagnosis not present

## 2022-05-30 DIAGNOSIS — E785 Hyperlipidemia, unspecified: Secondary | ICD-10-CM | POA: Diagnosis not present

## 2022-05-30 DIAGNOSIS — F0390 Unspecified dementia without behavioral disturbance: Secondary | ICD-10-CM | POA: Diagnosis not present

## 2022-05-30 DIAGNOSIS — E46 Unspecified protein-calorie malnutrition: Secondary | ICD-10-CM | POA: Diagnosis not present

## 2022-05-30 DIAGNOSIS — W19XXXD Unspecified fall, subsequent encounter: Secondary | ICD-10-CM | POA: Diagnosis not present

## 2022-05-31 DIAGNOSIS — F411 Generalized anxiety disorder: Secondary | ICD-10-CM | POA: Diagnosis not present

## 2022-05-31 DIAGNOSIS — G894 Chronic pain syndrome: Secondary | ICD-10-CM | POA: Diagnosis not present

## 2022-05-31 DIAGNOSIS — M898X1 Other specified disorders of bone, shoulder: Secondary | ICD-10-CM | POA: Diagnosis not present

## 2022-06-03 DIAGNOSIS — M6281 Muscle weakness (generalized): Secondary | ICD-10-CM | POA: Diagnosis not present

## 2022-06-03 DIAGNOSIS — E46 Unspecified protein-calorie malnutrition: Secondary | ICD-10-CM | POA: Diagnosis not present

## 2022-06-03 DIAGNOSIS — J439 Emphysema, unspecified: Secondary | ICD-10-CM | POA: Diagnosis not present

## 2022-06-03 DIAGNOSIS — F339 Major depressive disorder, recurrent, unspecified: Secondary | ICD-10-CM | POA: Diagnosis not present

## 2022-06-03 DIAGNOSIS — E785 Hyperlipidemia, unspecified: Secondary | ICD-10-CM | POA: Diagnosis not present

## 2022-06-03 DIAGNOSIS — R262 Difficulty in walking, not elsewhere classified: Secondary | ICD-10-CM | POA: Diagnosis not present

## 2022-06-03 DIAGNOSIS — H543 Unqualified visual loss, both eyes: Secondary | ICD-10-CM | POA: Diagnosis not present

## 2022-06-03 DIAGNOSIS — F0390 Unspecified dementia without behavioral disturbance: Secondary | ICD-10-CM | POA: Diagnosis not present

## 2022-06-03 DIAGNOSIS — W19XXXD Unspecified fall, subsequent encounter: Secondary | ICD-10-CM | POA: Diagnosis not present

## 2022-06-03 DIAGNOSIS — R531 Weakness: Secondary | ICD-10-CM | POA: Diagnosis not present

## 2022-06-03 DIAGNOSIS — S6291XD Unspecified fracture of right wrist and hand, subsequent encounter for fracture with routine healing: Secondary | ICD-10-CM | POA: Diagnosis not present

## 2022-06-03 DIAGNOSIS — F411 Generalized anxiety disorder: Secondary | ICD-10-CM | POA: Diagnosis not present

## 2022-06-03 DIAGNOSIS — I10 Essential (primary) hypertension: Secondary | ICD-10-CM | POA: Diagnosis not present

## 2022-06-04 DIAGNOSIS — F411 Generalized anxiety disorder: Secondary | ICD-10-CM | POA: Diagnosis not present

## 2022-06-04 DIAGNOSIS — F0390 Unspecified dementia without behavioral disturbance: Secondary | ICD-10-CM | POA: Diagnosis not present

## 2022-06-04 DIAGNOSIS — S6291XD Unspecified fracture of right wrist and hand, subsequent encounter for fracture with routine healing: Secondary | ICD-10-CM | POA: Diagnosis not present

## 2022-06-04 DIAGNOSIS — E46 Unspecified protein-calorie malnutrition: Secondary | ICD-10-CM | POA: Diagnosis not present

## 2022-06-04 DIAGNOSIS — E785 Hyperlipidemia, unspecified: Secondary | ICD-10-CM | POA: Diagnosis not present

## 2022-06-04 DIAGNOSIS — W19XXXD Unspecified fall, subsequent encounter: Secondary | ICD-10-CM | POA: Diagnosis not present

## 2022-06-05 DIAGNOSIS — S6291XD Unspecified fracture of right wrist and hand, subsequent encounter for fracture with routine healing: Secondary | ICD-10-CM | POA: Diagnosis not present

## 2022-06-05 DIAGNOSIS — E46 Unspecified protein-calorie malnutrition: Secondary | ICD-10-CM | POA: Diagnosis not present

## 2022-06-05 DIAGNOSIS — F0390 Unspecified dementia without behavioral disturbance: Secondary | ICD-10-CM | POA: Diagnosis not present

## 2022-06-05 DIAGNOSIS — F411 Generalized anxiety disorder: Secondary | ICD-10-CM | POA: Diagnosis not present

## 2022-06-05 DIAGNOSIS — W19XXXD Unspecified fall, subsequent encounter: Secondary | ICD-10-CM | POA: Diagnosis not present

## 2022-06-05 DIAGNOSIS — E785 Hyperlipidemia, unspecified: Secondary | ICD-10-CM | POA: Diagnosis not present

## 2022-06-06 DIAGNOSIS — E46 Unspecified protein-calorie malnutrition: Secondary | ICD-10-CM | POA: Diagnosis not present

## 2022-06-06 DIAGNOSIS — S6291XD Unspecified fracture of right wrist and hand, subsequent encounter for fracture with routine healing: Secondary | ICD-10-CM | POA: Diagnosis not present

## 2022-06-06 DIAGNOSIS — F0390 Unspecified dementia without behavioral disturbance: Secondary | ICD-10-CM | POA: Diagnosis not present

## 2022-06-06 DIAGNOSIS — W19XXXD Unspecified fall, subsequent encounter: Secondary | ICD-10-CM | POA: Diagnosis not present

## 2022-06-06 DIAGNOSIS — F411 Generalized anxiety disorder: Secondary | ICD-10-CM | POA: Diagnosis not present

## 2022-06-06 DIAGNOSIS — E785 Hyperlipidemia, unspecified: Secondary | ICD-10-CM | POA: Diagnosis not present

## 2022-06-10 DIAGNOSIS — F0390 Unspecified dementia without behavioral disturbance: Secondary | ICD-10-CM | POA: Diagnosis not present

## 2022-06-10 DIAGNOSIS — E785 Hyperlipidemia, unspecified: Secondary | ICD-10-CM | POA: Diagnosis not present

## 2022-06-10 DIAGNOSIS — W19XXXD Unspecified fall, subsequent encounter: Secondary | ICD-10-CM | POA: Diagnosis not present

## 2022-06-10 DIAGNOSIS — E46 Unspecified protein-calorie malnutrition: Secondary | ICD-10-CM | POA: Diagnosis not present

## 2022-06-10 DIAGNOSIS — S6291XD Unspecified fracture of right wrist and hand, subsequent encounter for fracture with routine healing: Secondary | ICD-10-CM | POA: Diagnosis not present

## 2022-06-10 DIAGNOSIS — F411 Generalized anxiety disorder: Secondary | ICD-10-CM | POA: Diagnosis not present

## 2022-06-11 DIAGNOSIS — F411 Generalized anxiety disorder: Secondary | ICD-10-CM | POA: Diagnosis not present

## 2022-06-11 DIAGNOSIS — E46 Unspecified protein-calorie malnutrition: Secondary | ICD-10-CM | POA: Diagnosis not present

## 2022-06-11 DIAGNOSIS — S6291XD Unspecified fracture of right wrist and hand, subsequent encounter for fracture with routine healing: Secondary | ICD-10-CM | POA: Diagnosis not present

## 2022-06-11 DIAGNOSIS — F0390 Unspecified dementia without behavioral disturbance: Secondary | ICD-10-CM | POA: Diagnosis not present

## 2022-06-11 DIAGNOSIS — W19XXXD Unspecified fall, subsequent encounter: Secondary | ICD-10-CM | POA: Diagnosis not present

## 2022-06-11 DIAGNOSIS — E785 Hyperlipidemia, unspecified: Secondary | ICD-10-CM | POA: Diagnosis not present

## 2022-06-13 DIAGNOSIS — F411 Generalized anxiety disorder: Secondary | ICD-10-CM | POA: Diagnosis not present

## 2022-06-13 DIAGNOSIS — W19XXXD Unspecified fall, subsequent encounter: Secondary | ICD-10-CM | POA: Diagnosis not present

## 2022-06-13 DIAGNOSIS — B351 Tinea unguium: Secondary | ICD-10-CM | POA: Diagnosis not present

## 2022-06-13 DIAGNOSIS — I7091 Generalized atherosclerosis: Secondary | ICD-10-CM | POA: Diagnosis not present

## 2022-06-13 DIAGNOSIS — E785 Hyperlipidemia, unspecified: Secondary | ICD-10-CM | POA: Diagnosis not present

## 2022-06-13 DIAGNOSIS — E46 Unspecified protein-calorie malnutrition: Secondary | ICD-10-CM | POA: Diagnosis not present

## 2022-06-13 DIAGNOSIS — S6291XD Unspecified fracture of right wrist and hand, subsequent encounter for fracture with routine healing: Secondary | ICD-10-CM | POA: Diagnosis not present

## 2022-06-13 DIAGNOSIS — F0390 Unspecified dementia without behavioral disturbance: Secondary | ICD-10-CM | POA: Diagnosis not present

## 2022-06-14 DIAGNOSIS — F09 Unspecified mental disorder due to known physiological condition: Secondary | ICD-10-CM | POA: Diagnosis not present

## 2022-06-18 DIAGNOSIS — E46 Unspecified protein-calorie malnutrition: Secondary | ICD-10-CM | POA: Diagnosis not present

## 2022-06-18 DIAGNOSIS — W19XXXD Unspecified fall, subsequent encounter: Secondary | ICD-10-CM | POA: Diagnosis not present

## 2022-06-18 DIAGNOSIS — F411 Generalized anxiety disorder: Secondary | ICD-10-CM | POA: Diagnosis not present

## 2022-06-18 DIAGNOSIS — E785 Hyperlipidemia, unspecified: Secondary | ICD-10-CM | POA: Diagnosis not present

## 2022-06-18 DIAGNOSIS — F0390 Unspecified dementia without behavioral disturbance: Secondary | ICD-10-CM | POA: Diagnosis not present

## 2022-06-18 DIAGNOSIS — S6291XD Unspecified fracture of right wrist and hand, subsequent encounter for fracture with routine healing: Secondary | ICD-10-CM | POA: Diagnosis not present

## 2022-06-20 DIAGNOSIS — W19XXXD Unspecified fall, subsequent encounter: Secondary | ICD-10-CM | POA: Diagnosis not present

## 2022-06-20 DIAGNOSIS — S6291XD Unspecified fracture of right wrist and hand, subsequent encounter for fracture with routine healing: Secondary | ICD-10-CM | POA: Diagnosis not present

## 2022-06-20 DIAGNOSIS — F0390 Unspecified dementia without behavioral disturbance: Secondary | ICD-10-CM | POA: Diagnosis not present

## 2022-06-20 DIAGNOSIS — E46 Unspecified protein-calorie malnutrition: Secondary | ICD-10-CM | POA: Diagnosis not present

## 2022-06-20 DIAGNOSIS — E785 Hyperlipidemia, unspecified: Secondary | ICD-10-CM | POA: Diagnosis not present

## 2022-06-20 DIAGNOSIS — F411 Generalized anxiety disorder: Secondary | ICD-10-CM | POA: Diagnosis not present

## 2022-06-25 DIAGNOSIS — S6291XD Unspecified fracture of right wrist and hand, subsequent encounter for fracture with routine healing: Secondary | ICD-10-CM | POA: Diagnosis not present

## 2022-06-25 DIAGNOSIS — F0390 Unspecified dementia without behavioral disturbance: Secondary | ICD-10-CM | POA: Diagnosis not present

## 2022-06-25 DIAGNOSIS — W19XXXD Unspecified fall, subsequent encounter: Secondary | ICD-10-CM | POA: Diagnosis not present

## 2022-06-25 DIAGNOSIS — E785 Hyperlipidemia, unspecified: Secondary | ICD-10-CM | POA: Diagnosis not present

## 2022-06-25 DIAGNOSIS — E46 Unspecified protein-calorie malnutrition: Secondary | ICD-10-CM | POA: Diagnosis not present

## 2022-06-25 DIAGNOSIS — F411 Generalized anxiety disorder: Secondary | ICD-10-CM | POA: Diagnosis not present

## 2022-06-26 DIAGNOSIS — F411 Generalized anxiety disorder: Secondary | ICD-10-CM | POA: Diagnosis not present

## 2022-06-26 DIAGNOSIS — F0390 Unspecified dementia without behavioral disturbance: Secondary | ICD-10-CM | POA: Diagnosis not present

## 2022-06-26 DIAGNOSIS — F01B0 Vascular dementia, moderate, without behavioral disturbance, psychotic disturbance, mood disturbance, and anxiety: Secondary | ICD-10-CM | POA: Diagnosis not present

## 2022-06-26 DIAGNOSIS — F339 Major depressive disorder, recurrent, unspecified: Secondary | ICD-10-CM | POA: Diagnosis not present

## 2022-06-27 DIAGNOSIS — W19XXXD Unspecified fall, subsequent encounter: Secondary | ICD-10-CM | POA: Diagnosis not present

## 2022-06-27 DIAGNOSIS — F411 Generalized anxiety disorder: Secondary | ICD-10-CM | POA: Diagnosis not present

## 2022-06-27 DIAGNOSIS — E46 Unspecified protein-calorie malnutrition: Secondary | ICD-10-CM | POA: Diagnosis not present

## 2022-06-27 DIAGNOSIS — E785 Hyperlipidemia, unspecified: Secondary | ICD-10-CM | POA: Diagnosis not present

## 2022-06-27 DIAGNOSIS — F0390 Unspecified dementia without behavioral disturbance: Secondary | ICD-10-CM | POA: Diagnosis not present

## 2022-06-27 DIAGNOSIS — S6291XD Unspecified fracture of right wrist and hand, subsequent encounter for fracture with routine healing: Secondary | ICD-10-CM | POA: Diagnosis not present

## 2022-07-02 DIAGNOSIS — S6291XD Unspecified fracture of right wrist and hand, subsequent encounter for fracture with routine healing: Secondary | ICD-10-CM | POA: Diagnosis not present

## 2022-07-02 DIAGNOSIS — F411 Generalized anxiety disorder: Secondary | ICD-10-CM | POA: Diagnosis not present

## 2022-07-02 DIAGNOSIS — F0390 Unspecified dementia without behavioral disturbance: Secondary | ICD-10-CM | POA: Diagnosis not present

## 2022-07-02 DIAGNOSIS — W19XXXD Unspecified fall, subsequent encounter: Secondary | ICD-10-CM | POA: Diagnosis not present

## 2022-07-02 DIAGNOSIS — E785 Hyperlipidemia, unspecified: Secondary | ICD-10-CM | POA: Diagnosis not present

## 2022-07-02 DIAGNOSIS — E46 Unspecified protein-calorie malnutrition: Secondary | ICD-10-CM | POA: Diagnosis not present

## 2022-07-04 DIAGNOSIS — M6281 Muscle weakness (generalized): Secondary | ICD-10-CM | POA: Diagnosis not present

## 2022-07-04 DIAGNOSIS — I10 Essential (primary) hypertension: Secondary | ICD-10-CM | POA: Diagnosis not present

## 2022-07-04 DIAGNOSIS — J439 Emphysema, unspecified: Secondary | ICD-10-CM | POA: Diagnosis not present

## 2022-07-04 DIAGNOSIS — F411 Generalized anxiety disorder: Secondary | ICD-10-CM | POA: Diagnosis not present

## 2022-07-04 DIAGNOSIS — E785 Hyperlipidemia, unspecified: Secondary | ICD-10-CM | POA: Diagnosis not present

## 2022-07-04 DIAGNOSIS — W19XXXD Unspecified fall, subsequent encounter: Secondary | ICD-10-CM | POA: Diagnosis not present

## 2022-07-04 DIAGNOSIS — F339 Major depressive disorder, recurrent, unspecified: Secondary | ICD-10-CM | POA: Diagnosis not present

## 2022-07-04 DIAGNOSIS — R531 Weakness: Secondary | ICD-10-CM | POA: Diagnosis not present

## 2022-07-04 DIAGNOSIS — F0390 Unspecified dementia without behavioral disturbance: Secondary | ICD-10-CM | POA: Diagnosis not present

## 2022-07-04 DIAGNOSIS — H543 Unqualified visual loss, both eyes: Secondary | ICD-10-CM | POA: Diagnosis not present

## 2022-07-04 DIAGNOSIS — R262 Difficulty in walking, not elsewhere classified: Secondary | ICD-10-CM | POA: Diagnosis not present

## 2022-07-04 DIAGNOSIS — S6291XD Unspecified fracture of right wrist and hand, subsequent encounter for fracture with routine healing: Secondary | ICD-10-CM | POA: Diagnosis not present

## 2022-07-04 DIAGNOSIS — E46 Unspecified protein-calorie malnutrition: Secondary | ICD-10-CM | POA: Diagnosis not present

## 2022-07-09 DIAGNOSIS — F411 Generalized anxiety disorder: Secondary | ICD-10-CM | POA: Diagnosis not present

## 2022-07-09 DIAGNOSIS — E785 Hyperlipidemia, unspecified: Secondary | ICD-10-CM | POA: Diagnosis not present

## 2022-07-09 DIAGNOSIS — F0390 Unspecified dementia without behavioral disturbance: Secondary | ICD-10-CM | POA: Diagnosis not present

## 2022-07-09 DIAGNOSIS — E46 Unspecified protein-calorie malnutrition: Secondary | ICD-10-CM | POA: Diagnosis not present

## 2022-07-09 DIAGNOSIS — W19XXXD Unspecified fall, subsequent encounter: Secondary | ICD-10-CM | POA: Diagnosis not present

## 2022-07-09 DIAGNOSIS — S6291XD Unspecified fracture of right wrist and hand, subsequent encounter for fracture with routine healing: Secondary | ICD-10-CM | POA: Diagnosis not present

## 2022-07-11 DIAGNOSIS — F0390 Unspecified dementia without behavioral disturbance: Secondary | ICD-10-CM | POA: Diagnosis not present

## 2022-07-11 DIAGNOSIS — E785 Hyperlipidemia, unspecified: Secondary | ICD-10-CM | POA: Diagnosis not present

## 2022-07-11 DIAGNOSIS — S6291XD Unspecified fracture of right wrist and hand, subsequent encounter for fracture with routine healing: Secondary | ICD-10-CM | POA: Diagnosis not present

## 2022-07-11 DIAGNOSIS — F411 Generalized anxiety disorder: Secondary | ICD-10-CM | POA: Diagnosis not present

## 2022-07-11 DIAGNOSIS — W19XXXD Unspecified fall, subsequent encounter: Secondary | ICD-10-CM | POA: Diagnosis not present

## 2022-07-11 DIAGNOSIS — E46 Unspecified protein-calorie malnutrition: Secondary | ICD-10-CM | POA: Diagnosis not present

## 2022-07-15 DIAGNOSIS — S6291XD Unspecified fracture of right wrist and hand, subsequent encounter for fracture with routine healing: Secondary | ICD-10-CM | POA: Diagnosis not present

## 2022-07-15 DIAGNOSIS — E785 Hyperlipidemia, unspecified: Secondary | ICD-10-CM | POA: Diagnosis not present

## 2022-07-15 DIAGNOSIS — W19XXXD Unspecified fall, subsequent encounter: Secondary | ICD-10-CM | POA: Diagnosis not present

## 2022-07-15 DIAGNOSIS — F0390 Unspecified dementia without behavioral disturbance: Secondary | ICD-10-CM | POA: Diagnosis not present

## 2022-07-15 DIAGNOSIS — F411 Generalized anxiety disorder: Secondary | ICD-10-CM | POA: Diagnosis not present

## 2022-07-15 DIAGNOSIS — E46 Unspecified protein-calorie malnutrition: Secondary | ICD-10-CM | POA: Diagnosis not present

## 2022-07-16 DIAGNOSIS — W19XXXD Unspecified fall, subsequent encounter: Secondary | ICD-10-CM | POA: Diagnosis not present

## 2022-07-16 DIAGNOSIS — E46 Unspecified protein-calorie malnutrition: Secondary | ICD-10-CM | POA: Diagnosis not present

## 2022-07-16 DIAGNOSIS — F0390 Unspecified dementia without behavioral disturbance: Secondary | ICD-10-CM | POA: Diagnosis not present

## 2022-07-16 DIAGNOSIS — S6291XD Unspecified fracture of right wrist and hand, subsequent encounter for fracture with routine healing: Secondary | ICD-10-CM | POA: Diagnosis not present

## 2022-07-16 DIAGNOSIS — E785 Hyperlipidemia, unspecified: Secondary | ICD-10-CM | POA: Diagnosis not present

## 2022-07-16 DIAGNOSIS — F411 Generalized anxiety disorder: Secondary | ICD-10-CM | POA: Diagnosis not present

## 2022-07-18 DIAGNOSIS — S6291XD Unspecified fracture of right wrist and hand, subsequent encounter for fracture with routine healing: Secondary | ICD-10-CM | POA: Diagnosis not present

## 2022-07-18 DIAGNOSIS — E46 Unspecified protein-calorie malnutrition: Secondary | ICD-10-CM | POA: Diagnosis not present

## 2022-07-18 DIAGNOSIS — F0390 Unspecified dementia without behavioral disturbance: Secondary | ICD-10-CM | POA: Diagnosis not present

## 2022-07-18 DIAGNOSIS — E785 Hyperlipidemia, unspecified: Secondary | ICD-10-CM | POA: Diagnosis not present

## 2022-07-18 DIAGNOSIS — W19XXXD Unspecified fall, subsequent encounter: Secondary | ICD-10-CM | POA: Diagnosis not present

## 2022-07-18 DIAGNOSIS — F411 Generalized anxiety disorder: Secondary | ICD-10-CM | POA: Diagnosis not present

## 2022-07-18 IMAGING — CT CT CERVICAL SPINE W/O CM
3 of 4 series · 12 of 33 positions shown, 14 images · non-contrast
Comparison: 04/28/2021

CLINICAL DATA: Fell from couch. Hit head.



[Series 9: sagittal bone · sagittal · 0.29mm/px · 5 of 61 slices shown, 6 images]
[im 21/61  bone]
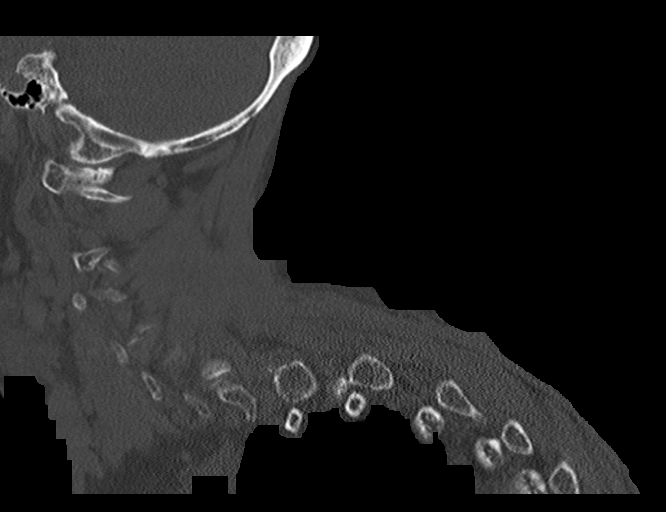
[im 26/61  bone]
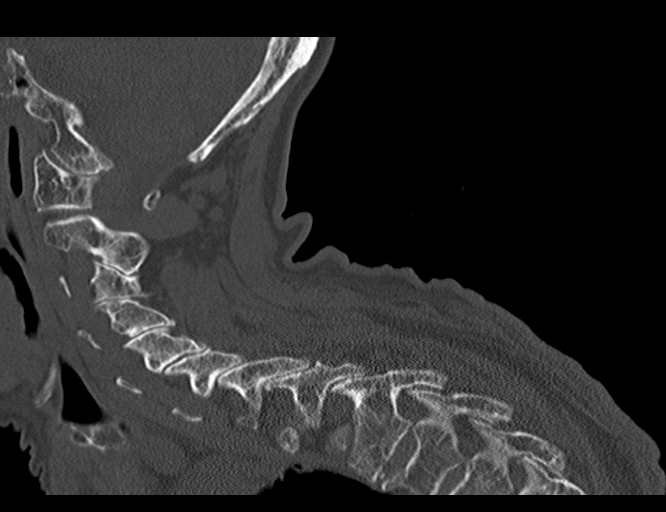
[im 31/61  soft-tissue]
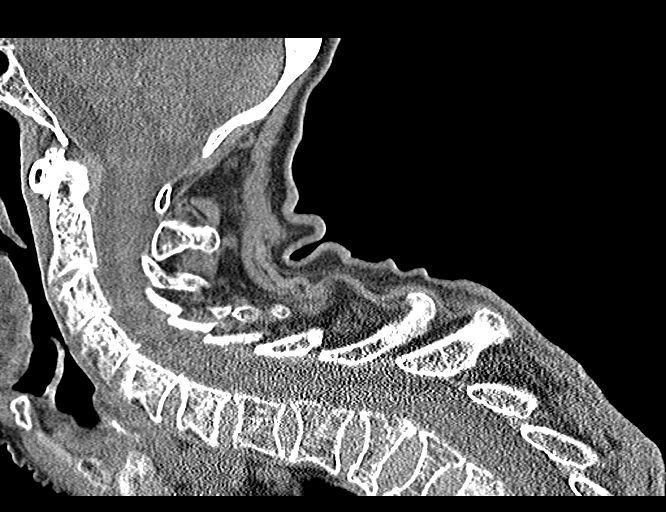
[im 31/61  bone]
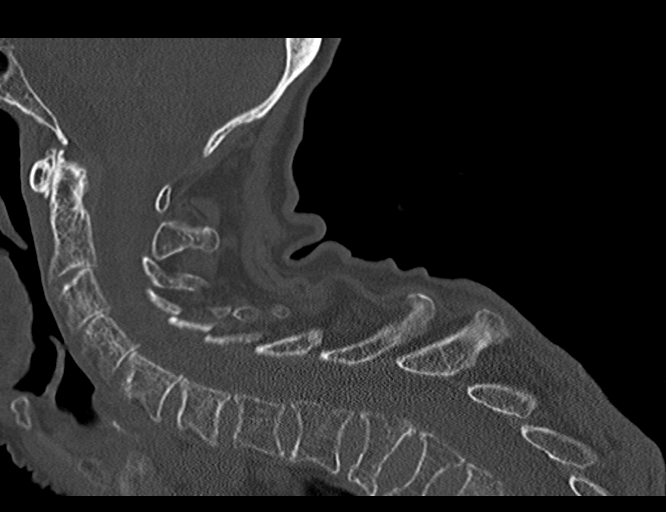
[im 36/61  bone]
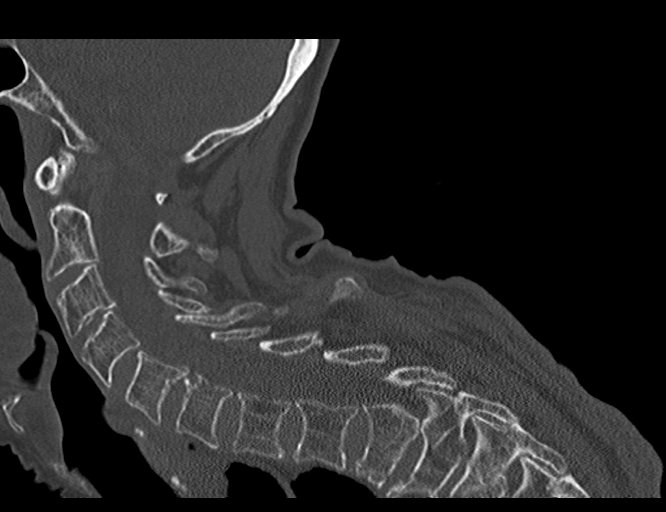
[im 41/61  bone]
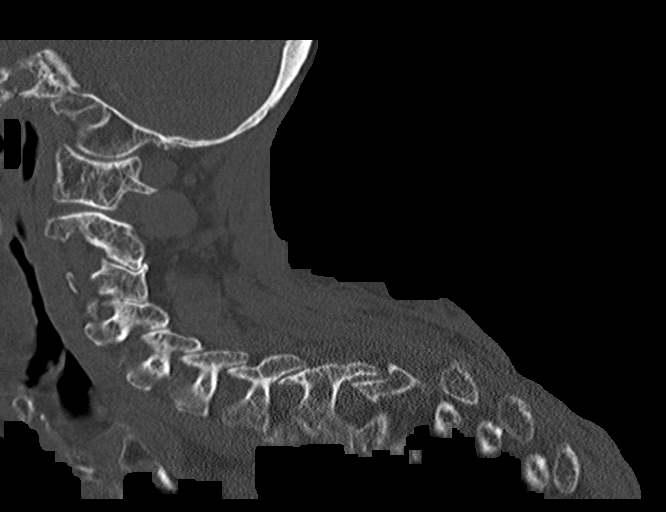

[Series 10: coronal bone · coronal · 0.23mm/px · 3 of 67 slices shown]
[im 20/67  bone]
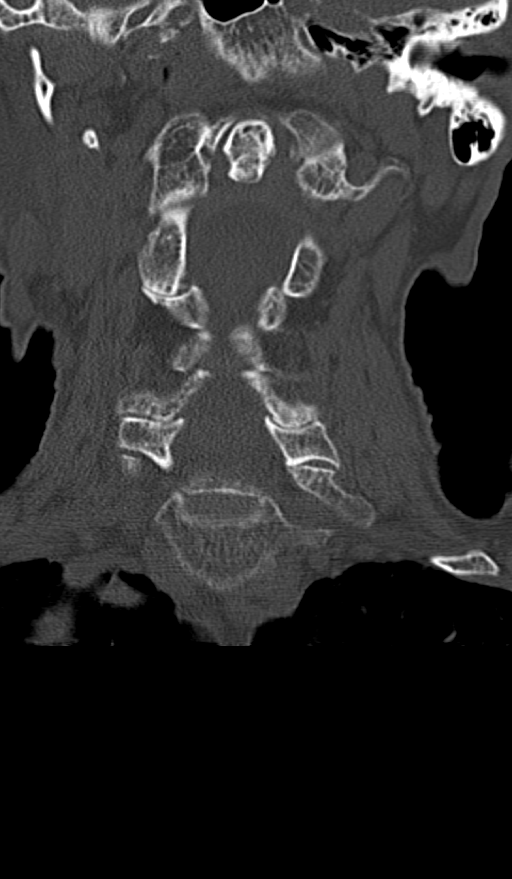
[im 29/67  bone]
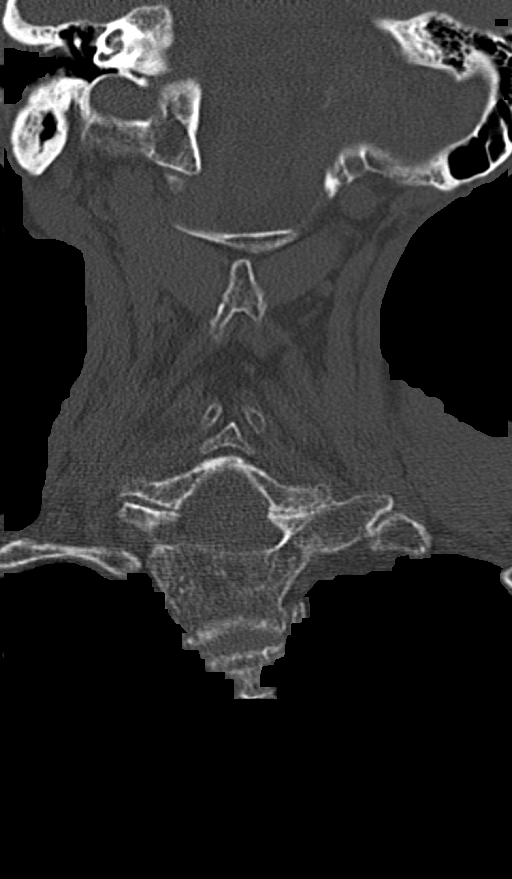
[im 38/67  bone]
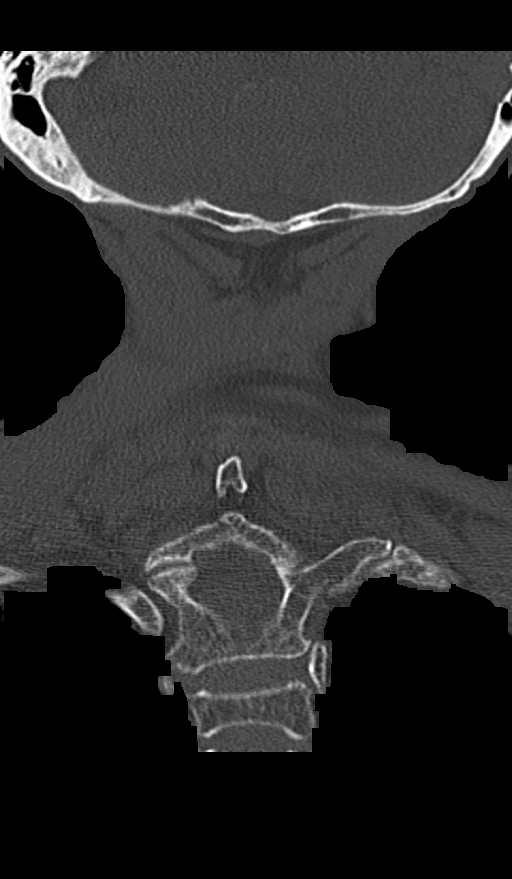

[Series 11: orthogonal axials · axial · 0.21mm/px · z∈[-128,-17]mm · 4 of 113 slices shown, 5 images]
[im 15/113  soft-tissue]
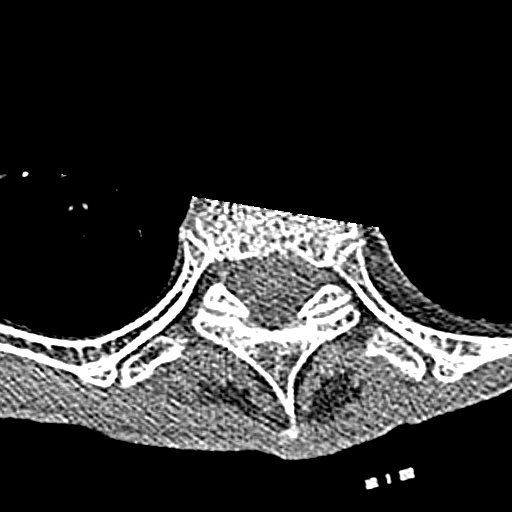
[im 15/113  bone]
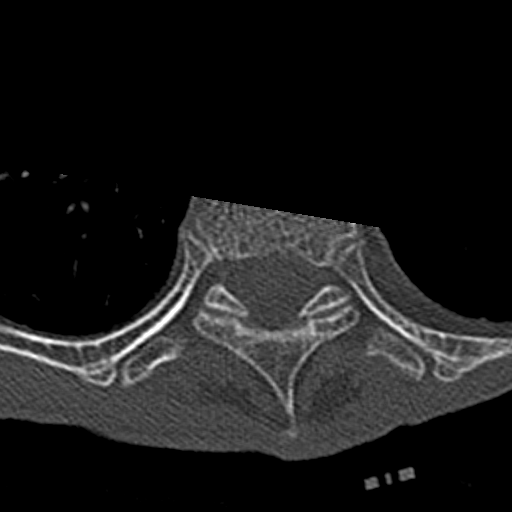
[im 43/113  bone]
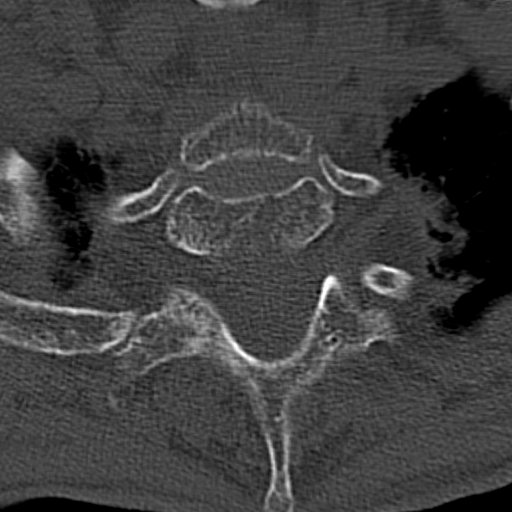
[im 71/113  bone]
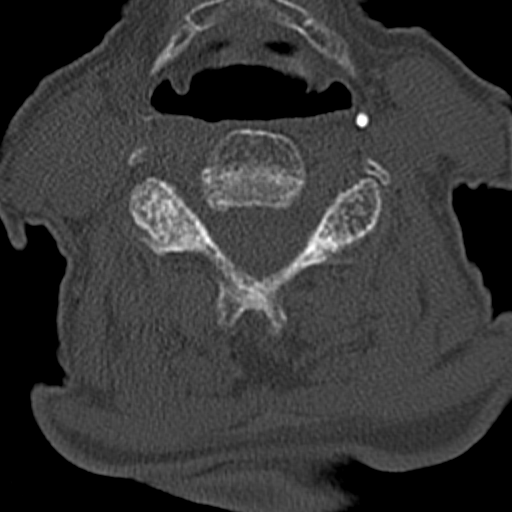
[im 99/113  bone]
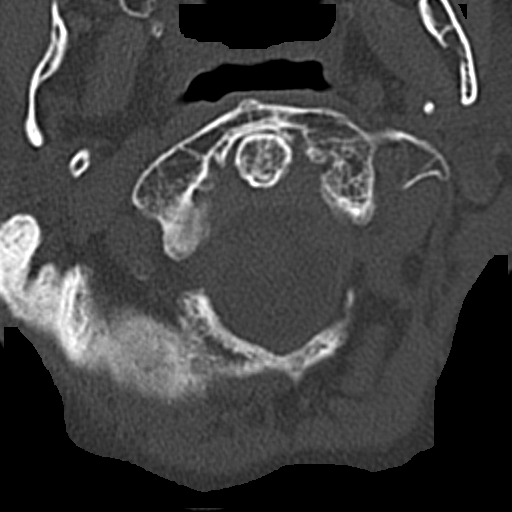

[12 of 33 positions shown; findings below may reference images not displayed]

FINDINGS: CT HEAD FINDINGS

Brain: Stable age related cerebral atrophy, ventriculomegaly and
periventricular white matter disease. Stable bilateral basal ganglia
calcifications. No extra-axial fluid collections are identified. No
CT findings for acute hemispheric infarction or intracranial
hemorrhage. No mass lesions. The brainstem and cerebellum are
normal.

Vascular: Stable vascular calcifications. No aneurysm or hyperdense
vessels.

Skull: No skull fracture or bone lesions.

Sinuses/Orbits: The paranasal sinuses and mastoid air cells are
clear.

Other: No scalp lesions or scalp hematoma.

CT CERVICAL SPINE FINDINGS

Alignment: The cervical vertebral bodies are normally aligned. There
is a markedly exaggerated cervical lordosis due to a severe thoracic
kyphosis.

Skull base and vertebrae: No acute fracture. No primary bone lesion
or focal pathologic process.

Soft tissues and spinal canal: No prevertebral fluid or swelling. No
visible canal hematoma.

Disc levels: Very generous spinal canal. No spinal stenosis. Mild
facet disease and uncinate spurring but no significant foraminal
stenosis.

Upper chest: Emphysematous changes are noted at the lung apices
along with pleural and parenchymal scarring but no pulmonary
lesions.

Other: No neck mass or adenopathy. There are changes of osteoporosis
and numerous upper thoracic compression deformities.
IMPRESSION: 1. Stable age related cerebral atrophy, ventriculomegaly and
periventricular white matter disease.
2. No acute intracranial findings or skull fracture.
3. No acute cervical spine fracture or subluxation.

## 2022-07-18 IMAGING — DX DG HAND COMPLETE 3+V*R*
3 series · 3 of 3 positions shown · non-contrast
Comparison: None.

CLINICAL DATA: Fall, fourth and fifth finger pain, initial
encounter.

EXAM:
RIGHT HAND - COMPLETE 3+ VIEW

[hand ap]
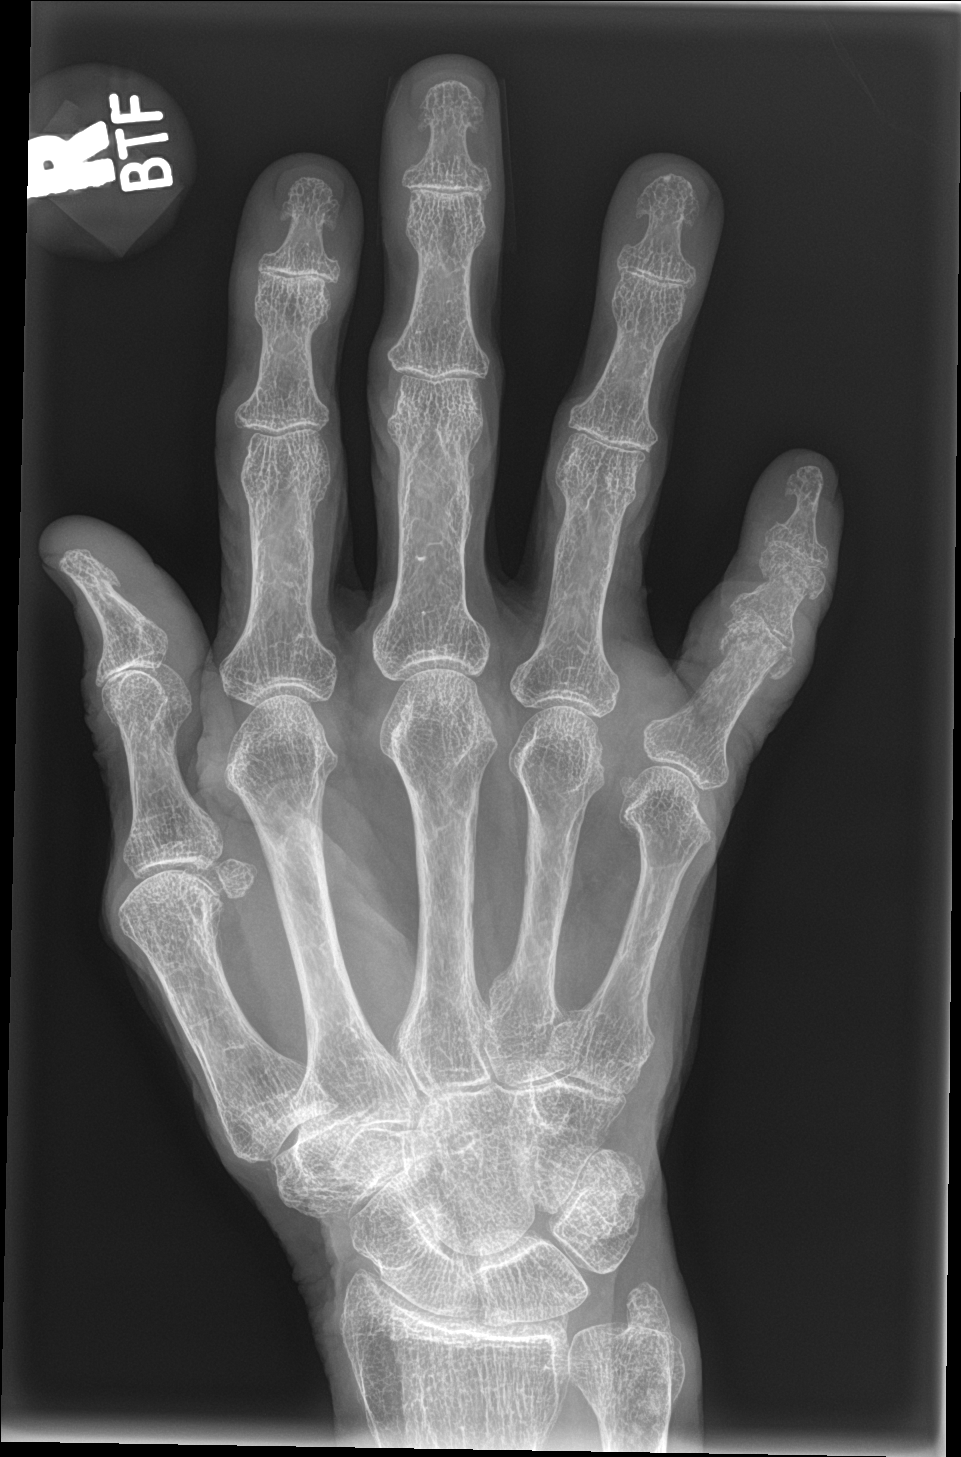

[hand obl]
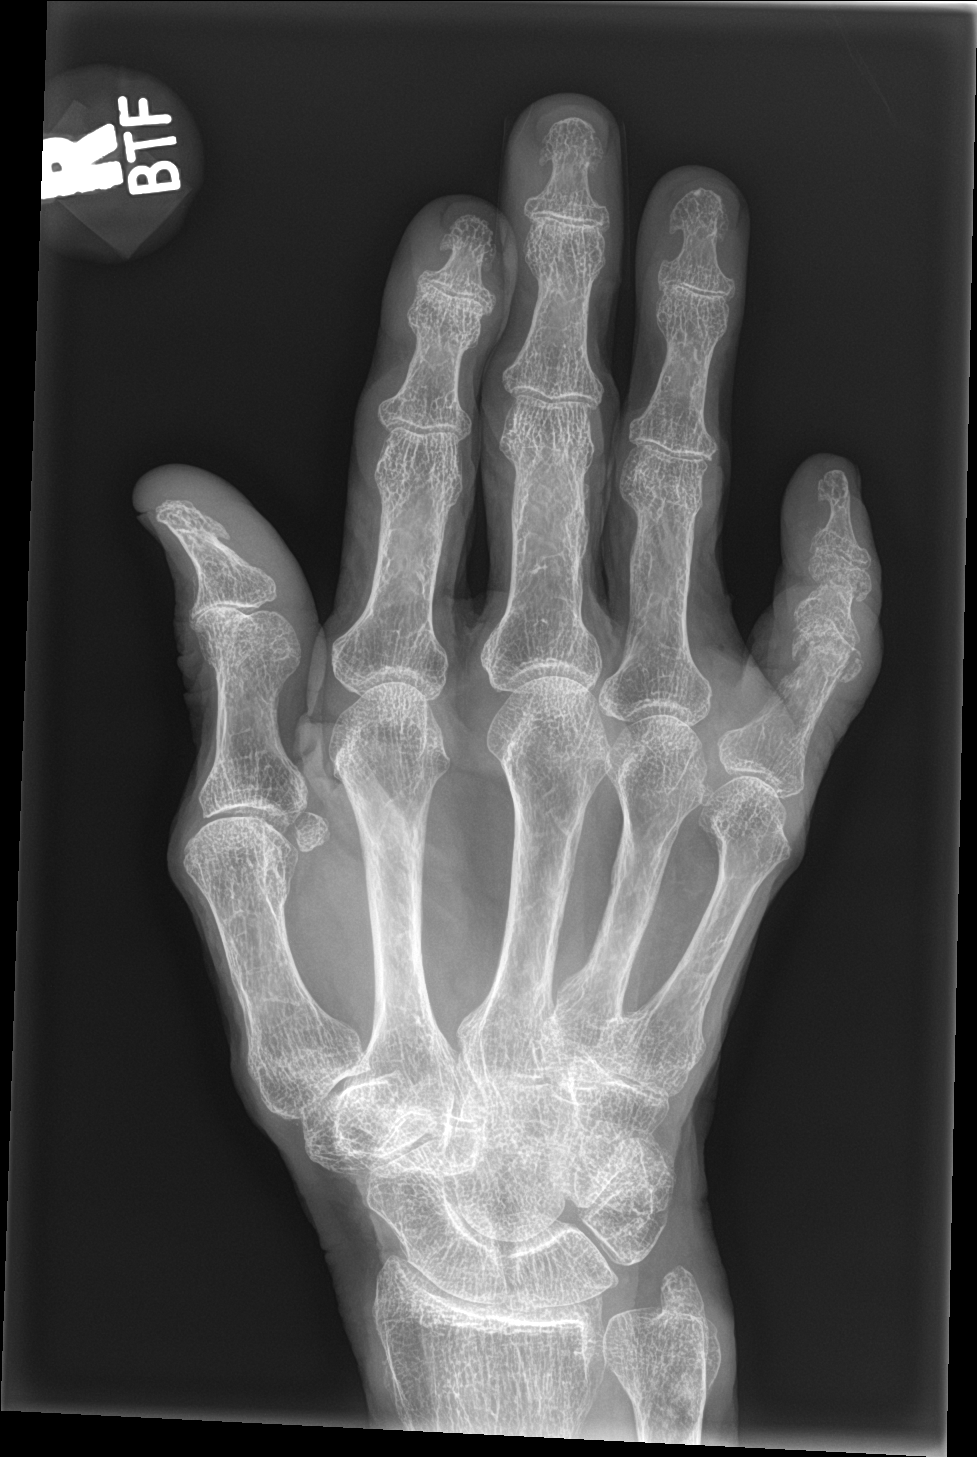

[hand lat]
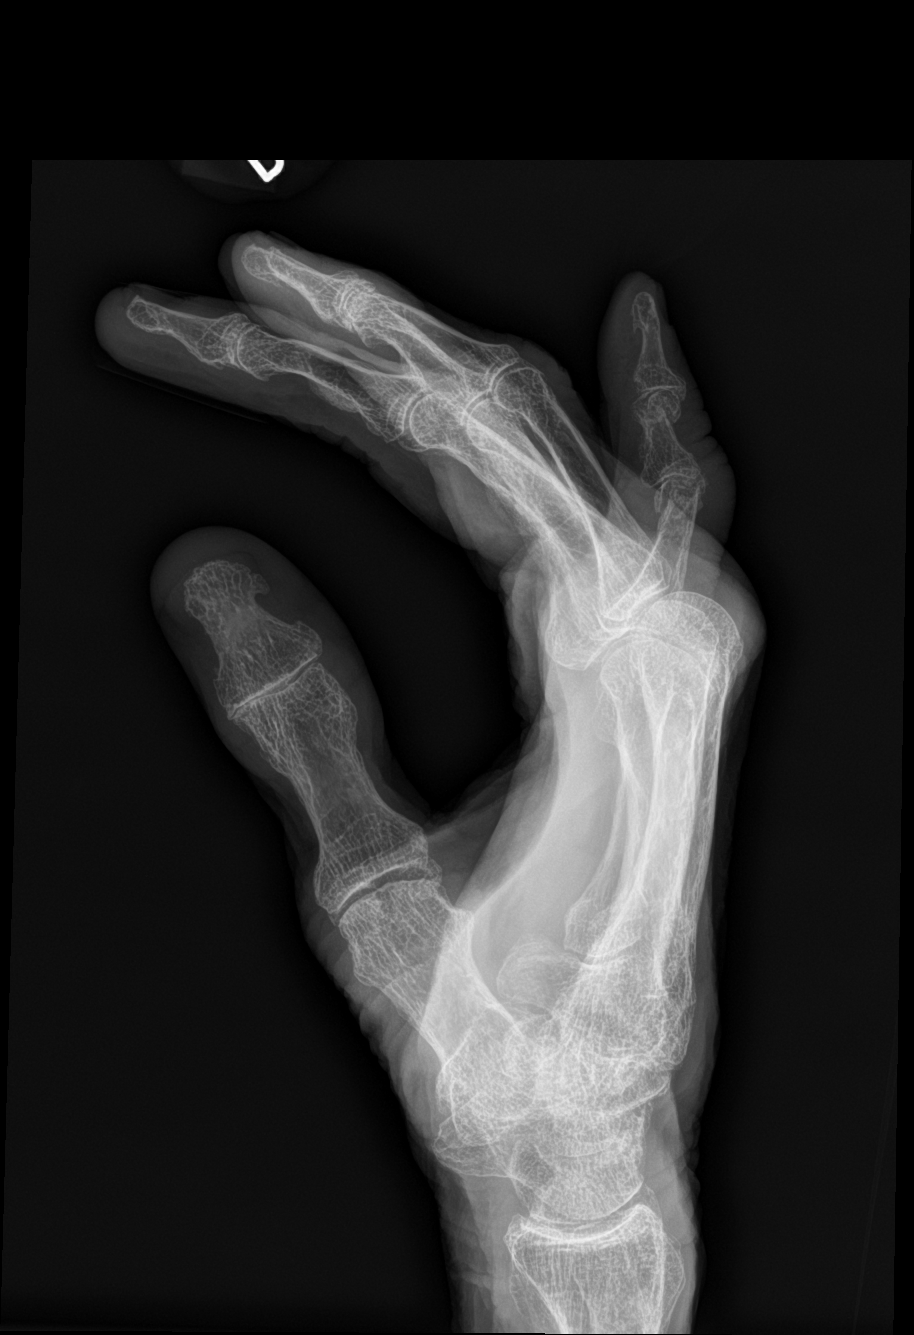

[3 of 3 positions shown; findings below may reference images not displayed]

FINDINGS: Osteopenia. Impacted fractures involving the heads/necks of the
fifth proximal and middle phalanges. No additional evidence of acute
fracture.

Degenerative changes in the distal interphalangeal joints.
IMPRESSION: 1. Impacted fractures involving the heads/necks of the fifth
proximal and middle phalanges.
2. Osteopenia.
3. DIP joint osteoarthritis.

## 2022-07-19 DIAGNOSIS — F09 Unspecified mental disorder due to known physiological condition: Secondary | ICD-10-CM | POA: Diagnosis not present

## 2022-07-25 DIAGNOSIS — W19XXXD Unspecified fall, subsequent encounter: Secondary | ICD-10-CM | POA: Diagnosis not present

## 2022-07-25 DIAGNOSIS — S6291XD Unspecified fracture of right wrist and hand, subsequent encounter for fracture with routine healing: Secondary | ICD-10-CM | POA: Diagnosis not present

## 2022-07-25 DIAGNOSIS — F0390 Unspecified dementia without behavioral disturbance: Secondary | ICD-10-CM | POA: Diagnosis not present

## 2022-07-25 DIAGNOSIS — F411 Generalized anxiety disorder: Secondary | ICD-10-CM | POA: Diagnosis not present

## 2022-07-25 DIAGNOSIS — E46 Unspecified protein-calorie malnutrition: Secondary | ICD-10-CM | POA: Diagnosis not present

## 2022-07-25 DIAGNOSIS — E785 Hyperlipidemia, unspecified: Secondary | ICD-10-CM | POA: Diagnosis not present

## 2022-07-26 DIAGNOSIS — W19XXXD Unspecified fall, subsequent encounter: Secondary | ICD-10-CM | POA: Diagnosis not present

## 2022-07-26 DIAGNOSIS — E785 Hyperlipidemia, unspecified: Secondary | ICD-10-CM | POA: Diagnosis not present

## 2022-07-26 DIAGNOSIS — F411 Generalized anxiety disorder: Secondary | ICD-10-CM | POA: Diagnosis not present

## 2022-07-26 DIAGNOSIS — F0390 Unspecified dementia without behavioral disturbance: Secondary | ICD-10-CM | POA: Diagnosis not present

## 2022-07-26 DIAGNOSIS — E46 Unspecified protein-calorie malnutrition: Secondary | ICD-10-CM | POA: Diagnosis not present

## 2022-07-26 DIAGNOSIS — S6291XD Unspecified fracture of right wrist and hand, subsequent encounter for fracture with routine healing: Secondary | ICD-10-CM | POA: Diagnosis not present

## 2022-07-30 DIAGNOSIS — E785 Hyperlipidemia, unspecified: Secondary | ICD-10-CM | POA: Diagnosis not present

## 2022-07-30 DIAGNOSIS — E46 Unspecified protein-calorie malnutrition: Secondary | ICD-10-CM | POA: Diagnosis not present

## 2022-07-30 DIAGNOSIS — W19XXXD Unspecified fall, subsequent encounter: Secondary | ICD-10-CM | POA: Diagnosis not present

## 2022-07-30 DIAGNOSIS — F411 Generalized anxiety disorder: Secondary | ICD-10-CM | POA: Diagnosis not present

## 2022-07-30 DIAGNOSIS — F0390 Unspecified dementia without behavioral disturbance: Secondary | ICD-10-CM | POA: Diagnosis not present

## 2022-07-30 DIAGNOSIS — S6291XD Unspecified fracture of right wrist and hand, subsequent encounter for fracture with routine healing: Secondary | ICD-10-CM | POA: Diagnosis not present

## 2022-07-31 DIAGNOSIS — G894 Chronic pain syndrome: Secondary | ICD-10-CM | POA: Diagnosis not present

## 2022-07-31 DIAGNOSIS — F411 Generalized anxiety disorder: Secondary | ICD-10-CM | POA: Diagnosis not present

## 2022-07-31 DIAGNOSIS — I10 Essential (primary) hypertension: Secondary | ICD-10-CM | POA: Diagnosis not present

## 2022-08-01 DIAGNOSIS — F339 Major depressive disorder, recurrent, unspecified: Secondary | ICD-10-CM | POA: Diagnosis not present

## 2022-08-01 DIAGNOSIS — F01B0 Vascular dementia, moderate, without behavioral disturbance, psychotic disturbance, mood disturbance, and anxiety: Secondary | ICD-10-CM | POA: Diagnosis not present

## 2022-08-01 DIAGNOSIS — E46 Unspecified protein-calorie malnutrition: Secondary | ICD-10-CM | POA: Diagnosis not present

## 2022-08-01 DIAGNOSIS — S6291XD Unspecified fracture of right wrist and hand, subsequent encounter for fracture with routine healing: Secondary | ICD-10-CM | POA: Diagnosis not present

## 2022-08-01 DIAGNOSIS — F0390 Unspecified dementia without behavioral disturbance: Secondary | ICD-10-CM | POA: Diagnosis not present

## 2022-08-01 DIAGNOSIS — W19XXXD Unspecified fall, subsequent encounter: Secondary | ICD-10-CM | POA: Diagnosis not present

## 2022-08-01 DIAGNOSIS — E785 Hyperlipidemia, unspecified: Secondary | ICD-10-CM | POA: Diagnosis not present

## 2022-08-01 DIAGNOSIS — F411 Generalized anxiety disorder: Secondary | ICD-10-CM | POA: Diagnosis not present

## 2022-08-02 DIAGNOSIS — F411 Generalized anxiety disorder: Secondary | ICD-10-CM | POA: Diagnosis not present

## 2022-08-02 DIAGNOSIS — M6281 Muscle weakness (generalized): Secondary | ICD-10-CM | POA: Diagnosis not present

## 2022-08-02 DIAGNOSIS — W19XXXD Unspecified fall, subsequent encounter: Secondary | ICD-10-CM | POA: Diagnosis not present

## 2022-08-02 DIAGNOSIS — S6291XD Unspecified fracture of right wrist and hand, subsequent encounter for fracture with routine healing: Secondary | ICD-10-CM | POA: Diagnosis not present

## 2022-08-02 DIAGNOSIS — H543 Unqualified visual loss, both eyes: Secondary | ICD-10-CM | POA: Diagnosis not present

## 2022-08-02 DIAGNOSIS — F0390 Unspecified dementia without behavioral disturbance: Secondary | ICD-10-CM | POA: Diagnosis not present

## 2022-08-02 DIAGNOSIS — R531 Weakness: Secondary | ICD-10-CM | POA: Diagnosis not present

## 2022-08-02 DIAGNOSIS — J439 Emphysema, unspecified: Secondary | ICD-10-CM | POA: Diagnosis not present

## 2022-08-02 DIAGNOSIS — F339 Major depressive disorder, recurrent, unspecified: Secondary | ICD-10-CM | POA: Diagnosis not present

## 2022-08-02 DIAGNOSIS — I10 Essential (primary) hypertension: Secondary | ICD-10-CM | POA: Diagnosis not present

## 2022-08-02 DIAGNOSIS — E785 Hyperlipidemia, unspecified: Secondary | ICD-10-CM | POA: Diagnosis not present

## 2022-08-02 DIAGNOSIS — E46 Unspecified protein-calorie malnutrition: Secondary | ICD-10-CM | POA: Diagnosis not present

## 2022-08-02 DIAGNOSIS — R262 Difficulty in walking, not elsewhere classified: Secondary | ICD-10-CM | POA: Diagnosis not present

## 2022-08-06 DIAGNOSIS — F339 Major depressive disorder, recurrent, unspecified: Secondary | ICD-10-CM | POA: Diagnosis not present

## 2022-08-06 DIAGNOSIS — W19XXXD Unspecified fall, subsequent encounter: Secondary | ICD-10-CM | POA: Diagnosis not present

## 2022-08-06 DIAGNOSIS — F0390 Unspecified dementia without behavioral disturbance: Secondary | ICD-10-CM | POA: Diagnosis not present

## 2022-08-06 DIAGNOSIS — F411 Generalized anxiety disorder: Secondary | ICD-10-CM | POA: Diagnosis not present

## 2022-08-06 DIAGNOSIS — I10 Essential (primary) hypertension: Secondary | ICD-10-CM | POA: Diagnosis not present

## 2022-08-06 DIAGNOSIS — E46 Unspecified protein-calorie malnutrition: Secondary | ICD-10-CM | POA: Diagnosis not present

## 2022-08-06 DIAGNOSIS — S6291XD Unspecified fracture of right wrist and hand, subsequent encounter for fracture with routine healing: Secondary | ICD-10-CM | POA: Diagnosis not present

## 2022-08-06 DIAGNOSIS — E785 Hyperlipidemia, unspecified: Secondary | ICD-10-CM | POA: Diagnosis not present

## 2022-08-08 DIAGNOSIS — F411 Generalized anxiety disorder: Secondary | ICD-10-CM | POA: Diagnosis not present

## 2022-08-08 DIAGNOSIS — S6291XD Unspecified fracture of right wrist and hand, subsequent encounter for fracture with routine healing: Secondary | ICD-10-CM | POA: Diagnosis not present

## 2022-08-08 DIAGNOSIS — W19XXXD Unspecified fall, subsequent encounter: Secondary | ICD-10-CM | POA: Diagnosis not present

## 2022-08-08 DIAGNOSIS — E785 Hyperlipidemia, unspecified: Secondary | ICD-10-CM | POA: Diagnosis not present

## 2022-08-08 DIAGNOSIS — E46 Unspecified protein-calorie malnutrition: Secondary | ICD-10-CM | POA: Diagnosis not present

## 2022-08-08 DIAGNOSIS — F0390 Unspecified dementia without behavioral disturbance: Secondary | ICD-10-CM | POA: Diagnosis not present

## 2022-08-13 DIAGNOSIS — E46 Unspecified protein-calorie malnutrition: Secondary | ICD-10-CM | POA: Diagnosis not present

## 2022-08-13 DIAGNOSIS — S6291XD Unspecified fracture of right wrist and hand, subsequent encounter for fracture with routine healing: Secondary | ICD-10-CM | POA: Diagnosis not present

## 2022-08-13 DIAGNOSIS — F411 Generalized anxiety disorder: Secondary | ICD-10-CM | POA: Diagnosis not present

## 2022-08-13 DIAGNOSIS — W19XXXD Unspecified fall, subsequent encounter: Secondary | ICD-10-CM | POA: Diagnosis not present

## 2022-08-13 DIAGNOSIS — E785 Hyperlipidemia, unspecified: Secondary | ICD-10-CM | POA: Diagnosis not present

## 2022-08-13 DIAGNOSIS — F0390 Unspecified dementia without behavioral disturbance: Secondary | ICD-10-CM | POA: Diagnosis not present

## 2022-08-15 DIAGNOSIS — F411 Generalized anxiety disorder: Secondary | ICD-10-CM | POA: Diagnosis not present

## 2022-08-15 DIAGNOSIS — W19XXXD Unspecified fall, subsequent encounter: Secondary | ICD-10-CM | POA: Diagnosis not present

## 2022-08-15 DIAGNOSIS — E46 Unspecified protein-calorie malnutrition: Secondary | ICD-10-CM | POA: Diagnosis not present

## 2022-08-15 DIAGNOSIS — E785 Hyperlipidemia, unspecified: Secondary | ICD-10-CM | POA: Diagnosis not present

## 2022-08-15 DIAGNOSIS — F0390 Unspecified dementia without behavioral disturbance: Secondary | ICD-10-CM | POA: Diagnosis not present

## 2022-08-15 DIAGNOSIS — S6291XD Unspecified fracture of right wrist and hand, subsequent encounter for fracture with routine healing: Secondary | ICD-10-CM | POA: Diagnosis not present

## 2022-08-20 DIAGNOSIS — F411 Generalized anxiety disorder: Secondary | ICD-10-CM | POA: Diagnosis not present

## 2022-08-20 DIAGNOSIS — E785 Hyperlipidemia, unspecified: Secondary | ICD-10-CM | POA: Diagnosis not present

## 2022-08-20 DIAGNOSIS — W19XXXD Unspecified fall, subsequent encounter: Secondary | ICD-10-CM | POA: Diagnosis not present

## 2022-08-20 DIAGNOSIS — S6291XD Unspecified fracture of right wrist and hand, subsequent encounter for fracture with routine healing: Secondary | ICD-10-CM | POA: Diagnosis not present

## 2022-08-20 DIAGNOSIS — F0390 Unspecified dementia without behavioral disturbance: Secondary | ICD-10-CM | POA: Diagnosis not present

## 2022-08-20 DIAGNOSIS — E46 Unspecified protein-calorie malnutrition: Secondary | ICD-10-CM | POA: Diagnosis not present

## 2022-08-22 DIAGNOSIS — E785 Hyperlipidemia, unspecified: Secondary | ICD-10-CM | POA: Diagnosis not present

## 2022-08-22 DIAGNOSIS — S6291XD Unspecified fracture of right wrist and hand, subsequent encounter for fracture with routine healing: Secondary | ICD-10-CM | POA: Diagnosis not present

## 2022-08-22 DIAGNOSIS — F0390 Unspecified dementia without behavioral disturbance: Secondary | ICD-10-CM | POA: Diagnosis not present

## 2022-08-22 DIAGNOSIS — B351 Tinea unguium: Secondary | ICD-10-CM | POA: Diagnosis not present

## 2022-08-22 DIAGNOSIS — W19XXXD Unspecified fall, subsequent encounter: Secondary | ICD-10-CM | POA: Diagnosis not present

## 2022-08-22 DIAGNOSIS — I10 Essential (primary) hypertension: Secondary | ICD-10-CM | POA: Diagnosis not present

## 2022-08-22 DIAGNOSIS — E46 Unspecified protein-calorie malnutrition: Secondary | ICD-10-CM | POA: Diagnosis not present

## 2022-08-22 DIAGNOSIS — F411 Generalized anxiety disorder: Secondary | ICD-10-CM | POA: Diagnosis not present

## 2022-08-22 DIAGNOSIS — G894 Chronic pain syndrome: Secondary | ICD-10-CM | POA: Diagnosis not present

## 2022-08-22 DIAGNOSIS — I7091 Generalized atherosclerosis: Secondary | ICD-10-CM | POA: Diagnosis not present

## 2022-08-27 DIAGNOSIS — F0390 Unspecified dementia without behavioral disturbance: Secondary | ICD-10-CM | POA: Diagnosis not present

## 2022-08-27 DIAGNOSIS — E46 Unspecified protein-calorie malnutrition: Secondary | ICD-10-CM | POA: Diagnosis not present

## 2022-08-27 DIAGNOSIS — E785 Hyperlipidemia, unspecified: Secondary | ICD-10-CM | POA: Diagnosis not present

## 2022-08-27 DIAGNOSIS — W19XXXD Unspecified fall, subsequent encounter: Secondary | ICD-10-CM | POA: Diagnosis not present

## 2022-08-27 DIAGNOSIS — S6291XD Unspecified fracture of right wrist and hand, subsequent encounter for fracture with routine healing: Secondary | ICD-10-CM | POA: Diagnosis not present

## 2022-08-27 DIAGNOSIS — F411 Generalized anxiety disorder: Secondary | ICD-10-CM | POA: Diagnosis not present

## 2022-08-29 DIAGNOSIS — W19XXXD Unspecified fall, subsequent encounter: Secondary | ICD-10-CM | POA: Diagnosis not present

## 2022-08-29 DIAGNOSIS — F0390 Unspecified dementia without behavioral disturbance: Secondary | ICD-10-CM | POA: Diagnosis not present

## 2022-08-29 DIAGNOSIS — S6291XD Unspecified fracture of right wrist and hand, subsequent encounter for fracture with routine healing: Secondary | ICD-10-CM | POA: Diagnosis not present

## 2022-08-29 DIAGNOSIS — F411 Generalized anxiety disorder: Secondary | ICD-10-CM | POA: Diagnosis not present

## 2022-08-29 DIAGNOSIS — E46 Unspecified protein-calorie malnutrition: Secondary | ICD-10-CM | POA: Diagnosis not present

## 2022-08-29 DIAGNOSIS — E785 Hyperlipidemia, unspecified: Secondary | ICD-10-CM | POA: Diagnosis not present

## 2022-09-02 DIAGNOSIS — M6281 Muscle weakness (generalized): Secondary | ICD-10-CM | POA: Diagnosis not present

## 2022-09-02 DIAGNOSIS — W19XXXD Unspecified fall, subsequent encounter: Secondary | ICD-10-CM | POA: Diagnosis not present

## 2022-09-02 DIAGNOSIS — F411 Generalized anxiety disorder: Secondary | ICD-10-CM | POA: Diagnosis not present

## 2022-09-02 DIAGNOSIS — R531 Weakness: Secondary | ICD-10-CM | POA: Diagnosis not present

## 2022-09-02 DIAGNOSIS — F339 Major depressive disorder, recurrent, unspecified: Secondary | ICD-10-CM | POA: Diagnosis not present

## 2022-09-02 DIAGNOSIS — J439 Emphysema, unspecified: Secondary | ICD-10-CM | POA: Diagnosis not present

## 2022-09-02 DIAGNOSIS — H543 Unqualified visual loss, both eyes: Secondary | ICD-10-CM | POA: Diagnosis not present

## 2022-09-02 DIAGNOSIS — F0390 Unspecified dementia without behavioral disturbance: Secondary | ICD-10-CM | POA: Diagnosis not present

## 2022-09-02 DIAGNOSIS — S6291XD Unspecified fracture of right wrist and hand, subsequent encounter for fracture with routine healing: Secondary | ICD-10-CM | POA: Diagnosis not present

## 2022-09-02 DIAGNOSIS — I10 Essential (primary) hypertension: Secondary | ICD-10-CM | POA: Diagnosis not present

## 2022-09-02 DIAGNOSIS — E46 Unspecified protein-calorie malnutrition: Secondary | ICD-10-CM | POA: Diagnosis not present

## 2022-09-02 DIAGNOSIS — R262 Difficulty in walking, not elsewhere classified: Secondary | ICD-10-CM | POA: Diagnosis not present

## 2022-09-02 DIAGNOSIS — E785 Hyperlipidemia, unspecified: Secondary | ICD-10-CM | POA: Diagnosis not present

## 2022-09-03 DIAGNOSIS — W19XXXD Unspecified fall, subsequent encounter: Secondary | ICD-10-CM | POA: Diagnosis not present

## 2022-09-03 DIAGNOSIS — E785 Hyperlipidemia, unspecified: Secondary | ICD-10-CM | POA: Diagnosis not present

## 2022-09-03 DIAGNOSIS — F0390 Unspecified dementia without behavioral disturbance: Secondary | ICD-10-CM | POA: Diagnosis not present

## 2022-09-03 DIAGNOSIS — E46 Unspecified protein-calorie malnutrition: Secondary | ICD-10-CM | POA: Diagnosis not present

## 2022-09-03 DIAGNOSIS — S6291XD Unspecified fracture of right wrist and hand, subsequent encounter for fracture with routine healing: Secondary | ICD-10-CM | POA: Diagnosis not present

## 2022-09-03 DIAGNOSIS — F411 Generalized anxiety disorder: Secondary | ICD-10-CM | POA: Diagnosis not present

## 2022-09-05 DIAGNOSIS — S6291XD Unspecified fracture of right wrist and hand, subsequent encounter for fracture with routine healing: Secondary | ICD-10-CM | POA: Diagnosis not present

## 2022-09-05 DIAGNOSIS — E785 Hyperlipidemia, unspecified: Secondary | ICD-10-CM | POA: Diagnosis not present

## 2022-09-05 DIAGNOSIS — E46 Unspecified protein-calorie malnutrition: Secondary | ICD-10-CM | POA: Diagnosis not present

## 2022-09-05 DIAGNOSIS — F411 Generalized anxiety disorder: Secondary | ICD-10-CM | POA: Diagnosis not present

## 2022-09-05 DIAGNOSIS — F0390 Unspecified dementia without behavioral disturbance: Secondary | ICD-10-CM | POA: Diagnosis not present

## 2022-09-05 DIAGNOSIS — W19XXXD Unspecified fall, subsequent encounter: Secondary | ICD-10-CM | POA: Diagnosis not present

## 2022-09-10 DIAGNOSIS — F32A Depression, unspecified: Secondary | ICD-10-CM | POA: Diagnosis not present

## 2022-09-10 DIAGNOSIS — E43 Unspecified severe protein-calorie malnutrition: Secondary | ICD-10-CM | POA: Diagnosis not present

## 2022-09-10 DIAGNOSIS — W19XXXD Unspecified fall, subsequent encounter: Secondary | ICD-10-CM | POA: Diagnosis not present

## 2022-09-10 DIAGNOSIS — S6291XD Unspecified fracture of right wrist and hand, subsequent encounter for fracture with routine healing: Secondary | ICD-10-CM | POA: Diagnosis not present

## 2022-09-10 DIAGNOSIS — E785 Hyperlipidemia, unspecified: Secondary | ICD-10-CM | POA: Diagnosis not present

## 2022-09-10 DIAGNOSIS — E46 Unspecified protein-calorie malnutrition: Secondary | ICD-10-CM | POA: Diagnosis not present

## 2022-09-10 DIAGNOSIS — F411 Generalized anxiety disorder: Secondary | ICD-10-CM | POA: Diagnosis not present

## 2022-09-10 DIAGNOSIS — F0390 Unspecified dementia without behavioral disturbance: Secondary | ICD-10-CM | POA: Diagnosis not present

## 2022-09-10 DIAGNOSIS — I10 Essential (primary) hypertension: Secondary | ICD-10-CM | POA: Diagnosis not present

## 2022-09-12 DIAGNOSIS — E46 Unspecified protein-calorie malnutrition: Secondary | ICD-10-CM | POA: Diagnosis not present

## 2022-09-12 DIAGNOSIS — E785 Hyperlipidemia, unspecified: Secondary | ICD-10-CM | POA: Diagnosis not present

## 2022-09-12 DIAGNOSIS — F411 Generalized anxiety disorder: Secondary | ICD-10-CM | POA: Diagnosis not present

## 2022-09-12 DIAGNOSIS — S6291XD Unspecified fracture of right wrist and hand, subsequent encounter for fracture with routine healing: Secondary | ICD-10-CM | POA: Diagnosis not present

## 2022-09-12 DIAGNOSIS — W19XXXD Unspecified fall, subsequent encounter: Secondary | ICD-10-CM | POA: Diagnosis not present

## 2022-09-12 DIAGNOSIS — F0390 Unspecified dementia without behavioral disturbance: Secondary | ICD-10-CM | POA: Diagnosis not present

## 2022-09-14 DIAGNOSIS — F411 Generalized anxiety disorder: Secondary | ICD-10-CM | POA: Diagnosis not present

## 2022-09-14 DIAGNOSIS — F339 Major depressive disorder, recurrent, unspecified: Secondary | ICD-10-CM | POA: Diagnosis not present

## 2022-09-17 DIAGNOSIS — F411 Generalized anxiety disorder: Secondary | ICD-10-CM | POA: Diagnosis not present

## 2022-09-17 DIAGNOSIS — S6291XD Unspecified fracture of right wrist and hand, subsequent encounter for fracture with routine healing: Secondary | ICD-10-CM | POA: Diagnosis not present

## 2022-09-17 DIAGNOSIS — W19XXXD Unspecified fall, subsequent encounter: Secondary | ICD-10-CM | POA: Diagnosis not present

## 2022-09-17 DIAGNOSIS — F0390 Unspecified dementia without behavioral disturbance: Secondary | ICD-10-CM | POA: Diagnosis not present

## 2022-09-17 DIAGNOSIS — E46 Unspecified protein-calorie malnutrition: Secondary | ICD-10-CM | POA: Diagnosis not present

## 2022-09-17 DIAGNOSIS — E785 Hyperlipidemia, unspecified: Secondary | ICD-10-CM | POA: Diagnosis not present

## 2022-09-19 DIAGNOSIS — E785 Hyperlipidemia, unspecified: Secondary | ICD-10-CM | POA: Diagnosis not present

## 2022-09-19 DIAGNOSIS — F0390 Unspecified dementia without behavioral disturbance: Secondary | ICD-10-CM | POA: Diagnosis not present

## 2022-09-19 DIAGNOSIS — F339 Major depressive disorder, recurrent, unspecified: Secondary | ICD-10-CM | POA: Diagnosis not present

## 2022-09-19 DIAGNOSIS — S6291XD Unspecified fracture of right wrist and hand, subsequent encounter for fracture with routine healing: Secondary | ICD-10-CM | POA: Diagnosis not present

## 2022-09-19 DIAGNOSIS — E46 Unspecified protein-calorie malnutrition: Secondary | ICD-10-CM | POA: Diagnosis not present

## 2022-09-19 DIAGNOSIS — F411 Generalized anxiety disorder: Secondary | ICD-10-CM | POA: Diagnosis not present

## 2022-09-19 DIAGNOSIS — F01B0 Vascular dementia, moderate, without behavioral disturbance, psychotic disturbance, mood disturbance, and anxiety: Secondary | ICD-10-CM | POA: Diagnosis not present

## 2022-09-19 DIAGNOSIS — W19XXXD Unspecified fall, subsequent encounter: Secondary | ICD-10-CM | POA: Diagnosis not present

## 2022-09-24 DIAGNOSIS — F411 Generalized anxiety disorder: Secondary | ICD-10-CM | POA: Diagnosis not present

## 2022-09-24 DIAGNOSIS — F0390 Unspecified dementia without behavioral disturbance: Secondary | ICD-10-CM | POA: Diagnosis not present

## 2022-09-24 DIAGNOSIS — W19XXXD Unspecified fall, subsequent encounter: Secondary | ICD-10-CM | POA: Diagnosis not present

## 2022-09-24 DIAGNOSIS — E785 Hyperlipidemia, unspecified: Secondary | ICD-10-CM | POA: Diagnosis not present

## 2022-09-24 DIAGNOSIS — E46 Unspecified protein-calorie malnutrition: Secondary | ICD-10-CM | POA: Diagnosis not present

## 2022-09-24 DIAGNOSIS — S6291XD Unspecified fracture of right wrist and hand, subsequent encounter for fracture with routine healing: Secondary | ICD-10-CM | POA: Diagnosis not present

## 2022-09-26 DIAGNOSIS — W19XXXD Unspecified fall, subsequent encounter: Secondary | ICD-10-CM | POA: Diagnosis not present

## 2022-09-26 DIAGNOSIS — F0390 Unspecified dementia without behavioral disturbance: Secondary | ICD-10-CM | POA: Diagnosis not present

## 2022-09-26 DIAGNOSIS — S6291XD Unspecified fracture of right wrist and hand, subsequent encounter for fracture with routine healing: Secondary | ICD-10-CM | POA: Diagnosis not present

## 2022-09-26 DIAGNOSIS — E46 Unspecified protein-calorie malnutrition: Secondary | ICD-10-CM | POA: Diagnosis not present

## 2022-09-26 DIAGNOSIS — F411 Generalized anxiety disorder: Secondary | ICD-10-CM | POA: Diagnosis not present

## 2022-09-26 DIAGNOSIS — E785 Hyperlipidemia, unspecified: Secondary | ICD-10-CM | POA: Diagnosis not present

## 2022-10-02 DIAGNOSIS — W19XXXD Unspecified fall, subsequent encounter: Secondary | ICD-10-CM | POA: Diagnosis not present

## 2022-10-02 DIAGNOSIS — M6281 Muscle weakness (generalized): Secondary | ICD-10-CM | POA: Diagnosis not present

## 2022-10-02 DIAGNOSIS — R262 Difficulty in walking, not elsewhere classified: Secondary | ICD-10-CM | POA: Diagnosis not present

## 2022-10-02 DIAGNOSIS — E46 Unspecified protein-calorie malnutrition: Secondary | ICD-10-CM | POA: Diagnosis not present

## 2022-10-02 DIAGNOSIS — R531 Weakness: Secondary | ICD-10-CM | POA: Diagnosis not present

## 2022-10-02 DIAGNOSIS — H543 Unqualified visual loss, both eyes: Secondary | ICD-10-CM | POA: Diagnosis not present

## 2022-10-02 DIAGNOSIS — S6291XD Unspecified fracture of right wrist and hand, subsequent encounter for fracture with routine healing: Secondary | ICD-10-CM | POA: Diagnosis not present

## 2022-10-02 DIAGNOSIS — F0390 Unspecified dementia without behavioral disturbance: Secondary | ICD-10-CM | POA: Diagnosis not present

## 2022-10-02 DIAGNOSIS — J439 Emphysema, unspecified: Secondary | ICD-10-CM | POA: Diagnosis not present

## 2022-10-02 DIAGNOSIS — I10 Essential (primary) hypertension: Secondary | ICD-10-CM | POA: Diagnosis not present

## 2022-10-02 DIAGNOSIS — E785 Hyperlipidemia, unspecified: Secondary | ICD-10-CM | POA: Diagnosis not present

## 2022-10-02 DIAGNOSIS — F411 Generalized anxiety disorder: Secondary | ICD-10-CM | POA: Diagnosis not present

## 2022-10-02 DIAGNOSIS — F339 Major depressive disorder, recurrent, unspecified: Secondary | ICD-10-CM | POA: Diagnosis not present

## 2022-10-03 DIAGNOSIS — F0390 Unspecified dementia without behavioral disturbance: Secondary | ICD-10-CM | POA: Diagnosis not present

## 2022-10-03 DIAGNOSIS — F411 Generalized anxiety disorder: Secondary | ICD-10-CM | POA: Diagnosis not present

## 2022-10-03 DIAGNOSIS — S6291XD Unspecified fracture of right wrist and hand, subsequent encounter for fracture with routine healing: Secondary | ICD-10-CM | POA: Diagnosis not present

## 2022-10-03 DIAGNOSIS — W19XXXD Unspecified fall, subsequent encounter: Secondary | ICD-10-CM | POA: Diagnosis not present

## 2022-10-03 DIAGNOSIS — E46 Unspecified protein-calorie malnutrition: Secondary | ICD-10-CM | POA: Diagnosis not present

## 2022-10-03 DIAGNOSIS — E785 Hyperlipidemia, unspecified: Secondary | ICD-10-CM | POA: Diagnosis not present

## 2022-10-08 DIAGNOSIS — S6291XD Unspecified fracture of right wrist and hand, subsequent encounter for fracture with routine healing: Secondary | ICD-10-CM | POA: Diagnosis not present

## 2022-10-08 DIAGNOSIS — F0390 Unspecified dementia without behavioral disturbance: Secondary | ICD-10-CM | POA: Diagnosis not present

## 2022-10-08 DIAGNOSIS — E46 Unspecified protein-calorie malnutrition: Secondary | ICD-10-CM | POA: Diagnosis not present

## 2022-10-08 DIAGNOSIS — F411 Generalized anxiety disorder: Secondary | ICD-10-CM | POA: Diagnosis not present

## 2022-10-08 DIAGNOSIS — W19XXXD Unspecified fall, subsequent encounter: Secondary | ICD-10-CM | POA: Diagnosis not present

## 2022-10-08 DIAGNOSIS — E785 Hyperlipidemia, unspecified: Secondary | ICD-10-CM | POA: Diagnosis not present

## 2022-10-10 DIAGNOSIS — E46 Unspecified protein-calorie malnutrition: Secondary | ICD-10-CM | POA: Diagnosis not present

## 2022-10-10 DIAGNOSIS — W19XXXD Unspecified fall, subsequent encounter: Secondary | ICD-10-CM | POA: Diagnosis not present

## 2022-10-10 DIAGNOSIS — S6291XD Unspecified fracture of right wrist and hand, subsequent encounter for fracture with routine healing: Secondary | ICD-10-CM | POA: Diagnosis not present

## 2022-10-10 DIAGNOSIS — E785 Hyperlipidemia, unspecified: Secondary | ICD-10-CM | POA: Diagnosis not present

## 2022-10-10 DIAGNOSIS — F0390 Unspecified dementia without behavioral disturbance: Secondary | ICD-10-CM | POA: Diagnosis not present

## 2022-10-10 DIAGNOSIS — F411 Generalized anxiety disorder: Secondary | ICD-10-CM | POA: Diagnosis not present

## 2022-10-15 DIAGNOSIS — E46 Unspecified protein-calorie malnutrition: Secondary | ICD-10-CM | POA: Diagnosis not present

## 2022-10-15 DIAGNOSIS — W19XXXD Unspecified fall, subsequent encounter: Secondary | ICD-10-CM | POA: Diagnosis not present

## 2022-10-15 DIAGNOSIS — F411 Generalized anxiety disorder: Secondary | ICD-10-CM | POA: Diagnosis not present

## 2022-10-15 DIAGNOSIS — S6291XD Unspecified fracture of right wrist and hand, subsequent encounter for fracture with routine healing: Secondary | ICD-10-CM | POA: Diagnosis not present

## 2022-10-15 DIAGNOSIS — F0390 Unspecified dementia without behavioral disturbance: Secondary | ICD-10-CM | POA: Diagnosis not present

## 2022-10-15 DIAGNOSIS — E785 Hyperlipidemia, unspecified: Secondary | ICD-10-CM | POA: Diagnosis not present

## 2022-10-16 DIAGNOSIS — E46 Unspecified protein-calorie malnutrition: Secondary | ICD-10-CM | POA: Diagnosis not present

## 2022-10-16 DIAGNOSIS — E785 Hyperlipidemia, unspecified: Secondary | ICD-10-CM | POA: Diagnosis not present

## 2022-10-16 DIAGNOSIS — S6291XD Unspecified fracture of right wrist and hand, subsequent encounter for fracture with routine healing: Secondary | ICD-10-CM | POA: Diagnosis not present

## 2022-10-16 DIAGNOSIS — F0390 Unspecified dementia without behavioral disturbance: Secondary | ICD-10-CM | POA: Diagnosis not present

## 2022-10-16 DIAGNOSIS — W19XXXD Unspecified fall, subsequent encounter: Secondary | ICD-10-CM | POA: Diagnosis not present

## 2022-10-16 DIAGNOSIS — F411 Generalized anxiety disorder: Secondary | ICD-10-CM | POA: Diagnosis not present

## 2022-10-17 DIAGNOSIS — E785 Hyperlipidemia, unspecified: Secondary | ICD-10-CM | POA: Diagnosis not present

## 2022-10-17 DIAGNOSIS — F0390 Unspecified dementia without behavioral disturbance: Secondary | ICD-10-CM | POA: Diagnosis not present

## 2022-10-17 DIAGNOSIS — F411 Generalized anxiety disorder: Secondary | ICD-10-CM | POA: Diagnosis not present

## 2022-10-17 DIAGNOSIS — E46 Unspecified protein-calorie malnutrition: Secondary | ICD-10-CM | POA: Diagnosis not present

## 2022-10-17 DIAGNOSIS — S6291XD Unspecified fracture of right wrist and hand, subsequent encounter for fracture with routine healing: Secondary | ICD-10-CM | POA: Diagnosis not present

## 2022-10-17 DIAGNOSIS — W19XXXD Unspecified fall, subsequent encounter: Secondary | ICD-10-CM | POA: Diagnosis not present

## 2022-10-22 DIAGNOSIS — S6291XD Unspecified fracture of right wrist and hand, subsequent encounter for fracture with routine healing: Secondary | ICD-10-CM | POA: Diagnosis not present

## 2022-10-22 DIAGNOSIS — F411 Generalized anxiety disorder: Secondary | ICD-10-CM | POA: Diagnosis not present

## 2022-10-22 DIAGNOSIS — E785 Hyperlipidemia, unspecified: Secondary | ICD-10-CM | POA: Diagnosis not present

## 2022-10-22 DIAGNOSIS — W19XXXD Unspecified fall, subsequent encounter: Secondary | ICD-10-CM | POA: Diagnosis not present

## 2022-10-22 DIAGNOSIS — E46 Unspecified protein-calorie malnutrition: Secondary | ICD-10-CM | POA: Diagnosis not present

## 2022-10-22 DIAGNOSIS — F0390 Unspecified dementia without behavioral disturbance: Secondary | ICD-10-CM | POA: Diagnosis not present

## 2022-10-23 DIAGNOSIS — F411 Generalized anxiety disorder: Secondary | ICD-10-CM | POA: Diagnosis not present

## 2022-10-23 DIAGNOSIS — F339 Major depressive disorder, recurrent, unspecified: Secondary | ICD-10-CM | POA: Diagnosis not present

## 2022-10-23 DIAGNOSIS — F0390 Unspecified dementia without behavioral disturbance: Secondary | ICD-10-CM | POA: Diagnosis not present

## 2022-10-24 DIAGNOSIS — F0390 Unspecified dementia without behavioral disturbance: Secondary | ICD-10-CM | POA: Diagnosis not present

## 2022-10-24 DIAGNOSIS — F411 Generalized anxiety disorder: Secondary | ICD-10-CM | POA: Diagnosis not present

## 2022-10-24 DIAGNOSIS — W19XXXD Unspecified fall, subsequent encounter: Secondary | ICD-10-CM | POA: Diagnosis not present

## 2022-10-24 DIAGNOSIS — E46 Unspecified protein-calorie malnutrition: Secondary | ICD-10-CM | POA: Diagnosis not present

## 2022-10-24 DIAGNOSIS — S6291XD Unspecified fracture of right wrist and hand, subsequent encounter for fracture with routine healing: Secondary | ICD-10-CM | POA: Diagnosis not present

## 2022-10-24 DIAGNOSIS — E785 Hyperlipidemia, unspecified: Secondary | ICD-10-CM | POA: Diagnosis not present

## 2022-10-29 DIAGNOSIS — W19XXXD Unspecified fall, subsequent encounter: Secondary | ICD-10-CM | POA: Diagnosis not present

## 2022-10-29 DIAGNOSIS — E785 Hyperlipidemia, unspecified: Secondary | ICD-10-CM | POA: Diagnosis not present

## 2022-10-29 DIAGNOSIS — F411 Generalized anxiety disorder: Secondary | ICD-10-CM | POA: Diagnosis not present

## 2022-10-29 DIAGNOSIS — E46 Unspecified protein-calorie malnutrition: Secondary | ICD-10-CM | POA: Diagnosis not present

## 2022-10-29 DIAGNOSIS — F0390 Unspecified dementia without behavioral disturbance: Secondary | ICD-10-CM | POA: Diagnosis not present

## 2022-10-29 DIAGNOSIS — S6291XD Unspecified fracture of right wrist and hand, subsequent encounter for fracture with routine healing: Secondary | ICD-10-CM | POA: Diagnosis not present

## 2022-10-31 DIAGNOSIS — W19XXXD Unspecified fall, subsequent encounter: Secondary | ICD-10-CM | POA: Diagnosis not present

## 2022-10-31 DIAGNOSIS — S6291XD Unspecified fracture of right wrist and hand, subsequent encounter for fracture with routine healing: Secondary | ICD-10-CM | POA: Diagnosis not present

## 2022-10-31 DIAGNOSIS — F0390 Unspecified dementia without behavioral disturbance: Secondary | ICD-10-CM | POA: Diagnosis not present

## 2022-10-31 DIAGNOSIS — E46 Unspecified protein-calorie malnutrition: Secondary | ICD-10-CM | POA: Diagnosis not present

## 2022-10-31 DIAGNOSIS — E785 Hyperlipidemia, unspecified: Secondary | ICD-10-CM | POA: Diagnosis not present

## 2022-10-31 DIAGNOSIS — F411 Generalized anxiety disorder: Secondary | ICD-10-CM | POA: Diagnosis not present

## 2022-11-02 DIAGNOSIS — I10 Essential (primary) hypertension: Secondary | ICD-10-CM | POA: Diagnosis not present

## 2022-11-02 DIAGNOSIS — W19XXXD Unspecified fall, subsequent encounter: Secondary | ICD-10-CM | POA: Diagnosis not present

## 2022-11-02 DIAGNOSIS — E785 Hyperlipidemia, unspecified: Secondary | ICD-10-CM | POA: Diagnosis not present

## 2022-11-02 DIAGNOSIS — R262 Difficulty in walking, not elsewhere classified: Secondary | ICD-10-CM | POA: Diagnosis not present

## 2022-11-02 DIAGNOSIS — F339 Major depressive disorder, recurrent, unspecified: Secondary | ICD-10-CM | POA: Diagnosis not present

## 2022-11-02 DIAGNOSIS — R531 Weakness: Secondary | ICD-10-CM | POA: Diagnosis not present

## 2022-11-02 DIAGNOSIS — H543 Unqualified visual loss, both eyes: Secondary | ICD-10-CM | POA: Diagnosis not present

## 2022-11-02 DIAGNOSIS — F0390 Unspecified dementia without behavioral disturbance: Secondary | ICD-10-CM | POA: Diagnosis not present

## 2022-11-02 DIAGNOSIS — S6291XD Unspecified fracture of right wrist and hand, subsequent encounter for fracture with routine healing: Secondary | ICD-10-CM | POA: Diagnosis not present

## 2022-11-02 DIAGNOSIS — F411 Generalized anxiety disorder: Secondary | ICD-10-CM | POA: Diagnosis not present

## 2022-11-02 DIAGNOSIS — M6281 Muscle weakness (generalized): Secondary | ICD-10-CM | POA: Diagnosis not present

## 2022-11-02 DIAGNOSIS — J439 Emphysema, unspecified: Secondary | ICD-10-CM | POA: Diagnosis not present

## 2022-11-02 DIAGNOSIS — E46 Unspecified protein-calorie malnutrition: Secondary | ICD-10-CM | POA: Diagnosis not present

## 2022-11-05 DIAGNOSIS — S6291XD Unspecified fracture of right wrist and hand, subsequent encounter for fracture with routine healing: Secondary | ICD-10-CM | POA: Diagnosis not present

## 2022-11-05 DIAGNOSIS — F411 Generalized anxiety disorder: Secondary | ICD-10-CM | POA: Diagnosis not present

## 2022-11-05 DIAGNOSIS — I739 Peripheral vascular disease, unspecified: Secondary | ICD-10-CM | POA: Diagnosis not present

## 2022-11-05 DIAGNOSIS — F419 Anxiety disorder, unspecified: Secondary | ICD-10-CM | POA: Diagnosis not present

## 2022-11-05 DIAGNOSIS — E785 Hyperlipidemia, unspecified: Secondary | ICD-10-CM | POA: Diagnosis not present

## 2022-11-05 DIAGNOSIS — E46 Unspecified protein-calorie malnutrition: Secondary | ICD-10-CM | POA: Diagnosis not present

## 2022-11-05 DIAGNOSIS — W19XXXD Unspecified fall, subsequent encounter: Secondary | ICD-10-CM | POA: Diagnosis not present

## 2022-11-05 DIAGNOSIS — F0390 Unspecified dementia without behavioral disturbance: Secondary | ICD-10-CM | POA: Diagnosis not present

## 2022-11-05 DIAGNOSIS — I10 Essential (primary) hypertension: Secondary | ICD-10-CM | POA: Diagnosis not present

## 2022-11-06 DIAGNOSIS — B351 Tinea unguium: Secondary | ICD-10-CM | POA: Diagnosis not present

## 2022-11-06 DIAGNOSIS — I7091 Generalized atherosclerosis: Secondary | ICD-10-CM | POA: Diagnosis not present

## 2022-11-07 DIAGNOSIS — F0390 Unspecified dementia without behavioral disturbance: Secondary | ICD-10-CM | POA: Diagnosis not present

## 2022-11-07 DIAGNOSIS — W19XXXD Unspecified fall, subsequent encounter: Secondary | ICD-10-CM | POA: Diagnosis not present

## 2022-11-07 DIAGNOSIS — E785 Hyperlipidemia, unspecified: Secondary | ICD-10-CM | POA: Diagnosis not present

## 2022-11-07 DIAGNOSIS — E46 Unspecified protein-calorie malnutrition: Secondary | ICD-10-CM | POA: Diagnosis not present

## 2022-11-07 DIAGNOSIS — S6291XD Unspecified fracture of right wrist and hand, subsequent encounter for fracture with routine healing: Secondary | ICD-10-CM | POA: Diagnosis not present

## 2022-11-07 DIAGNOSIS — F411 Generalized anxiety disorder: Secondary | ICD-10-CM | POA: Diagnosis not present

## 2022-11-11 DIAGNOSIS — S6291XD Unspecified fracture of right wrist and hand, subsequent encounter for fracture with routine healing: Secondary | ICD-10-CM | POA: Diagnosis not present

## 2022-11-11 DIAGNOSIS — E785 Hyperlipidemia, unspecified: Secondary | ICD-10-CM | POA: Diagnosis not present

## 2022-11-11 DIAGNOSIS — E46 Unspecified protein-calorie malnutrition: Secondary | ICD-10-CM | POA: Diagnosis not present

## 2022-11-11 DIAGNOSIS — W19XXXD Unspecified fall, subsequent encounter: Secondary | ICD-10-CM | POA: Diagnosis not present

## 2022-11-11 DIAGNOSIS — F0390 Unspecified dementia without behavioral disturbance: Secondary | ICD-10-CM | POA: Diagnosis not present

## 2022-11-11 DIAGNOSIS — F411 Generalized anxiety disorder: Secondary | ICD-10-CM | POA: Diagnosis not present

## 2022-11-12 DIAGNOSIS — E785 Hyperlipidemia, unspecified: Secondary | ICD-10-CM | POA: Diagnosis not present

## 2022-11-12 DIAGNOSIS — S6291XD Unspecified fracture of right wrist and hand, subsequent encounter for fracture with routine healing: Secondary | ICD-10-CM | POA: Diagnosis not present

## 2022-11-12 DIAGNOSIS — W19XXXD Unspecified fall, subsequent encounter: Secondary | ICD-10-CM | POA: Diagnosis not present

## 2022-11-12 DIAGNOSIS — F411 Generalized anxiety disorder: Secondary | ICD-10-CM | POA: Diagnosis not present

## 2022-11-12 DIAGNOSIS — F339 Major depressive disorder, recurrent, unspecified: Secondary | ICD-10-CM | POA: Diagnosis not present

## 2022-11-12 DIAGNOSIS — E46 Unspecified protein-calorie malnutrition: Secondary | ICD-10-CM | POA: Diagnosis not present

## 2022-11-12 DIAGNOSIS — F0390 Unspecified dementia without behavioral disturbance: Secondary | ICD-10-CM | POA: Diagnosis not present

## 2022-11-14 DIAGNOSIS — E785 Hyperlipidemia, unspecified: Secondary | ICD-10-CM | POA: Diagnosis not present

## 2022-11-14 DIAGNOSIS — F0390 Unspecified dementia without behavioral disturbance: Secondary | ICD-10-CM | POA: Diagnosis not present

## 2022-11-14 DIAGNOSIS — F411 Generalized anxiety disorder: Secondary | ICD-10-CM | POA: Diagnosis not present

## 2022-11-14 DIAGNOSIS — E46 Unspecified protein-calorie malnutrition: Secondary | ICD-10-CM | POA: Diagnosis not present

## 2022-11-14 DIAGNOSIS — W19XXXD Unspecified fall, subsequent encounter: Secondary | ICD-10-CM | POA: Diagnosis not present

## 2022-11-14 DIAGNOSIS — S6291XD Unspecified fracture of right wrist and hand, subsequent encounter for fracture with routine healing: Secondary | ICD-10-CM | POA: Diagnosis not present

## 2022-11-19 DIAGNOSIS — W19XXXD Unspecified fall, subsequent encounter: Secondary | ICD-10-CM | POA: Diagnosis not present

## 2022-11-19 DIAGNOSIS — E46 Unspecified protein-calorie malnutrition: Secondary | ICD-10-CM | POA: Diagnosis not present

## 2022-11-19 DIAGNOSIS — S6291XD Unspecified fracture of right wrist and hand, subsequent encounter for fracture with routine healing: Secondary | ICD-10-CM | POA: Diagnosis not present

## 2022-11-19 DIAGNOSIS — F0390 Unspecified dementia without behavioral disturbance: Secondary | ICD-10-CM | POA: Diagnosis not present

## 2022-11-19 DIAGNOSIS — F411 Generalized anxiety disorder: Secondary | ICD-10-CM | POA: Diagnosis not present

## 2022-11-19 DIAGNOSIS — E785 Hyperlipidemia, unspecified: Secondary | ICD-10-CM | POA: Diagnosis not present

## 2022-11-21 DIAGNOSIS — F0390 Unspecified dementia without behavioral disturbance: Secondary | ICD-10-CM | POA: Diagnosis not present

## 2022-11-21 DIAGNOSIS — F411 Generalized anxiety disorder: Secondary | ICD-10-CM | POA: Diagnosis not present

## 2022-11-21 DIAGNOSIS — W19XXXD Unspecified fall, subsequent encounter: Secondary | ICD-10-CM | POA: Diagnosis not present

## 2022-11-21 DIAGNOSIS — E785 Hyperlipidemia, unspecified: Secondary | ICD-10-CM | POA: Diagnosis not present

## 2022-11-21 DIAGNOSIS — E46 Unspecified protein-calorie malnutrition: Secondary | ICD-10-CM | POA: Diagnosis not present

## 2022-11-21 DIAGNOSIS — S6291XD Unspecified fracture of right wrist and hand, subsequent encounter for fracture with routine healing: Secondary | ICD-10-CM | POA: Diagnosis not present

## 2022-11-26 DIAGNOSIS — E46 Unspecified protein-calorie malnutrition: Secondary | ICD-10-CM | POA: Diagnosis not present

## 2022-11-26 DIAGNOSIS — F411 Generalized anxiety disorder: Secondary | ICD-10-CM | POA: Diagnosis not present

## 2022-11-26 DIAGNOSIS — E785 Hyperlipidemia, unspecified: Secondary | ICD-10-CM | POA: Diagnosis not present

## 2022-11-26 DIAGNOSIS — F0390 Unspecified dementia without behavioral disturbance: Secondary | ICD-10-CM | POA: Diagnosis not present

## 2022-11-26 DIAGNOSIS — W19XXXD Unspecified fall, subsequent encounter: Secondary | ICD-10-CM | POA: Diagnosis not present

## 2022-11-26 DIAGNOSIS — S6291XD Unspecified fracture of right wrist and hand, subsequent encounter for fracture with routine healing: Secondary | ICD-10-CM | POA: Diagnosis not present

## 2022-11-28 DIAGNOSIS — W19XXXD Unspecified fall, subsequent encounter: Secondary | ICD-10-CM | POA: Diagnosis not present

## 2022-11-28 DIAGNOSIS — E785 Hyperlipidemia, unspecified: Secondary | ICD-10-CM | POA: Diagnosis not present

## 2022-11-28 DIAGNOSIS — S6291XD Unspecified fracture of right wrist and hand, subsequent encounter for fracture with routine healing: Secondary | ICD-10-CM | POA: Diagnosis not present

## 2022-11-28 DIAGNOSIS — E46 Unspecified protein-calorie malnutrition: Secondary | ICD-10-CM | POA: Diagnosis not present

## 2022-11-28 DIAGNOSIS — F411 Generalized anxiety disorder: Secondary | ICD-10-CM | POA: Diagnosis not present

## 2022-11-28 DIAGNOSIS — F0390 Unspecified dementia without behavioral disturbance: Secondary | ICD-10-CM | POA: Diagnosis not present

## 2022-12-02 DIAGNOSIS — W19XXXD Unspecified fall, subsequent encounter: Secondary | ICD-10-CM | POA: Diagnosis not present

## 2022-12-02 DIAGNOSIS — E46 Unspecified protein-calorie malnutrition: Secondary | ICD-10-CM | POA: Diagnosis not present

## 2022-12-02 DIAGNOSIS — M6281 Muscle weakness (generalized): Secondary | ICD-10-CM | POA: Diagnosis not present

## 2022-12-02 DIAGNOSIS — H543 Unqualified visual loss, both eyes: Secondary | ICD-10-CM | POA: Diagnosis not present

## 2022-12-02 DIAGNOSIS — E785 Hyperlipidemia, unspecified: Secondary | ICD-10-CM | POA: Diagnosis not present

## 2022-12-02 DIAGNOSIS — I10 Essential (primary) hypertension: Secondary | ICD-10-CM | POA: Diagnosis not present

## 2022-12-02 DIAGNOSIS — R531 Weakness: Secondary | ICD-10-CM | POA: Diagnosis not present

## 2022-12-02 DIAGNOSIS — F0390 Unspecified dementia without behavioral disturbance: Secondary | ICD-10-CM | POA: Diagnosis not present

## 2022-12-02 DIAGNOSIS — F339 Major depressive disorder, recurrent, unspecified: Secondary | ICD-10-CM | POA: Diagnosis not present

## 2022-12-02 DIAGNOSIS — F411 Generalized anxiety disorder: Secondary | ICD-10-CM | POA: Diagnosis not present

## 2022-12-02 DIAGNOSIS — R262 Difficulty in walking, not elsewhere classified: Secondary | ICD-10-CM | POA: Diagnosis not present

## 2022-12-02 DIAGNOSIS — S6291XD Unspecified fracture of right wrist and hand, subsequent encounter for fracture with routine healing: Secondary | ICD-10-CM | POA: Diagnosis not present

## 2022-12-02 DIAGNOSIS — J439 Emphysema, unspecified: Secondary | ICD-10-CM | POA: Diagnosis not present

## 2022-12-03 DIAGNOSIS — W19XXXD Unspecified fall, subsequent encounter: Secondary | ICD-10-CM | POA: Diagnosis not present

## 2022-12-03 DIAGNOSIS — S6291XD Unspecified fracture of right wrist and hand, subsequent encounter for fracture with routine healing: Secondary | ICD-10-CM | POA: Diagnosis not present

## 2022-12-03 DIAGNOSIS — E46 Unspecified protein-calorie malnutrition: Secondary | ICD-10-CM | POA: Diagnosis not present

## 2022-12-03 DIAGNOSIS — F411 Generalized anxiety disorder: Secondary | ICD-10-CM | POA: Diagnosis not present

## 2022-12-03 DIAGNOSIS — F0390 Unspecified dementia without behavioral disturbance: Secondary | ICD-10-CM | POA: Diagnosis not present

## 2022-12-03 DIAGNOSIS — E785 Hyperlipidemia, unspecified: Secondary | ICD-10-CM | POA: Diagnosis not present

## 2022-12-04 DIAGNOSIS — F0390 Unspecified dementia without behavioral disturbance: Secondary | ICD-10-CM | POA: Diagnosis not present

## 2022-12-04 DIAGNOSIS — F411 Generalized anxiety disorder: Secondary | ICD-10-CM | POA: Diagnosis not present

## 2022-12-04 DIAGNOSIS — F339 Major depressive disorder, recurrent, unspecified: Secondary | ICD-10-CM | POA: Diagnosis not present

## 2022-12-06 DIAGNOSIS — E785 Hyperlipidemia, unspecified: Secondary | ICD-10-CM | POA: Diagnosis not present

## 2022-12-06 DIAGNOSIS — S6291XD Unspecified fracture of right wrist and hand, subsequent encounter for fracture with routine healing: Secondary | ICD-10-CM | POA: Diagnosis not present

## 2022-12-06 DIAGNOSIS — E46 Unspecified protein-calorie malnutrition: Secondary | ICD-10-CM | POA: Diagnosis not present

## 2022-12-06 DIAGNOSIS — W19XXXD Unspecified fall, subsequent encounter: Secondary | ICD-10-CM | POA: Diagnosis not present

## 2022-12-06 DIAGNOSIS — F411 Generalized anxiety disorder: Secondary | ICD-10-CM | POA: Diagnosis not present

## 2022-12-06 DIAGNOSIS — F0390 Unspecified dementia without behavioral disturbance: Secondary | ICD-10-CM | POA: Diagnosis not present

## 2022-12-10 DIAGNOSIS — W19XXXD Unspecified fall, subsequent encounter: Secondary | ICD-10-CM | POA: Diagnosis not present

## 2022-12-10 DIAGNOSIS — E785 Hyperlipidemia, unspecified: Secondary | ICD-10-CM | POA: Diagnosis not present

## 2022-12-10 DIAGNOSIS — E46 Unspecified protein-calorie malnutrition: Secondary | ICD-10-CM | POA: Diagnosis not present

## 2022-12-10 DIAGNOSIS — F411 Generalized anxiety disorder: Secondary | ICD-10-CM | POA: Diagnosis not present

## 2022-12-10 DIAGNOSIS — F0390 Unspecified dementia without behavioral disturbance: Secondary | ICD-10-CM | POA: Diagnosis not present

## 2022-12-10 DIAGNOSIS — S6291XD Unspecified fracture of right wrist and hand, subsequent encounter for fracture with routine healing: Secondary | ICD-10-CM | POA: Diagnosis not present

## 2022-12-12 DIAGNOSIS — F0390 Unspecified dementia without behavioral disturbance: Secondary | ICD-10-CM | POA: Diagnosis not present

## 2022-12-12 DIAGNOSIS — W19XXXD Unspecified fall, subsequent encounter: Secondary | ICD-10-CM | POA: Diagnosis not present

## 2022-12-12 DIAGNOSIS — S6291XD Unspecified fracture of right wrist and hand, subsequent encounter for fracture with routine healing: Secondary | ICD-10-CM | POA: Diagnosis not present

## 2022-12-12 DIAGNOSIS — F411 Generalized anxiety disorder: Secondary | ICD-10-CM | POA: Diagnosis not present

## 2022-12-12 DIAGNOSIS — E46 Unspecified protein-calorie malnutrition: Secondary | ICD-10-CM | POA: Diagnosis not present

## 2022-12-12 DIAGNOSIS — E785 Hyperlipidemia, unspecified: Secondary | ICD-10-CM | POA: Diagnosis not present

## 2022-12-17 DIAGNOSIS — F0390 Unspecified dementia without behavioral disturbance: Secondary | ICD-10-CM | POA: Diagnosis not present

## 2022-12-17 DIAGNOSIS — E46 Unspecified protein-calorie malnutrition: Secondary | ICD-10-CM | POA: Diagnosis not present

## 2022-12-17 DIAGNOSIS — S6291XD Unspecified fracture of right wrist and hand, subsequent encounter for fracture with routine healing: Secondary | ICD-10-CM | POA: Diagnosis not present

## 2022-12-17 DIAGNOSIS — F411 Generalized anxiety disorder: Secondary | ICD-10-CM | POA: Diagnosis not present

## 2022-12-17 DIAGNOSIS — E785 Hyperlipidemia, unspecified: Secondary | ICD-10-CM | POA: Diagnosis not present

## 2022-12-17 DIAGNOSIS — W19XXXD Unspecified fall, subsequent encounter: Secondary | ICD-10-CM | POA: Diagnosis not present

## 2022-12-19 DIAGNOSIS — F411 Generalized anxiety disorder: Secondary | ICD-10-CM | POA: Diagnosis not present

## 2022-12-19 DIAGNOSIS — F0390 Unspecified dementia without behavioral disturbance: Secondary | ICD-10-CM | POA: Diagnosis not present

## 2022-12-19 DIAGNOSIS — E785 Hyperlipidemia, unspecified: Secondary | ICD-10-CM | POA: Diagnosis not present

## 2022-12-19 DIAGNOSIS — S6291XD Unspecified fracture of right wrist and hand, subsequent encounter for fracture with routine healing: Secondary | ICD-10-CM | POA: Diagnosis not present

## 2022-12-19 DIAGNOSIS — E46 Unspecified protein-calorie malnutrition: Secondary | ICD-10-CM | POA: Diagnosis not present

## 2022-12-19 DIAGNOSIS — W19XXXD Unspecified fall, subsequent encounter: Secondary | ICD-10-CM | POA: Diagnosis not present

## 2022-12-20 DIAGNOSIS — W19XXXD Unspecified fall, subsequent encounter: Secondary | ICD-10-CM | POA: Diagnosis not present

## 2022-12-20 DIAGNOSIS — F411 Generalized anxiety disorder: Secondary | ICD-10-CM | POA: Diagnosis not present

## 2022-12-20 DIAGNOSIS — F0390 Unspecified dementia without behavioral disturbance: Secondary | ICD-10-CM | POA: Diagnosis not present

## 2022-12-20 DIAGNOSIS — S6291XD Unspecified fracture of right wrist and hand, subsequent encounter for fracture with routine healing: Secondary | ICD-10-CM | POA: Diagnosis not present

## 2022-12-20 DIAGNOSIS — E46 Unspecified protein-calorie malnutrition: Secondary | ICD-10-CM | POA: Diagnosis not present

## 2022-12-20 DIAGNOSIS — E785 Hyperlipidemia, unspecified: Secondary | ICD-10-CM | POA: Diagnosis not present

## 2022-12-24 DIAGNOSIS — F0390 Unspecified dementia without behavioral disturbance: Secondary | ICD-10-CM | POA: Diagnosis not present

## 2022-12-24 DIAGNOSIS — E785 Hyperlipidemia, unspecified: Secondary | ICD-10-CM | POA: Diagnosis not present

## 2022-12-24 DIAGNOSIS — F411 Generalized anxiety disorder: Secondary | ICD-10-CM | POA: Diagnosis not present

## 2022-12-24 DIAGNOSIS — S6291XD Unspecified fracture of right wrist and hand, subsequent encounter for fracture with routine healing: Secondary | ICD-10-CM | POA: Diagnosis not present

## 2022-12-24 DIAGNOSIS — W19XXXD Unspecified fall, subsequent encounter: Secondary | ICD-10-CM | POA: Diagnosis not present

## 2022-12-24 DIAGNOSIS — E46 Unspecified protein-calorie malnutrition: Secondary | ICD-10-CM | POA: Diagnosis not present

## 2022-12-26 DIAGNOSIS — E46 Unspecified protein-calorie malnutrition: Secondary | ICD-10-CM | POA: Diagnosis not present

## 2022-12-26 DIAGNOSIS — S6291XD Unspecified fracture of right wrist and hand, subsequent encounter for fracture with routine healing: Secondary | ICD-10-CM | POA: Diagnosis not present

## 2022-12-26 DIAGNOSIS — W19XXXD Unspecified fall, subsequent encounter: Secondary | ICD-10-CM | POA: Diagnosis not present

## 2022-12-26 DIAGNOSIS — F0390 Unspecified dementia without behavioral disturbance: Secondary | ICD-10-CM | POA: Diagnosis not present

## 2022-12-26 DIAGNOSIS — F411 Generalized anxiety disorder: Secondary | ICD-10-CM | POA: Diagnosis not present

## 2022-12-26 DIAGNOSIS — E785 Hyperlipidemia, unspecified: Secondary | ICD-10-CM | POA: Diagnosis not present

## 2022-12-31 DIAGNOSIS — F411 Generalized anxiety disorder: Secondary | ICD-10-CM | POA: Diagnosis not present

## 2022-12-31 DIAGNOSIS — F339 Major depressive disorder, recurrent, unspecified: Secondary | ICD-10-CM | POA: Diagnosis not present

## 2022-12-31 DIAGNOSIS — W19XXXD Unspecified fall, subsequent encounter: Secondary | ICD-10-CM | POA: Diagnosis not present

## 2022-12-31 DIAGNOSIS — F0390 Unspecified dementia without behavioral disturbance: Secondary | ICD-10-CM | POA: Diagnosis not present

## 2022-12-31 DIAGNOSIS — E785 Hyperlipidemia, unspecified: Secondary | ICD-10-CM | POA: Diagnosis not present

## 2022-12-31 DIAGNOSIS — E46 Unspecified protein-calorie malnutrition: Secondary | ICD-10-CM | POA: Diagnosis not present

## 2022-12-31 DIAGNOSIS — S6291XD Unspecified fracture of right wrist and hand, subsequent encounter for fracture with routine healing: Secondary | ICD-10-CM | POA: Diagnosis not present

## 2023-01-01 DIAGNOSIS — F339 Major depressive disorder, recurrent, unspecified: Secondary | ICD-10-CM | POA: Diagnosis not present

## 2023-01-01 DIAGNOSIS — F411 Generalized anxiety disorder: Secondary | ICD-10-CM | POA: Diagnosis not present

## 2023-01-01 DIAGNOSIS — G4700 Insomnia, unspecified: Secondary | ICD-10-CM | POA: Diagnosis not present

## 2023-01-01 DIAGNOSIS — F0390 Unspecified dementia without behavioral disturbance: Secondary | ICD-10-CM | POA: Diagnosis not present

## 2023-01-02 DIAGNOSIS — M6281 Muscle weakness (generalized): Secondary | ICD-10-CM | POA: Diagnosis not present

## 2023-01-02 DIAGNOSIS — F411 Generalized anxiety disorder: Secondary | ICD-10-CM | POA: Diagnosis not present

## 2023-01-02 DIAGNOSIS — J439 Emphysema, unspecified: Secondary | ICD-10-CM | POA: Diagnosis not present

## 2023-01-02 DIAGNOSIS — R262 Difficulty in walking, not elsewhere classified: Secondary | ICD-10-CM | POA: Diagnosis not present

## 2023-01-02 DIAGNOSIS — R531 Weakness: Secondary | ICD-10-CM | POA: Diagnosis not present

## 2023-01-02 DIAGNOSIS — H543 Unqualified visual loss, both eyes: Secondary | ICD-10-CM | POA: Diagnosis not present

## 2023-01-02 DIAGNOSIS — F0390 Unspecified dementia without behavioral disturbance: Secondary | ICD-10-CM | POA: Diagnosis not present

## 2023-01-02 DIAGNOSIS — F339 Major depressive disorder, recurrent, unspecified: Secondary | ICD-10-CM | POA: Diagnosis not present

## 2023-01-02 DIAGNOSIS — W19XXXD Unspecified fall, subsequent encounter: Secondary | ICD-10-CM | POA: Diagnosis not present

## 2023-01-02 DIAGNOSIS — I10 Essential (primary) hypertension: Secondary | ICD-10-CM | POA: Diagnosis not present

## 2023-01-02 DIAGNOSIS — E785 Hyperlipidemia, unspecified: Secondary | ICD-10-CM | POA: Diagnosis not present

## 2023-01-02 DIAGNOSIS — E46 Unspecified protein-calorie malnutrition: Secondary | ICD-10-CM | POA: Diagnosis not present

## 2023-01-02 DIAGNOSIS — S6291XD Unspecified fracture of right wrist and hand, subsequent encounter for fracture with routine healing: Secondary | ICD-10-CM | POA: Diagnosis not present

## 2023-01-03 DIAGNOSIS — S6291XD Unspecified fracture of right wrist and hand, subsequent encounter for fracture with routine healing: Secondary | ICD-10-CM | POA: Diagnosis not present

## 2023-01-03 DIAGNOSIS — W19XXXD Unspecified fall, subsequent encounter: Secondary | ICD-10-CM | POA: Diagnosis not present

## 2023-01-03 DIAGNOSIS — F411 Generalized anxiety disorder: Secondary | ICD-10-CM | POA: Diagnosis not present

## 2023-01-03 DIAGNOSIS — E785 Hyperlipidemia, unspecified: Secondary | ICD-10-CM | POA: Diagnosis not present

## 2023-01-03 DIAGNOSIS — F0390 Unspecified dementia without behavioral disturbance: Secondary | ICD-10-CM | POA: Diagnosis not present

## 2023-01-03 DIAGNOSIS — E46 Unspecified protein-calorie malnutrition: Secondary | ICD-10-CM | POA: Diagnosis not present

## 2023-01-07 DIAGNOSIS — E785 Hyperlipidemia, unspecified: Secondary | ICD-10-CM | POA: Diagnosis not present

## 2023-01-07 DIAGNOSIS — W19XXXD Unspecified fall, subsequent encounter: Secondary | ICD-10-CM | POA: Diagnosis not present

## 2023-01-07 DIAGNOSIS — F0390 Unspecified dementia without behavioral disturbance: Secondary | ICD-10-CM | POA: Diagnosis not present

## 2023-01-07 DIAGNOSIS — F411 Generalized anxiety disorder: Secondary | ICD-10-CM | POA: Diagnosis not present

## 2023-01-07 DIAGNOSIS — E46 Unspecified protein-calorie malnutrition: Secondary | ICD-10-CM | POA: Diagnosis not present

## 2023-01-07 DIAGNOSIS — S6291XD Unspecified fracture of right wrist and hand, subsequent encounter for fracture with routine healing: Secondary | ICD-10-CM | POA: Diagnosis not present

## 2023-01-08 DIAGNOSIS — I7091 Generalized atherosclerosis: Secondary | ICD-10-CM | POA: Diagnosis not present

## 2023-01-08 DIAGNOSIS — B351 Tinea unguium: Secondary | ICD-10-CM | POA: Diagnosis not present

## 2023-01-09 DIAGNOSIS — F0390 Unspecified dementia without behavioral disturbance: Secondary | ICD-10-CM | POA: Diagnosis not present

## 2023-01-09 DIAGNOSIS — F411 Generalized anxiety disorder: Secondary | ICD-10-CM | POA: Diagnosis not present

## 2023-01-09 DIAGNOSIS — E46 Unspecified protein-calorie malnutrition: Secondary | ICD-10-CM | POA: Diagnosis not present

## 2023-01-09 DIAGNOSIS — S6291XD Unspecified fracture of right wrist and hand, subsequent encounter for fracture with routine healing: Secondary | ICD-10-CM | POA: Diagnosis not present

## 2023-01-09 DIAGNOSIS — E785 Hyperlipidemia, unspecified: Secondary | ICD-10-CM | POA: Diagnosis not present

## 2023-01-09 DIAGNOSIS — W19XXXD Unspecified fall, subsequent encounter: Secondary | ICD-10-CM | POA: Diagnosis not present

## 2023-01-13 DIAGNOSIS — F411 Generalized anxiety disorder: Secondary | ICD-10-CM | POA: Diagnosis not present

## 2023-01-13 DIAGNOSIS — R52 Pain, unspecified: Secondary | ICD-10-CM | POA: Diagnosis not present

## 2023-01-13 DIAGNOSIS — E46 Unspecified protein-calorie malnutrition: Secondary | ICD-10-CM | POA: Diagnosis not present

## 2023-01-13 DIAGNOSIS — F339 Major depressive disorder, recurrent, unspecified: Secondary | ICD-10-CM | POA: Diagnosis not present

## 2023-01-13 DIAGNOSIS — W19XXXD Unspecified fall, subsequent encounter: Secondary | ICD-10-CM | POA: Diagnosis not present

## 2023-01-13 DIAGNOSIS — I10 Essential (primary) hypertension: Secondary | ICD-10-CM | POA: Diagnosis not present

## 2023-01-13 DIAGNOSIS — F0390 Unspecified dementia without behavioral disturbance: Secondary | ICD-10-CM | POA: Diagnosis not present

## 2023-01-13 DIAGNOSIS — S6291XD Unspecified fracture of right wrist and hand, subsequent encounter for fracture with routine healing: Secondary | ICD-10-CM | POA: Diagnosis not present

## 2023-01-13 DIAGNOSIS — E785 Hyperlipidemia, unspecified: Secondary | ICD-10-CM | POA: Diagnosis not present

## 2023-01-14 DIAGNOSIS — F0390 Unspecified dementia without behavioral disturbance: Secondary | ICD-10-CM | POA: Diagnosis not present

## 2023-01-14 DIAGNOSIS — W19XXXD Unspecified fall, subsequent encounter: Secondary | ICD-10-CM | POA: Diagnosis not present

## 2023-01-14 DIAGNOSIS — S6291XD Unspecified fracture of right wrist and hand, subsequent encounter for fracture with routine healing: Secondary | ICD-10-CM | POA: Diagnosis not present

## 2023-01-14 DIAGNOSIS — E46 Unspecified protein-calorie malnutrition: Secondary | ICD-10-CM | POA: Diagnosis not present

## 2023-01-14 DIAGNOSIS — E785 Hyperlipidemia, unspecified: Secondary | ICD-10-CM | POA: Diagnosis not present

## 2023-01-14 DIAGNOSIS — F411 Generalized anxiety disorder: Secondary | ICD-10-CM | POA: Diagnosis not present

## 2023-01-15 DIAGNOSIS — F339 Major depressive disorder, recurrent, unspecified: Secondary | ICD-10-CM | POA: Diagnosis not present

## 2023-01-15 DIAGNOSIS — F0390 Unspecified dementia without behavioral disturbance: Secondary | ICD-10-CM | POA: Diagnosis not present

## 2023-01-15 DIAGNOSIS — G4700 Insomnia, unspecified: Secondary | ICD-10-CM | POA: Diagnosis not present

## 2023-01-15 DIAGNOSIS — F411 Generalized anxiety disorder: Secondary | ICD-10-CM | POA: Diagnosis not present

## 2023-01-16 DIAGNOSIS — S6291XD Unspecified fracture of right wrist and hand, subsequent encounter for fracture with routine healing: Secondary | ICD-10-CM | POA: Diagnosis not present

## 2023-01-16 DIAGNOSIS — E785 Hyperlipidemia, unspecified: Secondary | ICD-10-CM | POA: Diagnosis not present

## 2023-01-16 DIAGNOSIS — F411 Generalized anxiety disorder: Secondary | ICD-10-CM | POA: Diagnosis not present

## 2023-01-16 DIAGNOSIS — W19XXXD Unspecified fall, subsequent encounter: Secondary | ICD-10-CM | POA: Diagnosis not present

## 2023-01-16 DIAGNOSIS — E46 Unspecified protein-calorie malnutrition: Secondary | ICD-10-CM | POA: Diagnosis not present

## 2023-01-16 DIAGNOSIS — F0390 Unspecified dementia without behavioral disturbance: Secondary | ICD-10-CM | POA: Diagnosis not present

## 2023-01-21 DIAGNOSIS — F411 Generalized anxiety disorder: Secondary | ICD-10-CM | POA: Diagnosis not present

## 2023-01-21 DIAGNOSIS — F0390 Unspecified dementia without behavioral disturbance: Secondary | ICD-10-CM | POA: Diagnosis not present

## 2023-01-21 DIAGNOSIS — E46 Unspecified protein-calorie malnutrition: Secondary | ICD-10-CM | POA: Diagnosis not present

## 2023-01-21 DIAGNOSIS — S6291XD Unspecified fracture of right wrist and hand, subsequent encounter for fracture with routine healing: Secondary | ICD-10-CM | POA: Diagnosis not present

## 2023-01-21 DIAGNOSIS — E785 Hyperlipidemia, unspecified: Secondary | ICD-10-CM | POA: Diagnosis not present

## 2023-01-21 DIAGNOSIS — W19XXXD Unspecified fall, subsequent encounter: Secondary | ICD-10-CM | POA: Diagnosis not present

## 2023-01-22 DIAGNOSIS — F0390 Unspecified dementia without behavioral disturbance: Secondary | ICD-10-CM | POA: Diagnosis not present

## 2023-01-22 DIAGNOSIS — F411 Generalized anxiety disorder: Secondary | ICD-10-CM | POA: Diagnosis not present

## 2023-01-22 DIAGNOSIS — F339 Major depressive disorder, recurrent, unspecified: Secondary | ICD-10-CM | POA: Diagnosis not present

## 2023-01-22 DIAGNOSIS — G4700 Insomnia, unspecified: Secondary | ICD-10-CM | POA: Diagnosis not present

## 2023-01-23 DIAGNOSIS — F0390 Unspecified dementia without behavioral disturbance: Secondary | ICD-10-CM | POA: Diagnosis not present

## 2023-01-23 DIAGNOSIS — S6291XD Unspecified fracture of right wrist and hand, subsequent encounter for fracture with routine healing: Secondary | ICD-10-CM | POA: Diagnosis not present

## 2023-01-23 DIAGNOSIS — F411 Generalized anxiety disorder: Secondary | ICD-10-CM | POA: Diagnosis not present

## 2023-01-23 DIAGNOSIS — W19XXXD Unspecified fall, subsequent encounter: Secondary | ICD-10-CM | POA: Diagnosis not present

## 2023-01-23 DIAGNOSIS — E785 Hyperlipidemia, unspecified: Secondary | ICD-10-CM | POA: Diagnosis not present

## 2023-01-23 DIAGNOSIS — E46 Unspecified protein-calorie malnutrition: Secondary | ICD-10-CM | POA: Diagnosis not present

## 2023-01-28 DIAGNOSIS — F411 Generalized anxiety disorder: Secondary | ICD-10-CM | POA: Diagnosis not present

## 2023-01-28 DIAGNOSIS — F339 Major depressive disorder, recurrent, unspecified: Secondary | ICD-10-CM | POA: Diagnosis not present

## 2023-01-29 DIAGNOSIS — G4700 Insomnia, unspecified: Secondary | ICD-10-CM | POA: Diagnosis not present

## 2023-01-29 DIAGNOSIS — F339 Major depressive disorder, recurrent, unspecified: Secondary | ICD-10-CM | POA: Diagnosis not present

## 2023-01-29 DIAGNOSIS — F0390 Unspecified dementia without behavioral disturbance: Secondary | ICD-10-CM | POA: Diagnosis not present

## 2023-01-29 DIAGNOSIS — F411 Generalized anxiety disorder: Secondary | ICD-10-CM | POA: Diagnosis not present

## 2023-01-30 DIAGNOSIS — W19XXXD Unspecified fall, subsequent encounter: Secondary | ICD-10-CM | POA: Diagnosis not present

## 2023-01-30 DIAGNOSIS — F0390 Unspecified dementia without behavioral disturbance: Secondary | ICD-10-CM | POA: Diagnosis not present

## 2023-01-30 DIAGNOSIS — S6291XD Unspecified fracture of right wrist and hand, subsequent encounter for fracture with routine healing: Secondary | ICD-10-CM | POA: Diagnosis not present

## 2023-01-30 DIAGNOSIS — E785 Hyperlipidemia, unspecified: Secondary | ICD-10-CM | POA: Diagnosis not present

## 2023-01-30 DIAGNOSIS — E46 Unspecified protein-calorie malnutrition: Secondary | ICD-10-CM | POA: Diagnosis not present

## 2023-01-30 DIAGNOSIS — F411 Generalized anxiety disorder: Secondary | ICD-10-CM | POA: Diagnosis not present

## 2023-02-02 DIAGNOSIS — H543 Unqualified visual loss, both eyes: Secondary | ICD-10-CM | POA: Diagnosis not present

## 2023-02-02 DIAGNOSIS — W19XXXD Unspecified fall, subsequent encounter: Secondary | ICD-10-CM | POA: Diagnosis not present

## 2023-02-02 DIAGNOSIS — S6291XD Unspecified fracture of right wrist and hand, subsequent encounter for fracture with routine healing: Secondary | ICD-10-CM | POA: Diagnosis not present

## 2023-02-02 DIAGNOSIS — R531 Weakness: Secondary | ICD-10-CM | POA: Diagnosis not present

## 2023-02-02 DIAGNOSIS — F339 Major depressive disorder, recurrent, unspecified: Secondary | ICD-10-CM | POA: Diagnosis not present

## 2023-02-02 DIAGNOSIS — M6281 Muscle weakness (generalized): Secondary | ICD-10-CM | POA: Diagnosis not present

## 2023-02-02 DIAGNOSIS — E46 Unspecified protein-calorie malnutrition: Secondary | ICD-10-CM | POA: Diagnosis not present

## 2023-02-02 DIAGNOSIS — I10 Essential (primary) hypertension: Secondary | ICD-10-CM | POA: Diagnosis not present

## 2023-02-02 DIAGNOSIS — E785 Hyperlipidemia, unspecified: Secondary | ICD-10-CM | POA: Diagnosis not present

## 2023-02-02 DIAGNOSIS — F411 Generalized anxiety disorder: Secondary | ICD-10-CM | POA: Diagnosis not present

## 2023-02-02 DIAGNOSIS — R262 Difficulty in walking, not elsewhere classified: Secondary | ICD-10-CM | POA: Diagnosis not present

## 2023-02-02 DIAGNOSIS — J439 Emphysema, unspecified: Secondary | ICD-10-CM | POA: Diagnosis not present

## 2023-02-02 DIAGNOSIS — F0390 Unspecified dementia without behavioral disturbance: Secondary | ICD-10-CM | POA: Diagnosis not present

## 2023-02-05 DIAGNOSIS — F339 Major depressive disorder, recurrent, unspecified: Secondary | ICD-10-CM | POA: Diagnosis not present

## 2023-02-05 DIAGNOSIS — G4700 Insomnia, unspecified: Secondary | ICD-10-CM | POA: Diagnosis not present

## 2023-02-05 DIAGNOSIS — F0390 Unspecified dementia without behavioral disturbance: Secondary | ICD-10-CM | POA: Diagnosis not present

## 2023-02-05 DIAGNOSIS — F411 Generalized anxiety disorder: Secondary | ICD-10-CM | POA: Diagnosis not present

## 2023-02-06 DIAGNOSIS — F0390 Unspecified dementia without behavioral disturbance: Secondary | ICD-10-CM | POA: Diagnosis not present

## 2023-02-06 DIAGNOSIS — E785 Hyperlipidemia, unspecified: Secondary | ICD-10-CM | POA: Diagnosis not present

## 2023-02-06 DIAGNOSIS — F339 Major depressive disorder, recurrent, unspecified: Secondary | ICD-10-CM | POA: Diagnosis not present

## 2023-02-06 DIAGNOSIS — S6291XD Unspecified fracture of right wrist and hand, subsequent encounter for fracture with routine healing: Secondary | ICD-10-CM | POA: Diagnosis not present

## 2023-02-06 DIAGNOSIS — W19XXXD Unspecified fall, subsequent encounter: Secondary | ICD-10-CM | POA: Diagnosis not present

## 2023-02-06 DIAGNOSIS — F411 Generalized anxiety disorder: Secondary | ICD-10-CM | POA: Diagnosis not present

## 2023-02-06 DIAGNOSIS — E46 Unspecified protein-calorie malnutrition: Secondary | ICD-10-CM | POA: Diagnosis not present

## 2023-02-07 DIAGNOSIS — E46 Unspecified protein-calorie malnutrition: Secondary | ICD-10-CM | POA: Diagnosis not present

## 2023-02-07 DIAGNOSIS — S6291XD Unspecified fracture of right wrist and hand, subsequent encounter for fracture with routine healing: Secondary | ICD-10-CM | POA: Diagnosis not present

## 2023-02-07 DIAGNOSIS — F0390 Unspecified dementia without behavioral disturbance: Secondary | ICD-10-CM | POA: Diagnosis not present

## 2023-02-07 DIAGNOSIS — F411 Generalized anxiety disorder: Secondary | ICD-10-CM | POA: Diagnosis not present

## 2023-02-07 DIAGNOSIS — W19XXXD Unspecified fall, subsequent encounter: Secondary | ICD-10-CM | POA: Diagnosis not present

## 2023-02-07 DIAGNOSIS — E785 Hyperlipidemia, unspecified: Secondary | ICD-10-CM | POA: Diagnosis not present

## 2023-02-12 DIAGNOSIS — F0390 Unspecified dementia without behavioral disturbance: Secondary | ICD-10-CM | POA: Diagnosis not present

## 2023-02-12 DIAGNOSIS — G4700 Insomnia, unspecified: Secondary | ICD-10-CM | POA: Diagnosis not present

## 2023-02-12 DIAGNOSIS — F339 Major depressive disorder, recurrent, unspecified: Secondary | ICD-10-CM | POA: Diagnosis not present

## 2023-02-12 DIAGNOSIS — F411 Generalized anxiety disorder: Secondary | ICD-10-CM | POA: Diagnosis not present

## 2023-02-13 DIAGNOSIS — E46 Unspecified protein-calorie malnutrition: Secondary | ICD-10-CM | POA: Diagnosis not present

## 2023-02-13 DIAGNOSIS — W19XXXD Unspecified fall, subsequent encounter: Secondary | ICD-10-CM | POA: Diagnosis not present

## 2023-02-13 DIAGNOSIS — E785 Hyperlipidemia, unspecified: Secondary | ICD-10-CM | POA: Diagnosis not present

## 2023-02-13 DIAGNOSIS — F411 Generalized anxiety disorder: Secondary | ICD-10-CM | POA: Diagnosis not present

## 2023-02-13 DIAGNOSIS — F0390 Unspecified dementia without behavioral disturbance: Secondary | ICD-10-CM | POA: Diagnosis not present

## 2023-02-13 DIAGNOSIS — S6291XD Unspecified fracture of right wrist and hand, subsequent encounter for fracture with routine healing: Secondary | ICD-10-CM | POA: Diagnosis not present

## 2023-02-20 DIAGNOSIS — E785 Hyperlipidemia, unspecified: Secondary | ICD-10-CM | POA: Diagnosis not present

## 2023-02-20 DIAGNOSIS — F411 Generalized anxiety disorder: Secondary | ICD-10-CM | POA: Diagnosis not present

## 2023-02-20 DIAGNOSIS — S6291XD Unspecified fracture of right wrist and hand, subsequent encounter for fracture with routine healing: Secondary | ICD-10-CM | POA: Diagnosis not present

## 2023-02-20 DIAGNOSIS — W19XXXD Unspecified fall, subsequent encounter: Secondary | ICD-10-CM | POA: Diagnosis not present

## 2023-02-20 DIAGNOSIS — E46 Unspecified protein-calorie malnutrition: Secondary | ICD-10-CM | POA: Diagnosis not present

## 2023-02-20 DIAGNOSIS — F0390 Unspecified dementia without behavioral disturbance: Secondary | ICD-10-CM | POA: Diagnosis not present

## 2023-02-25 DIAGNOSIS — F411 Generalized anxiety disorder: Secondary | ICD-10-CM | POA: Diagnosis not present

## 2023-02-25 DIAGNOSIS — F339 Major depressive disorder, recurrent, unspecified: Secondary | ICD-10-CM | POA: Diagnosis not present

## 2023-02-26 DIAGNOSIS — G4700 Insomnia, unspecified: Secondary | ICD-10-CM | POA: Diagnosis not present

## 2023-02-26 DIAGNOSIS — F411 Generalized anxiety disorder: Secondary | ICD-10-CM | POA: Diagnosis not present

## 2023-02-26 DIAGNOSIS — F339 Major depressive disorder, recurrent, unspecified: Secondary | ICD-10-CM | POA: Diagnosis not present

## 2023-02-26 DIAGNOSIS — F0390 Unspecified dementia without behavioral disturbance: Secondary | ICD-10-CM | POA: Diagnosis not present

## 2023-02-27 DIAGNOSIS — E785 Hyperlipidemia, unspecified: Secondary | ICD-10-CM | POA: Diagnosis not present

## 2023-02-27 DIAGNOSIS — W19XXXD Unspecified fall, subsequent encounter: Secondary | ICD-10-CM | POA: Diagnosis not present

## 2023-02-27 DIAGNOSIS — S6291XD Unspecified fracture of right wrist and hand, subsequent encounter for fracture with routine healing: Secondary | ICD-10-CM | POA: Diagnosis not present

## 2023-02-27 DIAGNOSIS — F411 Generalized anxiety disorder: Secondary | ICD-10-CM | POA: Diagnosis not present

## 2023-02-27 DIAGNOSIS — F0390 Unspecified dementia without behavioral disturbance: Secondary | ICD-10-CM | POA: Diagnosis not present

## 2023-02-27 DIAGNOSIS — E46 Unspecified protein-calorie malnutrition: Secondary | ICD-10-CM | POA: Diagnosis not present

## 2023-02-28 DIAGNOSIS — E46 Unspecified protein-calorie malnutrition: Secondary | ICD-10-CM | POA: Diagnosis not present

## 2023-02-28 DIAGNOSIS — S6291XD Unspecified fracture of right wrist and hand, subsequent encounter for fracture with routine healing: Secondary | ICD-10-CM | POA: Diagnosis not present

## 2023-02-28 DIAGNOSIS — E785 Hyperlipidemia, unspecified: Secondary | ICD-10-CM | POA: Diagnosis not present

## 2023-02-28 DIAGNOSIS — F411 Generalized anxiety disorder: Secondary | ICD-10-CM | POA: Diagnosis not present

## 2023-02-28 DIAGNOSIS — W19XXXD Unspecified fall, subsequent encounter: Secondary | ICD-10-CM | POA: Diagnosis not present

## 2023-02-28 DIAGNOSIS — F0390 Unspecified dementia without behavioral disturbance: Secondary | ICD-10-CM | POA: Diagnosis not present

## 2023-03-04 DIAGNOSIS — R262 Difficulty in walking, not elsewhere classified: Secondary | ICD-10-CM | POA: Diagnosis not present

## 2023-03-04 DIAGNOSIS — M6281 Muscle weakness (generalized): Secondary | ICD-10-CM | POA: Diagnosis not present

## 2023-03-04 DIAGNOSIS — F0390 Unspecified dementia without behavioral disturbance: Secondary | ICD-10-CM | POA: Diagnosis not present

## 2023-03-04 DIAGNOSIS — J439 Emphysema, unspecified: Secondary | ICD-10-CM | POA: Diagnosis not present

## 2023-03-04 DIAGNOSIS — S6291XD Unspecified fracture of right wrist and hand, subsequent encounter for fracture with routine healing: Secondary | ICD-10-CM | POA: Diagnosis not present

## 2023-03-04 DIAGNOSIS — H543 Unqualified visual loss, both eyes: Secondary | ICD-10-CM | POA: Diagnosis not present

## 2023-03-04 DIAGNOSIS — W19XXXD Unspecified fall, subsequent encounter: Secondary | ICD-10-CM | POA: Diagnosis not present

## 2023-03-04 DIAGNOSIS — E46 Unspecified protein-calorie malnutrition: Secondary | ICD-10-CM | POA: Diagnosis not present

## 2023-03-04 DIAGNOSIS — E785 Hyperlipidemia, unspecified: Secondary | ICD-10-CM | POA: Diagnosis not present

## 2023-03-04 DIAGNOSIS — R531 Weakness: Secondary | ICD-10-CM | POA: Diagnosis not present

## 2023-03-04 DIAGNOSIS — I10 Essential (primary) hypertension: Secondary | ICD-10-CM | POA: Diagnosis not present

## 2023-03-04 DIAGNOSIS — F411 Generalized anxiety disorder: Secondary | ICD-10-CM | POA: Diagnosis not present

## 2023-03-04 DIAGNOSIS — F339 Major depressive disorder, recurrent, unspecified: Secondary | ICD-10-CM | POA: Diagnosis not present

## 2023-03-06 DIAGNOSIS — F411 Generalized anxiety disorder: Secondary | ICD-10-CM | POA: Diagnosis not present

## 2023-03-06 DIAGNOSIS — E785 Hyperlipidemia, unspecified: Secondary | ICD-10-CM | POA: Diagnosis not present

## 2023-03-06 DIAGNOSIS — E46 Unspecified protein-calorie malnutrition: Secondary | ICD-10-CM | POA: Diagnosis not present

## 2023-03-06 DIAGNOSIS — S6291XD Unspecified fracture of right wrist and hand, subsequent encounter for fracture with routine healing: Secondary | ICD-10-CM | POA: Diagnosis not present

## 2023-03-06 DIAGNOSIS — F0390 Unspecified dementia without behavioral disturbance: Secondary | ICD-10-CM | POA: Diagnosis not present

## 2023-03-06 DIAGNOSIS — W19XXXD Unspecified fall, subsequent encounter: Secondary | ICD-10-CM | POA: Diagnosis not present

## 2023-03-10 DIAGNOSIS — H548 Legal blindness, as defined in USA: Secondary | ICD-10-CM | POA: Diagnosis not present

## 2023-03-10 DIAGNOSIS — H353133 Nonexudative age-related macular degeneration, bilateral, advanced atrophic without subfoveal involvement: Secondary | ICD-10-CM | POA: Diagnosis not present

## 2023-03-10 DIAGNOSIS — Z961 Presence of intraocular lens: Secondary | ICD-10-CM | POA: Diagnosis not present

## 2023-03-11 DIAGNOSIS — F0390 Unspecified dementia without behavioral disturbance: Secondary | ICD-10-CM | POA: Diagnosis not present

## 2023-03-11 DIAGNOSIS — W19XXXD Unspecified fall, subsequent encounter: Secondary | ICD-10-CM | POA: Diagnosis not present

## 2023-03-11 DIAGNOSIS — F411 Generalized anxiety disorder: Secondary | ICD-10-CM | POA: Diagnosis not present

## 2023-03-11 DIAGNOSIS — E46 Unspecified protein-calorie malnutrition: Secondary | ICD-10-CM | POA: Diagnosis not present

## 2023-03-11 DIAGNOSIS — S6291XD Unspecified fracture of right wrist and hand, subsequent encounter for fracture with routine healing: Secondary | ICD-10-CM | POA: Diagnosis not present

## 2023-03-11 DIAGNOSIS — E785 Hyperlipidemia, unspecified: Secondary | ICD-10-CM | POA: Diagnosis not present

## 2023-03-12 DIAGNOSIS — F0390 Unspecified dementia without behavioral disturbance: Secondary | ICD-10-CM | POA: Diagnosis not present

## 2023-03-12 DIAGNOSIS — G4700 Insomnia, unspecified: Secondary | ICD-10-CM | POA: Diagnosis not present

## 2023-03-12 DIAGNOSIS — F339 Major depressive disorder, recurrent, unspecified: Secondary | ICD-10-CM | POA: Diagnosis not present

## 2023-03-12 DIAGNOSIS — F411 Generalized anxiety disorder: Secondary | ICD-10-CM | POA: Diagnosis not present

## 2023-03-13 DIAGNOSIS — I7091 Generalized atherosclerosis: Secondary | ICD-10-CM | POA: Diagnosis not present

## 2023-03-13 DIAGNOSIS — B351 Tinea unguium: Secondary | ICD-10-CM | POA: Diagnosis not present

## 2023-03-13 DIAGNOSIS — E785 Hyperlipidemia, unspecified: Secondary | ICD-10-CM | POA: Diagnosis not present

## 2023-03-13 DIAGNOSIS — E46 Unspecified protein-calorie malnutrition: Secondary | ICD-10-CM | POA: Diagnosis not present

## 2023-03-13 DIAGNOSIS — F0390 Unspecified dementia without behavioral disturbance: Secondary | ICD-10-CM | POA: Diagnosis not present

## 2023-03-13 DIAGNOSIS — F411 Generalized anxiety disorder: Secondary | ICD-10-CM | POA: Diagnosis not present

## 2023-03-13 DIAGNOSIS — S6291XD Unspecified fracture of right wrist and hand, subsequent encounter for fracture with routine healing: Secondary | ICD-10-CM | POA: Diagnosis not present

## 2023-03-13 DIAGNOSIS — W19XXXD Unspecified fall, subsequent encounter: Secondary | ICD-10-CM | POA: Diagnosis not present

## 2023-03-18 DIAGNOSIS — F411 Generalized anxiety disorder: Secondary | ICD-10-CM | POA: Diagnosis not present

## 2023-03-18 DIAGNOSIS — F339 Major depressive disorder, recurrent, unspecified: Secondary | ICD-10-CM | POA: Diagnosis not present

## 2023-03-20 DIAGNOSIS — F0390 Unspecified dementia without behavioral disturbance: Secondary | ICD-10-CM | POA: Diagnosis not present

## 2023-03-20 DIAGNOSIS — F411 Generalized anxiety disorder: Secondary | ICD-10-CM | POA: Diagnosis not present

## 2023-03-20 DIAGNOSIS — E785 Hyperlipidemia, unspecified: Secondary | ICD-10-CM | POA: Diagnosis not present

## 2023-03-20 DIAGNOSIS — S6291XD Unspecified fracture of right wrist and hand, subsequent encounter for fracture with routine healing: Secondary | ICD-10-CM | POA: Diagnosis not present

## 2023-03-20 DIAGNOSIS — E46 Unspecified protein-calorie malnutrition: Secondary | ICD-10-CM | POA: Diagnosis not present

## 2023-03-20 DIAGNOSIS — W19XXXD Unspecified fall, subsequent encounter: Secondary | ICD-10-CM | POA: Diagnosis not present

## 2023-03-25 DIAGNOSIS — F411 Generalized anxiety disorder: Secondary | ICD-10-CM | POA: Diagnosis not present

## 2023-03-25 DIAGNOSIS — E46 Unspecified protein-calorie malnutrition: Secondary | ICD-10-CM | POA: Diagnosis not present

## 2023-03-25 DIAGNOSIS — F339 Major depressive disorder, recurrent, unspecified: Secondary | ICD-10-CM | POA: Diagnosis not present

## 2023-03-25 DIAGNOSIS — W19XXXD Unspecified fall, subsequent encounter: Secondary | ICD-10-CM | POA: Diagnosis not present

## 2023-03-25 DIAGNOSIS — S6291XD Unspecified fracture of right wrist and hand, subsequent encounter for fracture with routine healing: Secondary | ICD-10-CM | POA: Diagnosis not present

## 2023-03-25 DIAGNOSIS — F0390 Unspecified dementia without behavioral disturbance: Secondary | ICD-10-CM | POA: Diagnosis not present

## 2023-03-25 DIAGNOSIS — E785 Hyperlipidemia, unspecified: Secondary | ICD-10-CM | POA: Diagnosis not present

## 2023-04-04 DEATH — deceased
